# Patient Record
Sex: Female | Born: 1985 | Race: Asian | Hispanic: No | Marital: Single | Smoking: Never smoker
Health system: Southern US, Community
[De-identification: ages and names within clinical notes are randomized; demographics above are authoritative.]

## PROBLEM LIST (undated history)

## (undated) DIAGNOSIS — Z789 Other specified health status: Secondary | ICD-10-CM

## (undated) DIAGNOSIS — O24419 Gestational diabetes mellitus in pregnancy, unspecified control: Secondary | ICD-10-CM

## (undated) HISTORY — DX: Gestational diabetes mellitus in pregnancy, unspecified control: O24.419

## (undated) HISTORY — PX: BREAST SURGERY: SHX581

## (undated) HISTORY — DX: Other specified health status: Z78.9

---

## 2010-06-11 ENCOUNTER — Other Ambulatory Visit: Payer: Self-pay | Admitting: Infectious Diseases

## 2010-06-12 ENCOUNTER — Other Ambulatory Visit: Payer: Self-pay | Admitting: Infectious Diseases

## 2010-06-12 ENCOUNTER — Ambulatory Visit
Admission: RE | Admit: 2010-06-12 | Discharge: 2010-06-12 | Disposition: A | Discharge status: Home / Self Care | Payer: No Typology Code available for payment source | Source: Ambulatory Visit | Attending: Infectious Diseases | Admitting: Infectious Diseases

## 2010-06-12 DIAGNOSIS — R7611 Nonspecific reaction to tuberculin skin test without active tuberculosis: Secondary | ICD-10-CM

## 2010-06-12 IMAGING — CR DG CHEST 2V
2 series · 2 of 2 positions shown · non-contrast
Comparison: [DATE]

CLINICAL DATA: Positive PPD; history of abnormal chest x-ray

CHEST - 2 VIEW

[w chest pa]
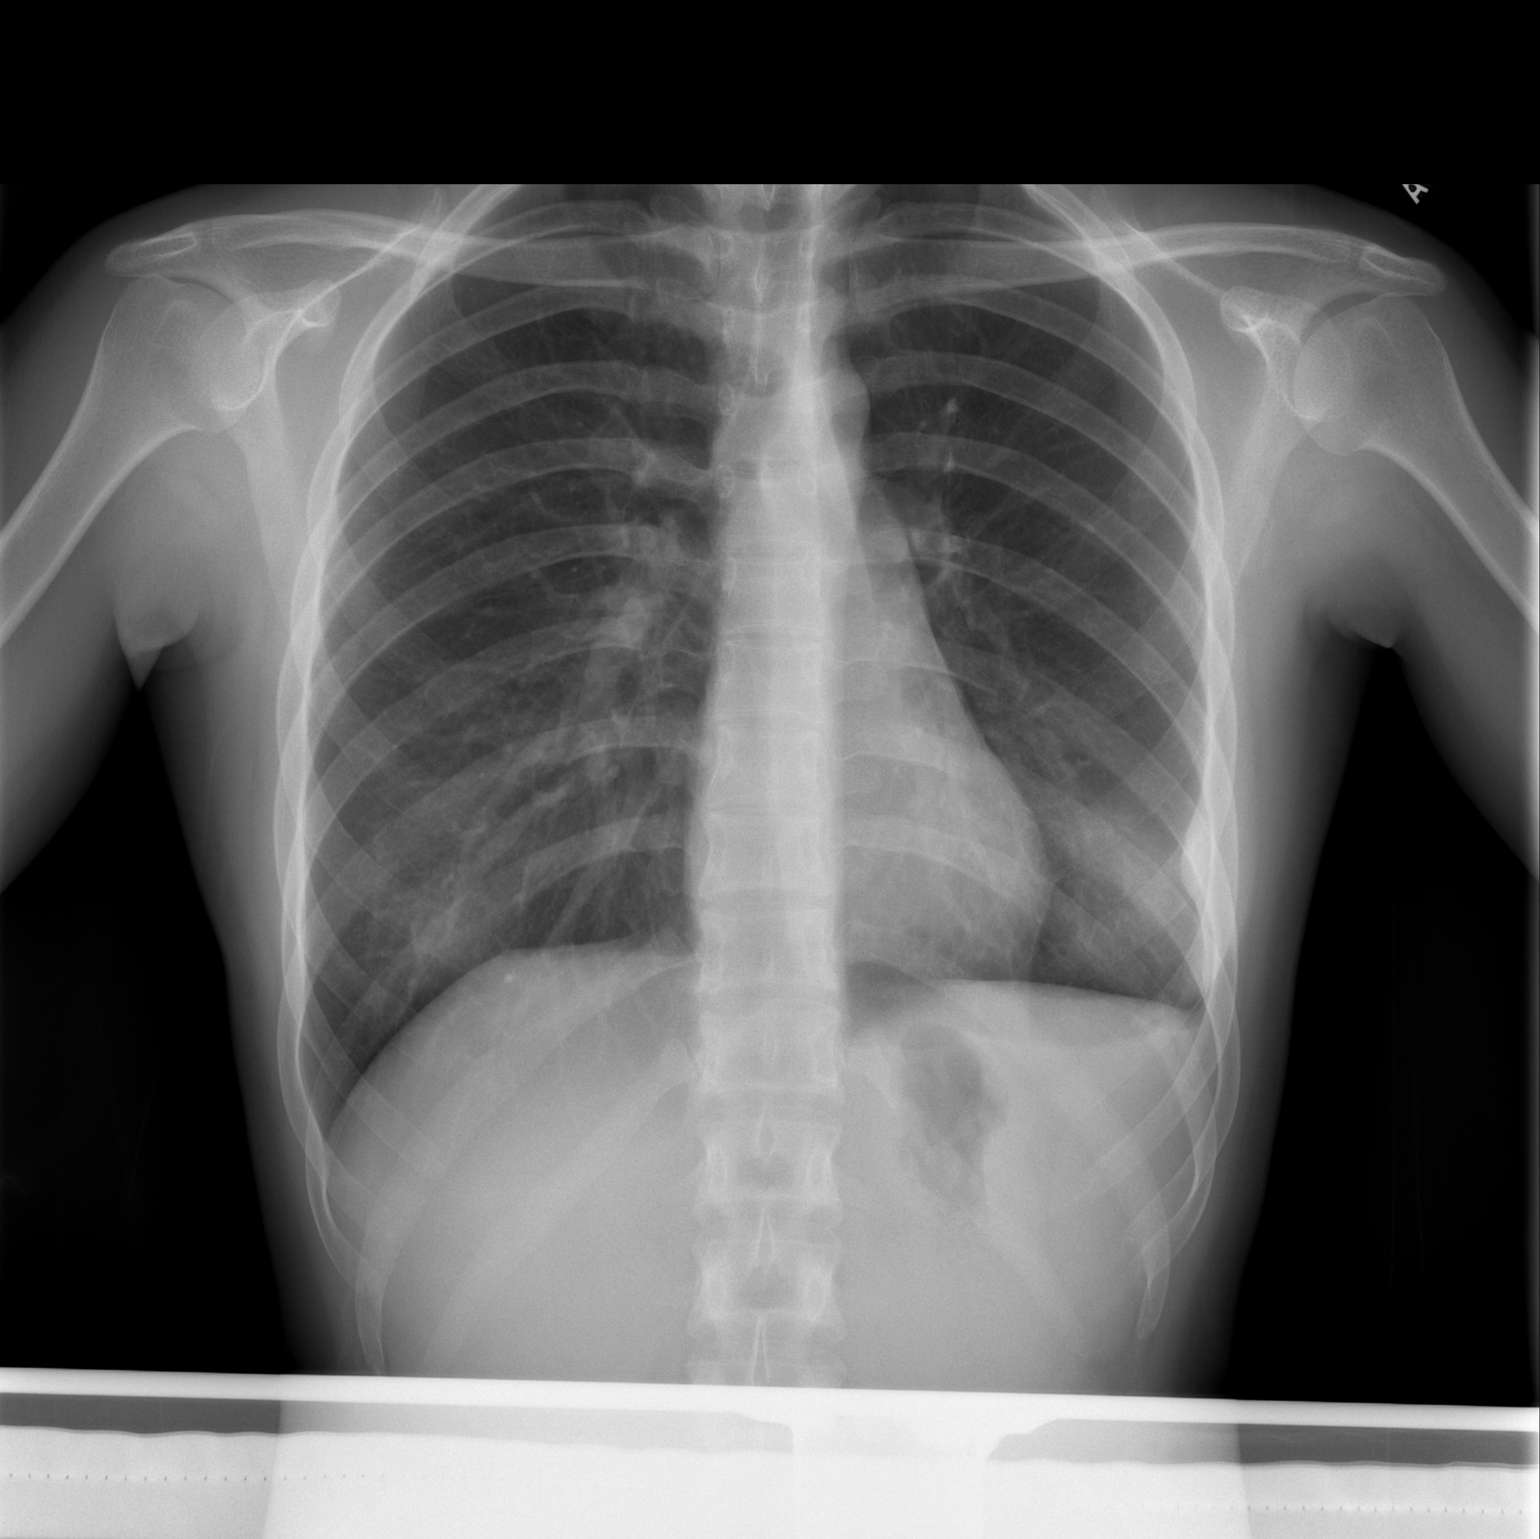

[w chest lat]
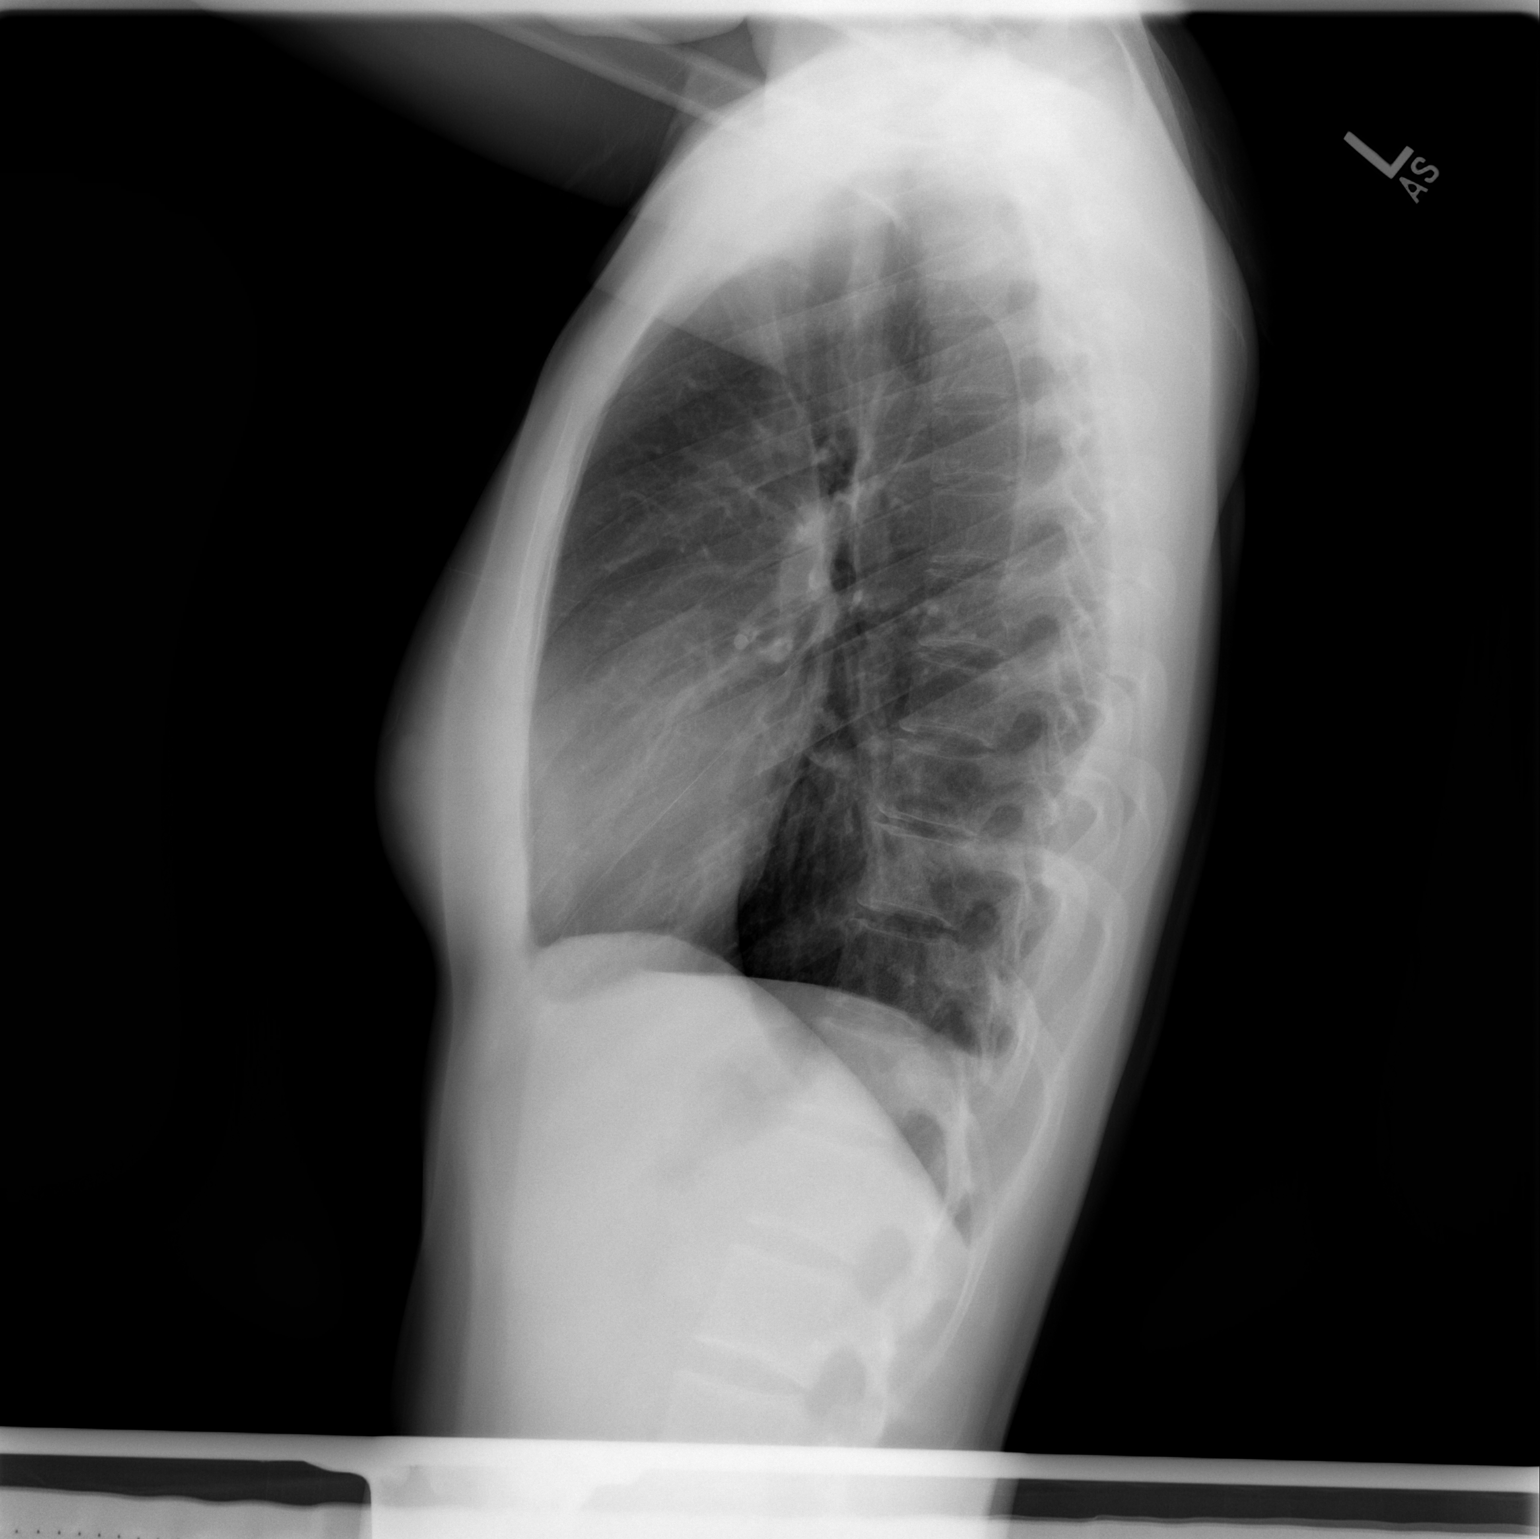

[2 of 2 positions shown; findings below may reference images not displayed]

FINDINGS: The cardiac silhouette, mediastinum, pulmonary
vasculature are within normal limits.  Both lungs are clear.  There
is minimal, stable blunting of the left costophrenic angle. There
is no acute bony abnormality.
IMPRESSION: There is no evidence of acute cardiac or pulmonary process.  The
stable blunting at the left costophrenic angle likely represents
chronic scarring.

## 2010-07-18 ENCOUNTER — Inpatient Hospital Stay (INDEPENDENT_AMBULATORY_CARE_PROVIDER_SITE_OTHER)
Admission: RE | Admit: 2010-07-18 | Discharge: 2010-07-18 | Disposition: A | Discharge status: Home / Self Care | Payer: Medicaid Other | Source: Ambulatory Visit | Attending: Family Medicine | Admitting: Family Medicine

## 2010-07-18 DIAGNOSIS — R6889 Other general symptoms and signs: Secondary | ICD-10-CM

## 2010-07-18 DIAGNOSIS — H9209 Otalgia, unspecified ear: Secondary | ICD-10-CM

## 2011-06-30 ENCOUNTER — Encounter (HOSPITAL_COMMUNITY): Payer: Self-pay | Admitting: *Deleted

## 2011-06-30 ENCOUNTER — Emergency Department (HOSPITAL_COMMUNITY): Payer: Self-pay

## 2011-06-30 ENCOUNTER — Emergency Department (HOSPITAL_COMMUNITY)
Admission: EM | Admit: 2011-06-30 | Discharge: 2011-06-30 | Disposition: A | Discharge status: Home / Self Care | Payer: Self-pay | Attending: Emergency Medicine | Admitting: Emergency Medicine

## 2011-06-30 DIAGNOSIS — R110 Nausea: Secondary | ICD-10-CM | POA: Insufficient documentation

## 2011-06-30 DIAGNOSIS — R51 Headache: Secondary | ICD-10-CM | POA: Insufficient documentation

## 2011-06-30 DIAGNOSIS — H53149 Visual discomfort, unspecified: Secondary | ICD-10-CM | POA: Insufficient documentation

## 2011-06-30 LAB — CBC
MCH: 30.2 pg (ref 26.0–34.0)
MCV: 91 fL (ref 78.0–100.0)
Platelets: 181 10*3/uL (ref 150–400)
RBC: 3.78 MIL/uL — ABNORMAL LOW (ref 3.87–5.11)
RDW: 12.1 % (ref 11.5–15.5)
WBC: 10.9 10*3/uL — ABNORMAL HIGH (ref 4.0–10.5)

## 2011-06-30 LAB — DIFFERENTIAL
Basophils Absolute: 0 10*3/uL (ref 0.0–0.1)
Eosinophils Absolute: 0.1 10*3/uL (ref 0.0–0.7)
Eosinophils Relative: 1 % (ref 0–5)
Lymphocytes Relative: 24 % (ref 12–46)
Lymphs Abs: 2.6 10*3/uL (ref 0.7–4.0)
Neutrophils Relative %: 69 % (ref 43–77)

## 2011-06-30 LAB — BASIC METABOLIC PANEL
Calcium: 8.6 mg/dL (ref 8.4–10.5)
GFR calc non Af Amer: >90 mL/min (ref >90)
Potassium: 3.4 mEq/L — ABNORMAL LOW (ref 3.5–5.1)
Sodium: 138 mEq/L (ref 135–145)

## 2011-06-30 IMAGING — CT CT HEAD W/O CM
1 series · 16 of 30 positions shown, 20 images · non-contrast
Comparison: None

CLINICAL DATA: 25-year-old female with headache.

CT HEAD WITHOUT CONTRAST
TECHNIQUE: Contiguous axial images were obtained from the base of
the skull through the vertex without contrast.

[Series 2: head routine 4.8 h37s · axial · 0.42mm/px · z∈[-124,+9]mm · 16 of 30 slices shown, 20 images]
[im 2/30  brain]
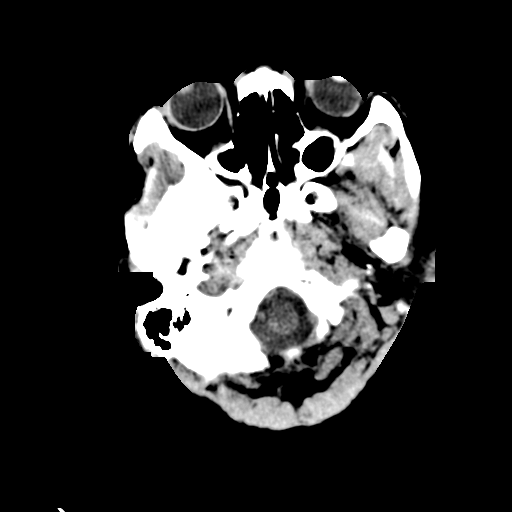
[im 2/30  bone]
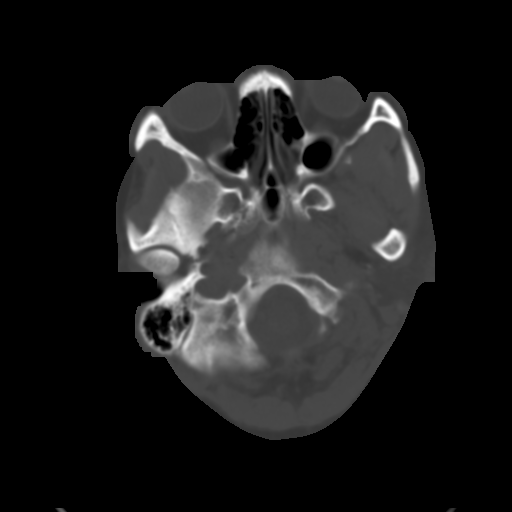
[im 4/30  brain]
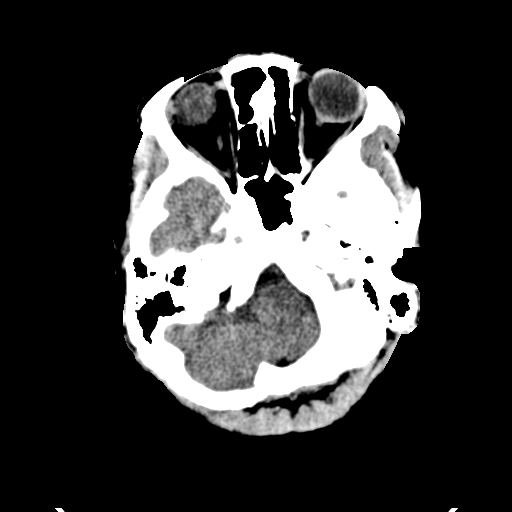
[im 6/30  brain]
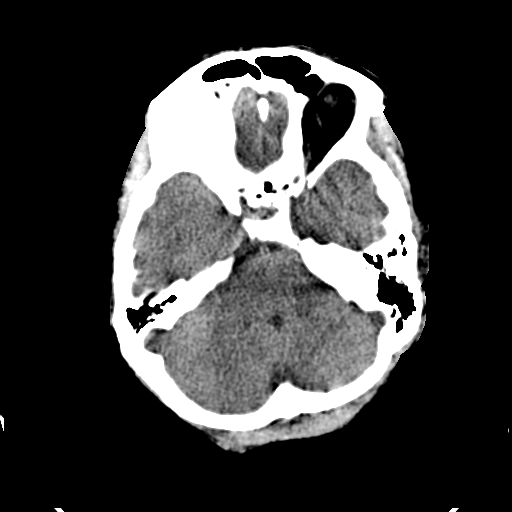
[im 8/30  brain]
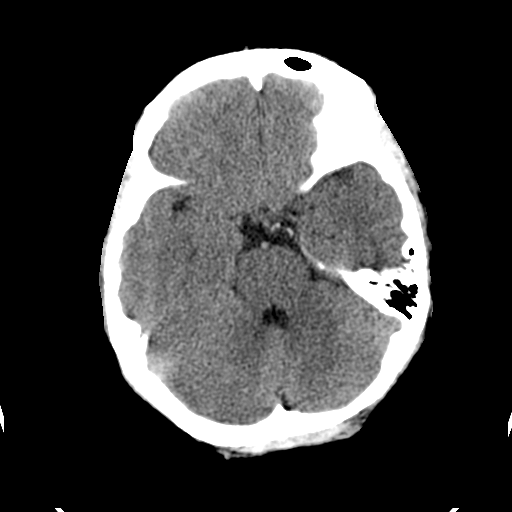
[im 9/30  brain]
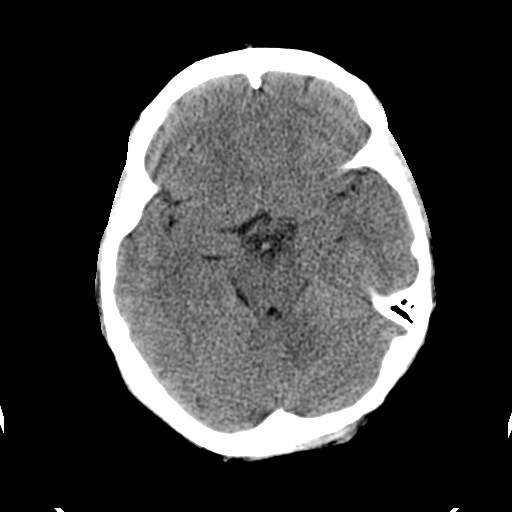
[im 9/30  bone]
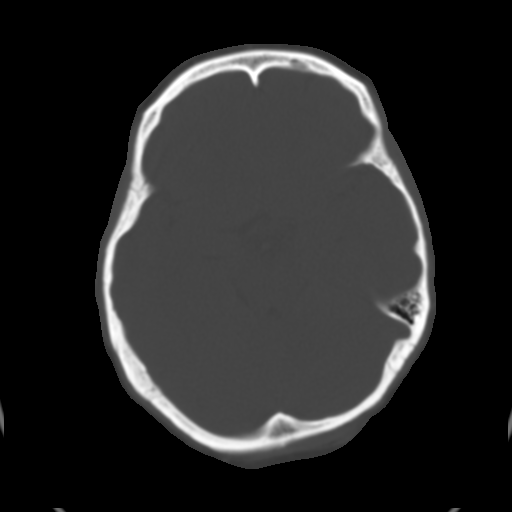
[im 11/30  brain]
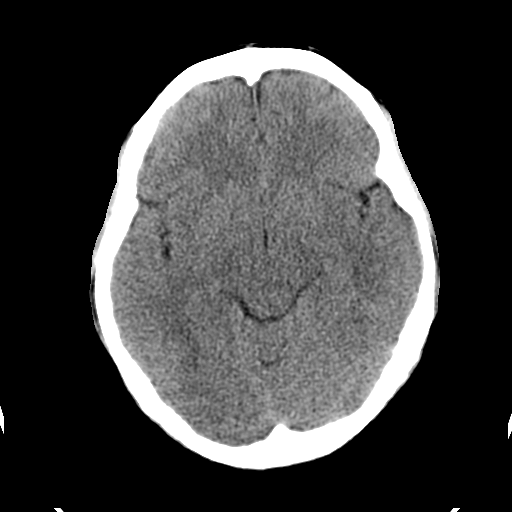
[im 13/30  brain]
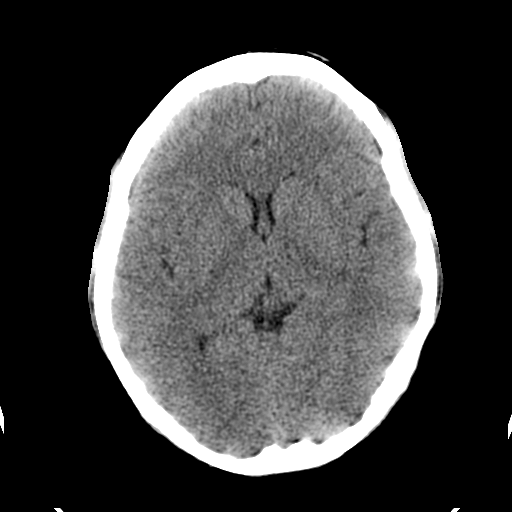
[im 15/30  brain]
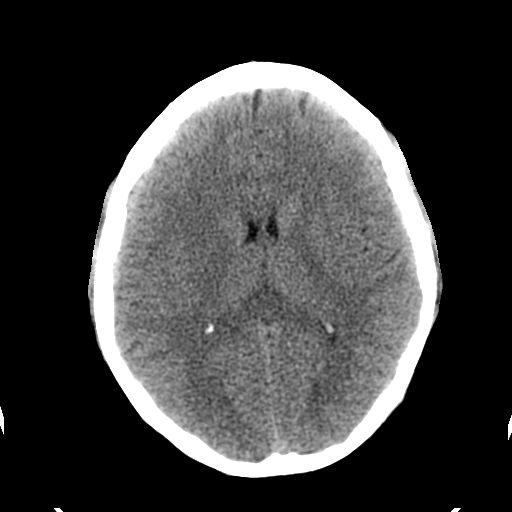
[im 16/30  brain]
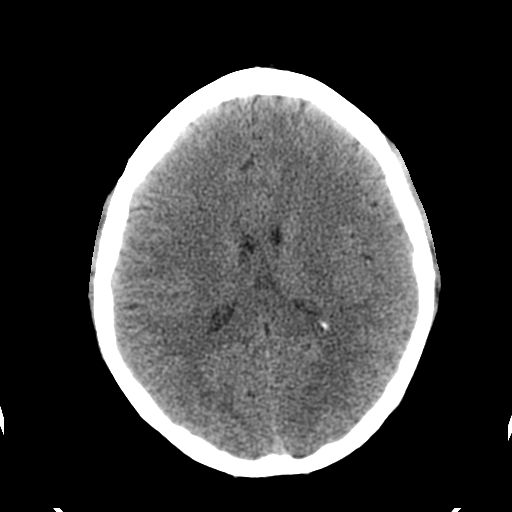
[im 16/30  bone]
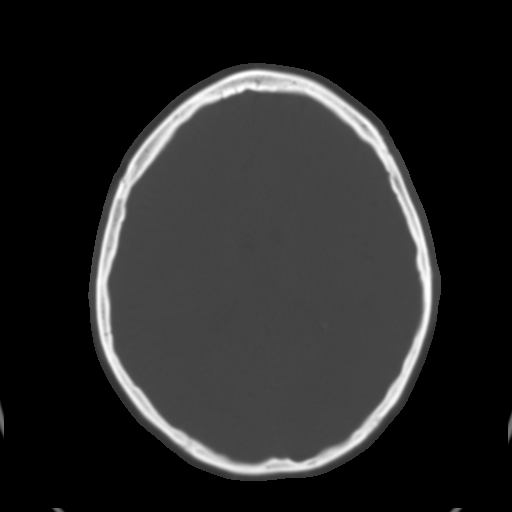
[im 18/30  brain]
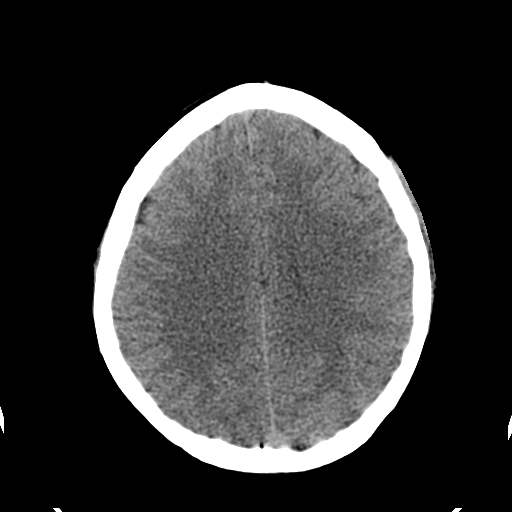
[im 20/30  brain]
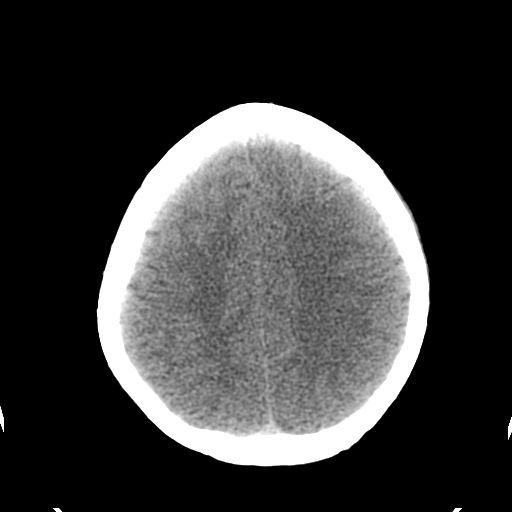
[im 22/30  brain]
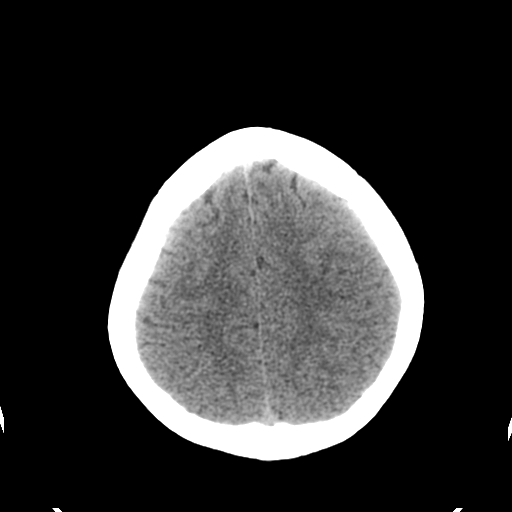
[im 23/30  brain]
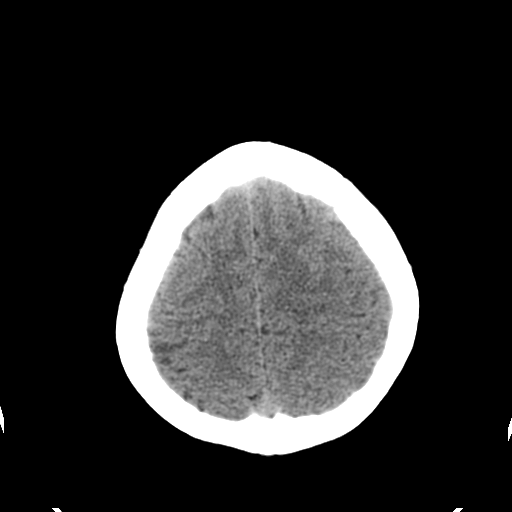
[im 23/30  bone]
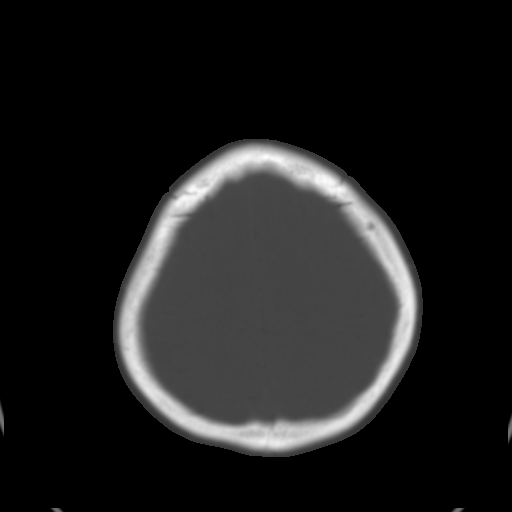
[im 25/30  brain]
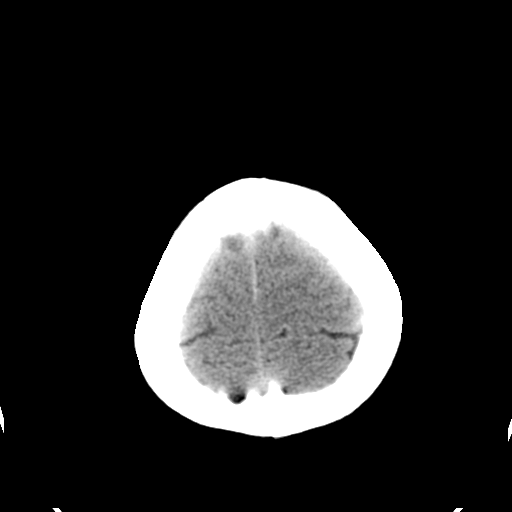
[im 27/30  brain]
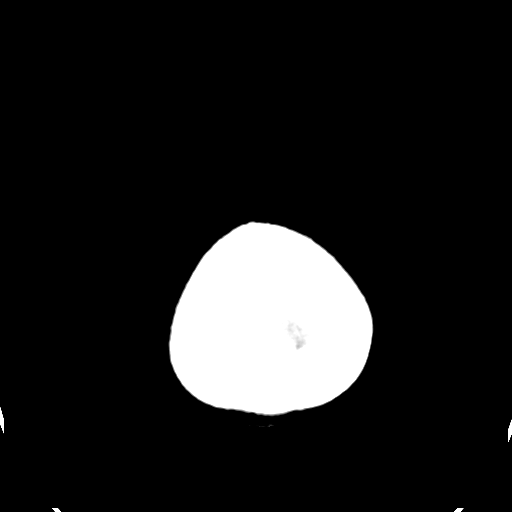
[im 29/30  brain]
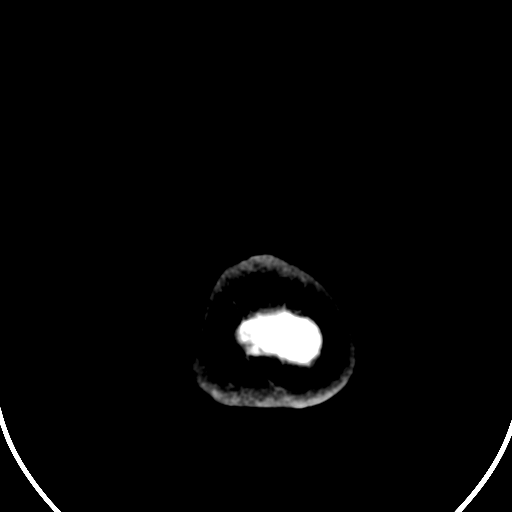

[16 of 30 positions shown; findings below may reference images not displayed]

FINDINGS: No intracranial abnormalities are identified, including
mass lesion or mass effect, hydrocephalus, extra-axial fluid
collection, midline shift, hemorrhage, or acute infarction.

The visualized bony calvarium is unremarkable.
IMPRESSION: Unremarkable noncontrast head CT

## 2011-06-30 MED ORDER — KETOROLAC TROMETHAMINE 30 MG/ML IJ SOLN
30.0000 mg | Freq: Once | INTRAMUSCULAR | Status: AC
  Administered 2011-06-30: 30 mg via INTRAVENOUS
  Filled 2011-06-30: qty 1

## 2011-06-30 MED ORDER — SODIUM CHLORIDE 0.9 % IV BOLUS (SEPSIS)
1000.0000 mL | Freq: Once | INTRAVENOUS | Status: AC
  Administered 2011-06-30: 1000 mL via INTRAVENOUS

## 2011-06-30 MED ORDER — SODIUM CHLORIDE 0.9 % IV BOLUS (SEPSIS): 1000.0000 mL | Freq: Once | INTRAVENOUS | Status: DC

## 2011-06-30 MED ORDER — NAPROXEN 375 MG PO TABS: 375.0000 mg | ORAL_TABLET | Freq: Two times a day (BID) | ORAL | Status: AC

## 2011-06-30 MED ORDER — DIPHENHYDRAMINE HCL 50 MG/ML IJ SOLN
25.0000 mg | Freq: Once | INTRAMUSCULAR | Status: AC
  Administered 2011-06-30: 25 mg via INTRAVENOUS
  Filled 2011-06-30: qty 1

## 2011-06-30 MED ORDER — METOCLOPRAMIDE HCL 5 MG/ML IJ SOLN
10.0000 mg | Freq: Once | INTRAMUSCULAR | Status: AC
  Administered 2011-06-30: 10 mg via INTRAVENOUS
  Filled 2011-06-30: qty 2

## 2011-06-30 NOTE — ED Provider Notes (Signed)
History     CSN: [id number]  Arrival date & time 06/30/11  0226   First MD Initiated Contact with Patient 06/30/11 [phone]      Chief Complaint  Patient presents with  . Headache    (Consider location/radiation/quality/duration/timing/severity/associated sxs/prior treatment) HPI Comments: Patient with no significant medical history presents emergency department with chief complaint of headache.  Patient does not normally suffer headaches onset of headache was yesterday at around 4 p.m. in the afternoon and was gradual in progression.  Pain is located in in temporal regions as well as cerebellar regions.  Severity rated as 10 out of 10 associated symptoms including photophobia, nausea but patient denies change in vision, ataxia, disequilibrium, weakness, vomiting, nuchal rigidity, recent illness,URI,cough, fever, night sweats or chills. Pt is from [place], but has been in the [place] for > 1 year with out recent travel. Translation was done by pts aunt.   Patient is a 26 y.o. female presenting with headaches. The history is provided by the patient, a parent and a relative. The history is limited by a language barrier. A language interpreter was used (family member).  Headache  Pertinent negatives include no fever and no shortness of breath.    History reviewed. No pertinent past medical history.  History reviewed. No pertinent past surgical history.  No family history on file.  History  Substance Use Topics  . Smoking status: Never Smoker   . Smokeless tobacco: Not on file  . Alcohol Use: No    OB History    Grav Para Term Preterm Abortions TAB SAB Ect Mult Living                  Review of Systems  Constitutional: Negative for fever, chills and appetite change.  HENT: Negative for congestion, facial swelling, neck pain and neck stiffness.   Eyes: Positive for photophobia. Negative for pain and visual disturbance.  Respiratory: Negative for shortness of breath.   Cardiovascular:  Negative for chest pain and leg swelling.  Gastrointestinal: Negative for abdominal pain.  Genitourinary: Negative for dysuria, urgency and frequency.  Neurological: Positive for headaches. Negative for dizziness, syncope, weakness, light-headedness and numbness.  Psychiatric/Behavioral: Negative for confusion.  All other systems reviewed and are negative.    Allergies  Review of patient's allergies indicates no known allergies.  Home Medications   Current Outpatient Rx  Name Route Sig Dispense Refill  . ACETAMINOPHEN 500 MG PO TABS Oral Take 1,000 mg by mouth every 6 (six) hours as needed. For pain      BP 88/58  Pulse 96  Temp(Src) 97.4 F (36.3 C) (Oral)  Resp 16  SpO2 95%  Physical Exam  Nursing note and vitals reviewed. Constitutional: She is oriented to person, place, and time. She appears well-developed and well-nourished. No distress.  HENT:  Head: Normocephalic and atraumatic.  Eyes: Conjunctivae and EOM are normal. Pupils are equal, round, and reactive to light. No scleral icterus.  Neck: Normal range of motion and full passive range of motion without pain. Neck supple. No JVD present. Carotid bruit is not present. No rigidity. No Brudzinski's sign noted.  Cardiovascular: Normal rate, regular rhythm, normal heart sounds and intact distal pulses.   Pulmonary/Chest: Effort normal and breath sounds normal. No respiratory distress. She has no wheezes. She has no rales.  Musculoskeletal: Normal range of motion.  Lymphadenopathy:    She has no cervical adenopathy.  Neurological: She is alert and oriented to person, place, and time. She has normal strength.  No cranial nerve deficit or sensory deficit. She displays a negative Romberg sign. Coordination and gait normal. GCS eye subscore is 4. GCS verbal subscore is 5. GCS motor subscore is 6.       A&O x3.  Able to follow commands. PERRL, EOMs, no vertical or bidirectional nystagmus. Shoulder shrug, facial muscles, tongue  protrusion and swallow intact.  Motor strength 5/5 bilaterally including grip strength, triceps, hamstrings and ankle dorsiflexion. Light touch intact in all 4 distal limbs.  Intact finger to nose, shin to heel and rapid alternating movements. No ataxia or dysequilibrium.   Skin: Skin is warm and dry. No rash noted. She is not diaphoretic.  Psychiatric: She has a normal mood and affect. Her behavior is normal.    ED Course  Procedures (including critical care time)  Labs Reviewed  CBC - Abnormal; Notable for the following:    WBC 10.9 (*)    RBC 3.78 (*)    Hemoglobin 11.4 (*)    HCT 34.4 (*)    All other components within normal limits  BASIC METABOLIC PANEL - Abnormal; Notable for the following:    Potassium 3.4 (*)    All other components within normal limits  DIFFERENTIAL   Ct Head Wo Contrast  06/30/2011  *RADIOLOGY REPORT*  Clinical Data: 26 year old female with headache.  CT HEAD WITHOUT CONTRAST  Technique:  Contiguous axial images were obtained from the base of the skull through the vertex without contrast.  Comparison: None  Findings: No intracranial abnormalities are identified, including mass lesion or mass effect, hydrocephalus, extra-axial fluid collection, midline shift, hemorrhage, or acute infarction.  The visualized bony calvarium is unremarkable.  IMPRESSION: Unremarkable noncontrast head CT  Original Report Authenticated By: Rosendo Gros, M.D.     No diagnosis found.    MDM  HA  Patient to the emergency department with chief complaint of headache associated with photophobia and nausea but no recent illness or meningeal signs.  Patient given strict instructions to return to the emergency department if fever greater than 101, worsening headache, or any nuchal rigidity develops. Pt HA treated and improved while in ED.  Presentation is non concerning for [facility], ICH, Meningitis, or temporal arteritis. Pt is afebrile with no focal neuro deficits, nuchal rigidity, or  change in vision. Pt is to follow up with PCP. Pt verbalizes understanding and is agreeable with plan to dc.          Jaci Carrel, [place] 06/30/11 1030

## 2011-06-30 NOTE — Discharge Instructions (Signed)

## 2011-06-30 NOTE — ED Notes (Signed)
The pt has had a headache since last pm

## 2011-07-01 NOTE — ED Provider Notes (Signed)
Medical screening examination/treatment/procedure(s) were performed by non-physician practitioner and as supervising physician I was immediately available for consultation/collaboration.   Vida Roller, MD 07/01/11 [phone]

## 2013-12-18 ENCOUNTER — Emergency Department (HOSPITAL_COMMUNITY): Payer: Self-pay

## 2013-12-18 ENCOUNTER — Observation Stay (HOSPITAL_COMMUNITY)
Admission: EM | Admit: 2013-12-18 | Discharge: 2013-12-20 | Disposition: A | Discharge status: Home / Self Care | Payer: Medicaid Other | Attending: Internal Medicine | Admitting: Internal Medicine

## 2013-12-18 ENCOUNTER — Encounter (HOSPITAL_COMMUNITY): Payer: Self-pay | Admitting: Emergency Medicine

## 2013-12-18 DIAGNOSIS — N760 Acute vaginitis: Secondary | ICD-10-CM | POA: Insufficient documentation

## 2013-12-18 DIAGNOSIS — R112 Nausea with vomiting, unspecified: Secondary | ICD-10-CM | POA: Diagnosis present

## 2013-12-18 DIAGNOSIS — R4189 Other symptoms and signs involving cognitive functions and awareness: Secondary | ICD-10-CM

## 2013-12-18 DIAGNOSIS — R4182 Altered mental status, unspecified: Secondary | ICD-10-CM

## 2013-12-18 DIAGNOSIS — I951 Orthostatic hypotension: Secondary | ICD-10-CM | POA: Insufficient documentation

## 2013-12-18 DIAGNOSIS — Z681 Body mass index (BMI) 19 or less, adult: Secondary | ICD-10-CM | POA: Insufficient documentation

## 2013-12-18 DIAGNOSIS — R401 Stupor: Secondary | ICD-10-CM

## 2013-12-18 DIAGNOSIS — B9689 Other specified bacterial agents as the cause of diseases classified elsewhere: Secondary | ICD-10-CM | POA: Diagnosis present

## 2013-12-18 DIAGNOSIS — E440 Moderate protein-calorie malnutrition: Secondary | ICD-10-CM | POA: Insufficient documentation

## 2013-12-18 DIAGNOSIS — R5383 Other fatigue: Secondary | ICD-10-CM | POA: Insufficient documentation

## 2013-12-18 DIAGNOSIS — N925 Other specified irregular menstruation: Secondary | ICD-10-CM | POA: Insufficient documentation

## 2013-12-18 DIAGNOSIS — G9340 Encephalopathy, unspecified: Principal | ICD-10-CM | POA: Insufficient documentation

## 2013-12-18 DIAGNOSIS — R410 Disorientation, unspecified: Secondary | ICD-10-CM

## 2013-12-18 DIAGNOSIS — R1013 Epigastric pain: Secondary | ICD-10-CM | POA: Insufficient documentation

## 2013-12-18 DIAGNOSIS — N926 Irregular menstruation, unspecified: Secondary | ICD-10-CM | POA: Diagnosis present

## 2013-12-18 LAB — C-REACTIVE PROTEIN: CRP: <0.5 mg/dL — ABNORMAL LOW (ref <0.60)

## 2013-12-18 LAB — URINALYSIS, ROUTINE W REFLEX MICROSCOPIC
Bilirubin Urine: NEGATIVE
Glucose, UA: NEGATIVE mg/dL
Hgb urine dipstick: NEGATIVE
Ketones, ur: 15 mg/dL — AB
Nitrite: NEGATIVE
Protein, ur: NEGATIVE mg/dL
Specific Gravity, Urine: 1.017 (ref 1.005–1.030)
Urobilinogen, UA: 0.2 mg/dL (ref 0.0–1.0)
pH: 5.5 (ref 5.0–8.0)

## 2013-12-18 LAB — BASIC METABOLIC PANEL
Anion gap: 13 (ref 5–15)
BUN: 10 mg/dL (ref 6–23)
CALCIUM: 9.8 mg/dL (ref 8.4–10.5)
CO2: 23 mEq/L (ref 19–32)
CREATININE: 0.61 mg/dL (ref 0.50–1.10)
Chloride: 100 mEq/L (ref 96–112)
Glucose, Bld: 110 mg/dL — ABNORMAL HIGH (ref 70–99)
Potassium: 3.7 mEq/L (ref 3.7–5.3)
Sodium: 136 mEq/L — ABNORMAL LOW (ref 137–147)

## 2013-12-18 LAB — I-STAT TROPONIN, ED: TROPONIN I, POC: 0 ng/mL (ref 0.00–0.08)

## 2013-12-18 LAB — CBC
HEMATOCRIT: 41.4 % (ref 36.0–46.0)
Hemoglobin: 14.2 g/dL (ref 12.0–15.0)
MCH: 30.1 pg (ref 26.0–34.0)
MCHC: 34.3 g/dL (ref 30.0–36.0)
MCV: 87.7 fL (ref 78.0–100.0)
Platelets: 214 10*3/uL (ref 150–400)
RBC: 4.72 MIL/uL (ref 3.87–5.11)
RDW: 12 % (ref 11.5–15.5)
WBC: 10.1 10*3/uL (ref 4.0–10.5)

## 2013-12-18 LAB — I-STAT ARTERIAL BLOOD GAS, ED
Acid-Base Excess: 1 mmol/L (ref 0.0–2.0)
Bicarbonate: 25.9 mEq/L — ABNORMAL HIGH (ref 20.0–24.0)
O2 Saturation: 97 %
PCO2 ART: 41 mmHg (ref 35.0–45.0)
PH ART: 7.406 (ref 7.350–7.450)
PO2 ART: 89 mmHg (ref 80.0–100.0)
Patient temperature: 97.4
TCO2: 27 mmol/L (ref 0–100)

## 2013-12-18 LAB — HEPATIC FUNCTION PANEL
ALT: 6 U/L (ref 0–35)
AST: 12 U/L (ref 0–37)
Albumin: 4.2 g/dL (ref 3.5–5.2)
Alkaline Phosphatase: 57 U/L (ref 39–117)
BILIRUBIN TOTAL: 0.5 mg/dL (ref 0.3–1.2)
Total Protein: 8 g/dL (ref 6.0–8.3)

## 2013-12-18 LAB — CBG MONITORING, ED: GLUCOSE-CAPILLARY: 102 mg/dL — AB (ref 70–99)

## 2013-12-18 LAB — ACETAMINOPHEN LEVEL: Acetaminophen (Tylenol), Serum: <15 ug/mL (ref 10–30)

## 2013-12-18 LAB — URINE MICROSCOPIC-ADD ON

## 2013-12-18 LAB — SEDIMENTATION RATE: Sed Rate: 6 mm/hr (ref 0–22)

## 2013-12-18 LAB — RAPID URINE DRUG SCREEN, HOSP PERFORMED
Amphetamines: NOT DETECTED
BARBITURATES: NOT DETECTED
Benzodiazepines: NOT DETECTED
COCAINE: NOT DETECTED
OPIATES: NOT DETECTED
Tetrahydrocannabinol: NOT DETECTED

## 2013-12-18 LAB — PRO B NATRIURETIC PEPTIDE: PRO B NATRI PEPTIDE: 11.7 pg/mL (ref 0–125)

## 2013-12-18 LAB — LIPASE, BLOOD: Lipase: 114 U/L — ABNORMAL HIGH (ref 11–59)

## 2013-12-18 LAB — SALICYLATE LEVEL: Salicylate Lvl: <2 mg/dL — ABNORMAL LOW (ref 2.8–20.0)

## 2013-12-18 LAB — TSH: TSH: 0.787 u[IU]/mL (ref 0.350–4.500)

## 2013-12-18 LAB — ETHANOL

## 2013-12-18 IMAGING — CT CT HEAD W/O CM
2 series · 16 of 30 positions shown, 18 images · non-contrast
Comparison: [DATE]

CLINICAL DATA: Lethargy, altered mental status, nonverbal

EXAM:
CT HEAD WITHOUT CONTRAST
TECHNIQUE: Contiguous axial images were obtained from the base of the skull
through the vertex without intravenous contrast.

[Series 201: head w/o, idose (1) · axial · non-contrast · 0.44mm/px · z∈[+258,+373]mm · 8 of 31 slices shown, 10 images]
[im 4/31  brain]
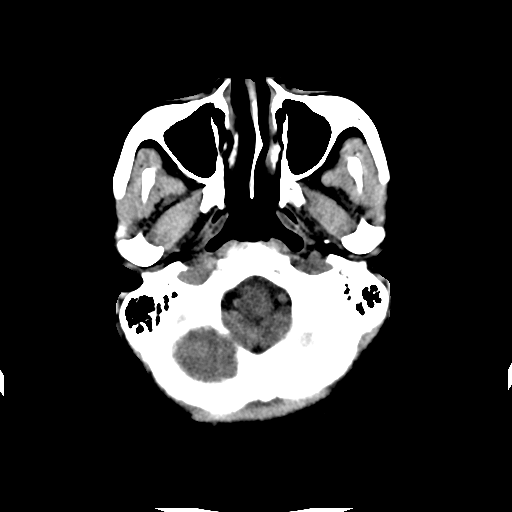
[im 4/31  bone]
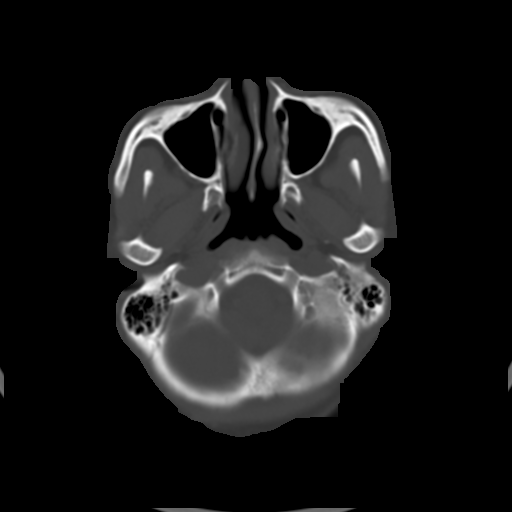
[im 7/31  brain]
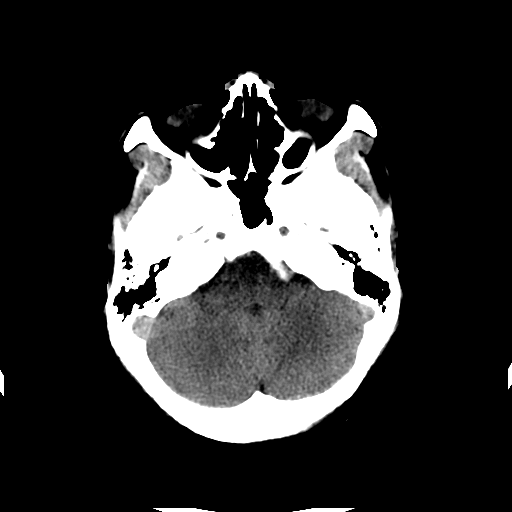
[im 11/31  brain]
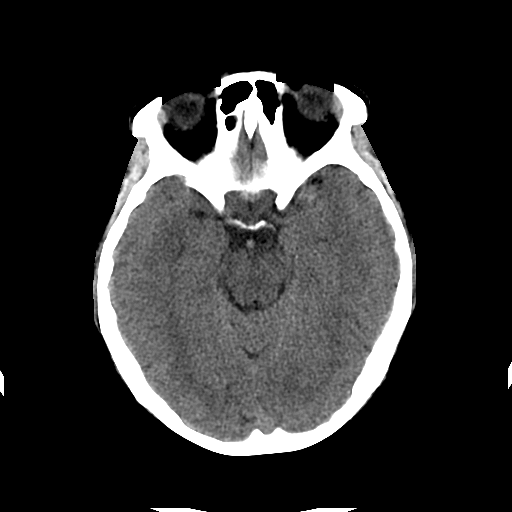
[im 14/31  brain]
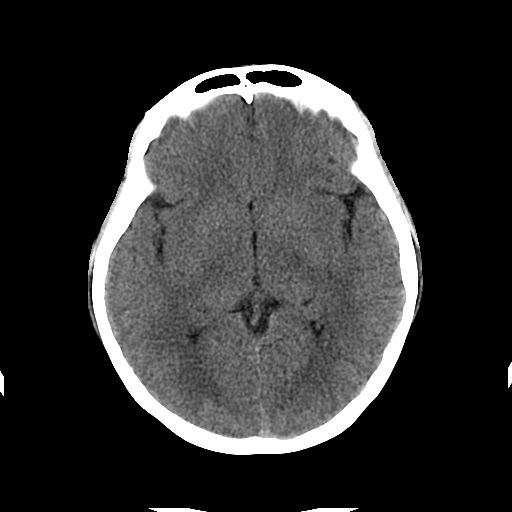
[im 17/31  brain]
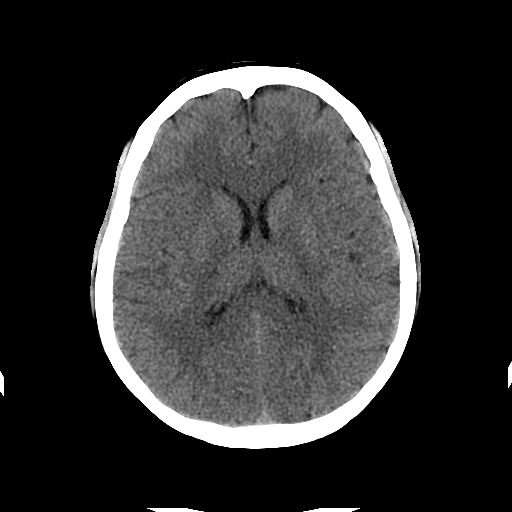
[im 17/31  bone]
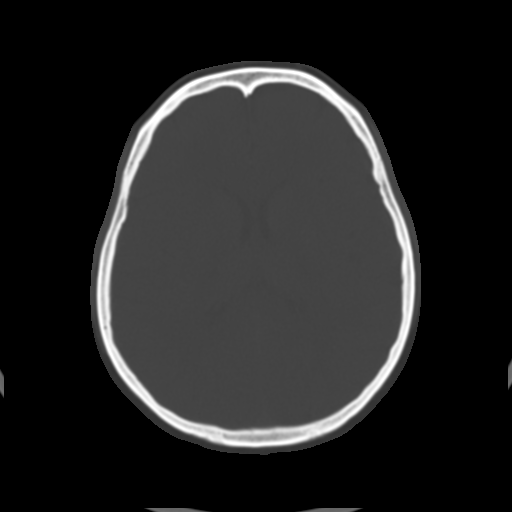
[im 21/31  brain]
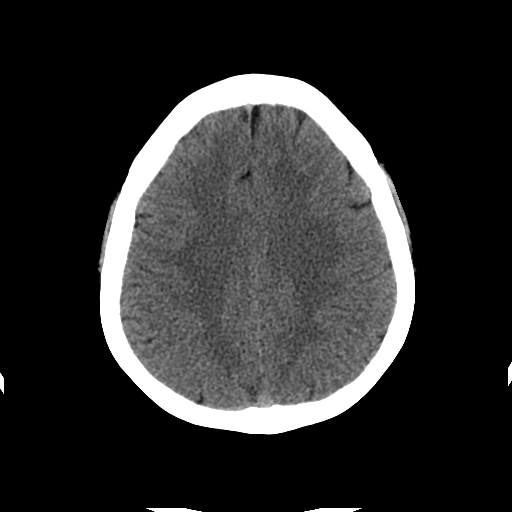
[im 24/31  brain]
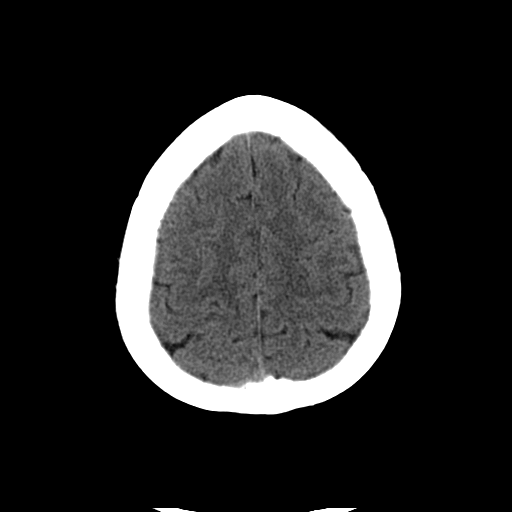
[im 27/31  brain]
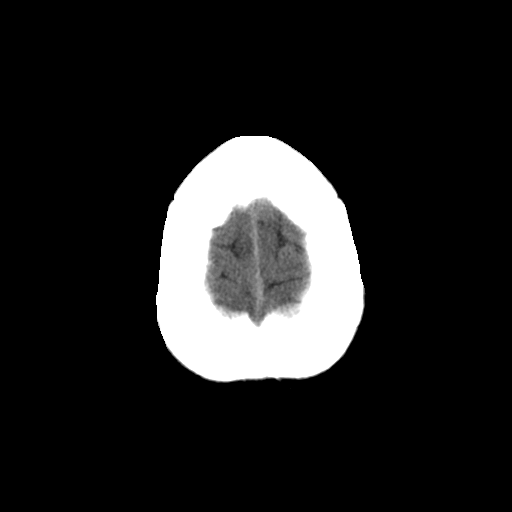

[Series 202: head w/o bone, idose (1) · axial · non-contrast · 0.44mm/px · z∈[+257,+377]mm · 8 of 62 slices shown]
[im 7/62  bone]
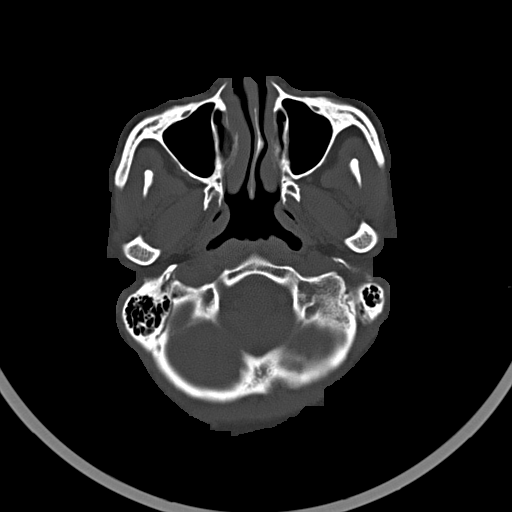
[im 13/62  bone]
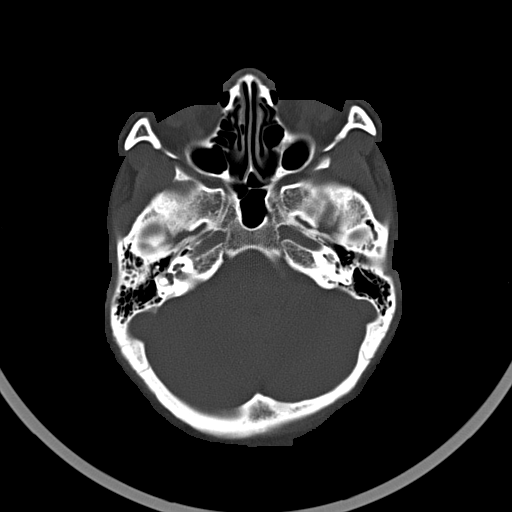
[im 20/62  bone]
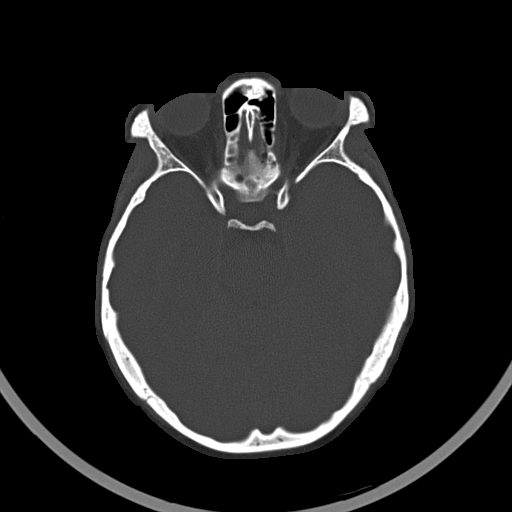
[im 26/62  bone]
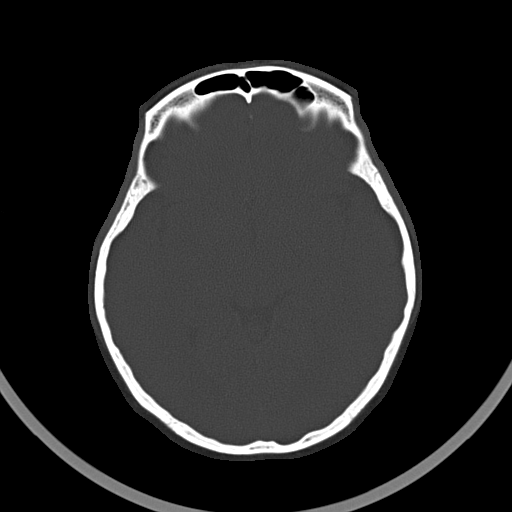
[im 36/62  bone]
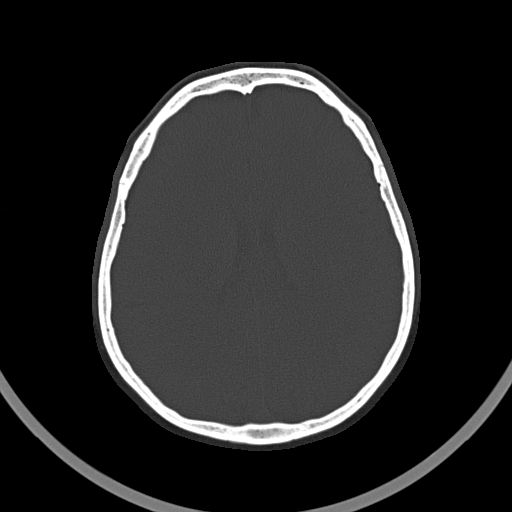
[im 42/62  bone]
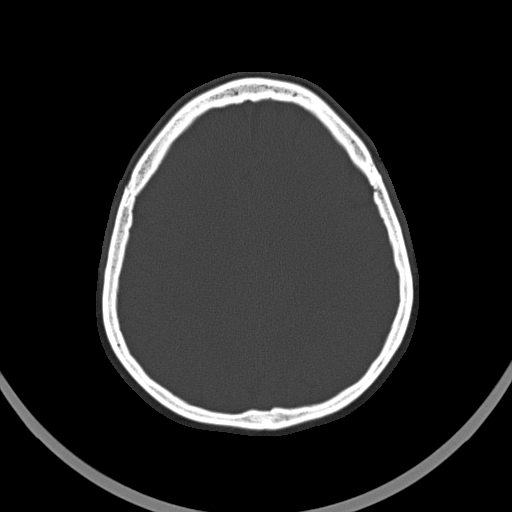
[im 49/62  bone]
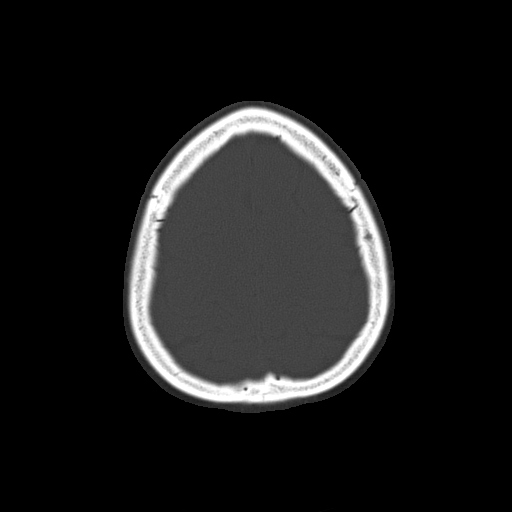
[im 55/62  bone]
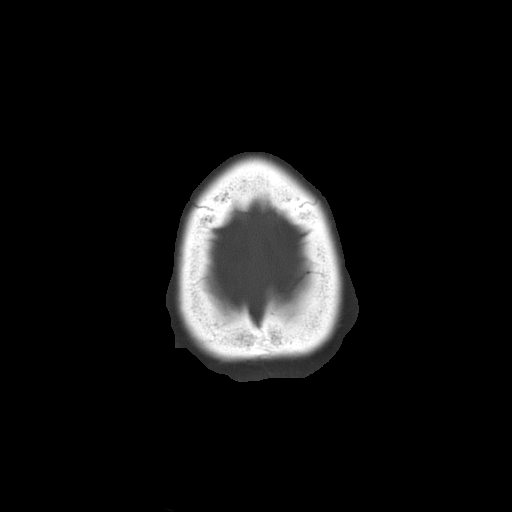

[16 of 30 positions shown; findings below may reference images not displayed]

FINDINGS: There is no evidence of acute intracranial hemorrhage, brain edema,
mass lesion, acute infarction, mass effect, or midline shift. Acute
infarct may be inapparent on noncontrast CT. No other intra-axial
abnormalities are seen, and the ventricles and sulci are within
normal limits in size and symmetry. No abnormal extra-axial fluid
collections or masses are identified. No significant calvarial
abnormality.
IMPRESSION: 1. Negative for bleed or other acute intracranial process.

## 2013-12-18 IMAGING — CR DG CHEST 1V PORT
1 series · 1 of 1 positions shown · non-contrast
Comparison: PA and lateral chest x-ray [DATE]

CLINICAL DATA: Two days of chest pain; postprandial vomiting;
unresponsive since this morning. ; no history of tobacco use;
initial visit

EXAM:
PORTABLE CHEST - 1 VIEW

[AP]
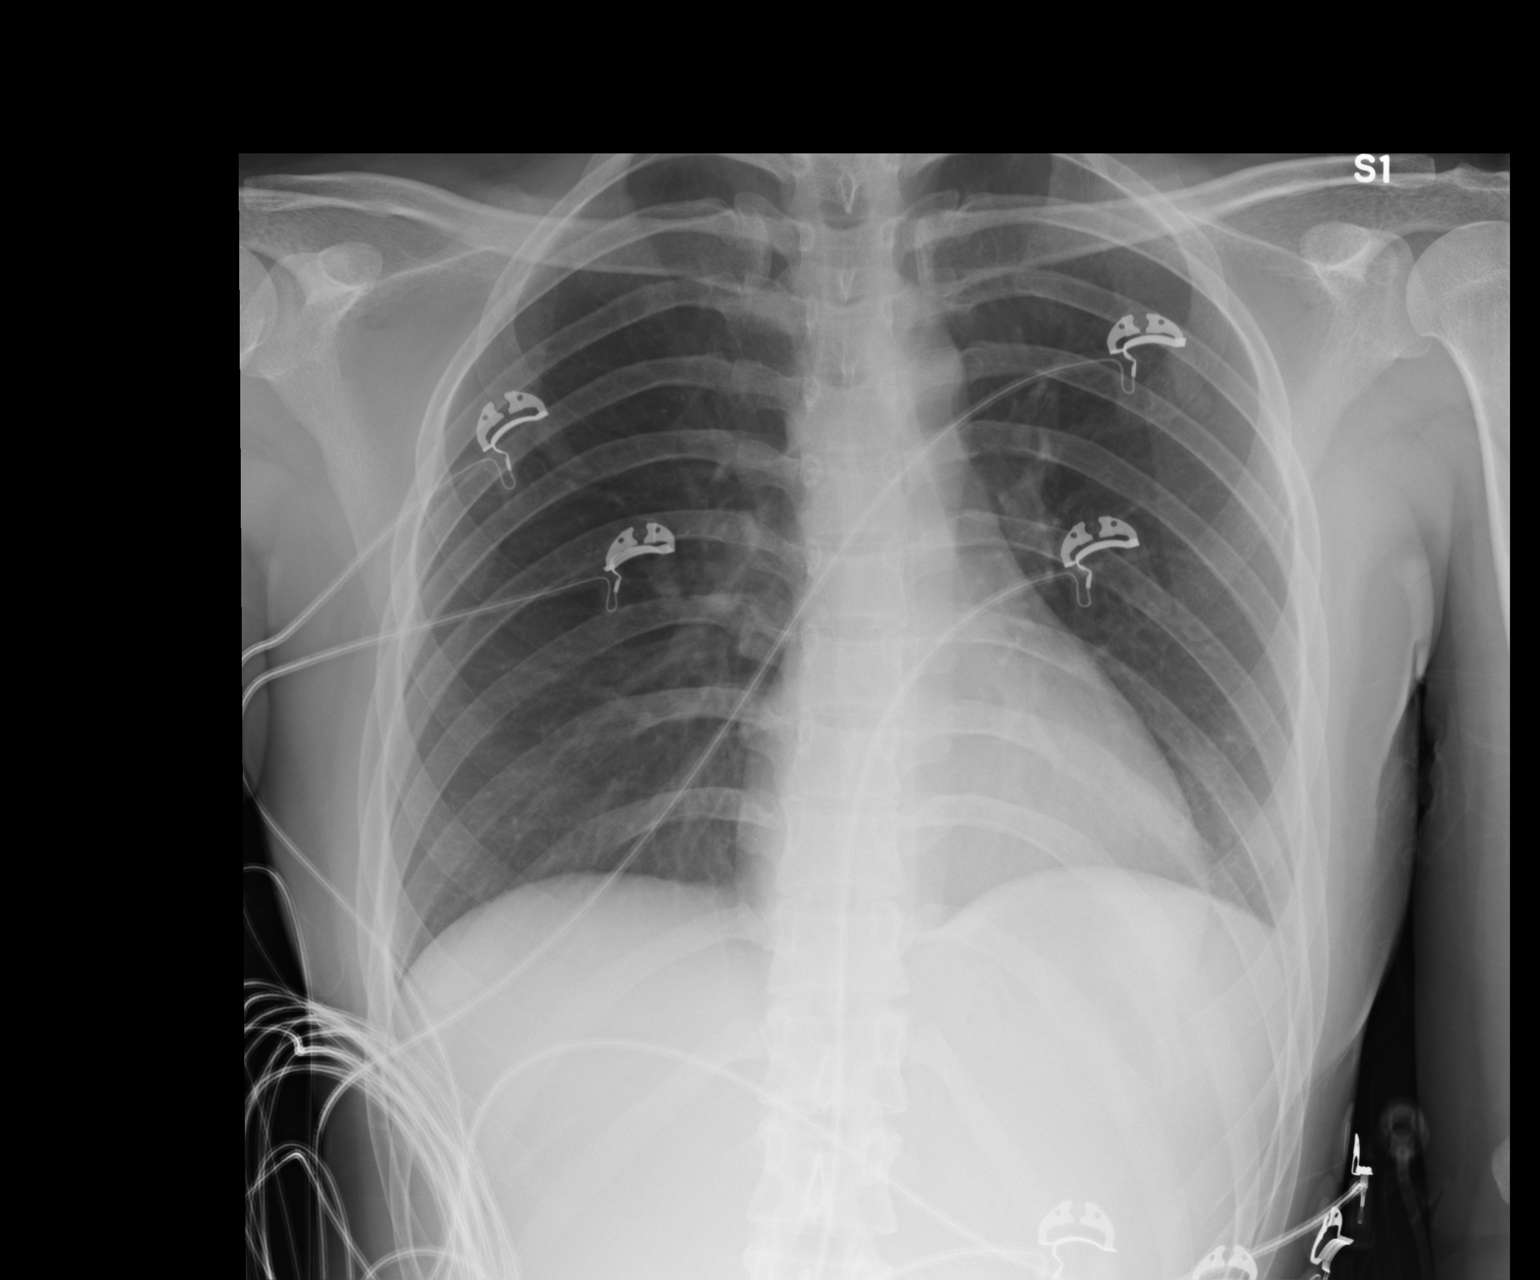

[1 of 1 positions shown; findings below may reference images not displayed]

FINDINGS: The lungs are well-expanded and clear. The heart and mediastinal
structures are normal. The pulmonary vascularity is not engorged.
There is no pleural effusion or pneumothorax. The bony thorax is
unremarkable.
IMPRESSION: There is no acute cardiopulmonary abnormality.

## 2013-12-18 MED ORDER — IBUPROFEN 400 MG PO TABS
400.0000 mg | ORAL_TABLET | Freq: Four times a day (QID) | ORAL | Status: DC | PRN
  Filled 2013-12-18: qty 1

## 2013-12-18 MED ORDER — ACETAMINOPHEN 325 MG PO TABS
650.0000 mg | ORAL_TABLET | Freq: Four times a day (QID) | ORAL | Status: DC | PRN
  Administered 2013-12-18: 650 mg via ORAL
  Filled 2013-12-18: qty 2

## 2013-12-18 MED ORDER — HEPARIN SODIUM (PORCINE) 5000 UNIT/ML IJ SOLN
5000.0000 [IU] | Freq: Three times a day (TID) | INTRAMUSCULAR | Status: DC
Start: 2013-12-18 — End: 2013-12-20
  Administered 2013-12-18 – 2013-12-20 (×5): 5000 [IU] via SUBCUTANEOUS
  Filled 2013-12-18 (×8): qty 1

## 2013-12-18 MED ORDER — SODIUM CHLORIDE 0.9 % IV BOLUS (SEPSIS)
1000.0000 mL | Freq: Once | INTRAVENOUS | Status: AC
  Administered 2013-12-18: 1000 mL via INTRAVENOUS

## 2013-12-18 MED ORDER — INFLUENZA VAC SPLIT QUAD 0.5 ML IM SUSY
0.5000 mL | PREFILLED_SYRINGE | INTRAMUSCULAR | Status: DC
  Filled 2013-12-18: qty 0.5

## 2013-12-18 NOTE — ED Notes (Signed)
Pt had a full body jerk back when checking CBG.  Pt moaned but did not speak.

## 2013-12-18 NOTE — ED Notes (Signed)
Dr Criss AlvineGoldston at bedside speaking with family and assessing pt.

## 2013-12-18 NOTE — Progress Notes (Signed)
Went into patients room. Patient oriented to self and place. Pt states "I think it's fall or winter." Complaining of headache at this time. Family states that patient has been talking since 1800. Notified MD Ngo of this change. MD stated that he will place order for medication for headache. Will continue to monitor.

## 2013-12-18 NOTE — ED Notes (Signed)
Myself and Lillia AbedLindsay, RN undressed pt, placed in gown, on monitor, continuous pulse oximetry and blood pressure cuff; family at bedside

## 2013-12-18 NOTE — ED Provider Notes (Signed)
CSN: [id number]     Arrival date & time 12/18/13  1029 History   First MD Initiated Contact with Patient 12/18/13 1049     Chief Complaint  Patient presents with  . Chest Pain     (Consider location/radiation/quality/duration/timing/severity/associated sxs/prior Treatment) HPI 28 year old female brought in by friends due to altered mental status. Patient is unable to provide a history at this time. They report that she's been having lower chest possibly upper abdominal pain for the past one week. She's been having intermittent nausea and vomiting when she eats. She was vomiting today. They note that they were taxing her earlier this morning and she was responding although they did not actually talk to her. They had to knock on the door and go in for him because she's not answering. They picked her up because she was unresponsive and brought her in to the ER. They do not know she took any new medicines but did not think she needed. She's not normally on any medicines. No known drug use per them.  History reviewed. No pertinent past medical history. History reviewed. No pertinent past surgical history. History reviewed. No pertinent family history. History  Substance Use Topics  . Smoking status: Never Smoker   . Smokeless tobacco: Not on file  . Alcohol Use: No   OB History   Grav Para Term Preterm Abortions TAB SAB Ect Mult Living                 Review of Systems  Unable to perform ROS: Mental status change      Allergies  Review of patient's allergies indicates no known allergies.  Home Medications   Prior to Admission medications   Medication Sig Start Date End Date Taking? Authorizing Provider  acetaminophen (TYLENOL) 500 MG tablet Take 1,000 mg by mouth every 6 (six) hours as needed. For pain    Historical Provider, MD   BP 110/64  Pulse 57  Temp(Src) 97.4 F (36.3 C) (Rectal)  Resp 19  SpO2 100%  LMP 12/09/2013 Physical Exam  Nursing note and vitals  reviewed. Constitutional: She is oriented to person, place, and time. She appears well-developed and well-nourished. No distress.  HENT:  Head: Normocephalic and atraumatic.  Right Ear: External ear normal.  Left Ear: External ear normal.  Nose: Nose normal.  Eyes: Pupils are equal, round, and reactive to light. Right eye exhibits no discharge. Left eye exhibits no discharge.  Neck: Neck supple.  No stiffness, full passive ROM  Cardiovascular: Normal rate, regular rhythm and normal heart sounds.   Pulmonary/Chest: Effort normal and breath sounds normal.  Abdominal: Soft. There is no tenderness.  Neurological: She is alert and oriented to person, place, and time. She displays no Babinski's sign on the right side. She displays no Babinski's sign on the left side.  Reflex Scores:      Bicep reflexes are 2+ on the right side and 2+ on the left side.      Patellar reflexes are 2+ on the right side and 2+ on the left side. Patient lethargic, does not respond to commands. Makes some movements to pain. When CBG drawn, arm flinched back. When trying to open her eyes her eyes roll to back of her head. Eyes appear to be normal and pupils are dilated but reactive. Eyes appear to be midline and normal until her eyes are forced open.  Skin: Skin is warm and dry.    ED Course  Procedures (including critical care time) Labs  Review Labs Reviewed  BASIC METABOLIC PANEL - Abnormal; Notable for the following:    Sodium 136 (*)    Glucose, Bld 110 (*)    All other components within normal limits  LIPASE, BLOOD - Abnormal; Notable for the following:    Lipase 114 (*)    All other components within normal limits  SALICYLATE LEVEL - Abnormal; Notable for the following:    Salicylate Lvl <1.7 (*)    All other components within normal limits  URINALYSIS, ROUTINE W REFLEX MICROSCOPIC - Abnormal; Notable for the following:    Color, Urine AMBER (*)    APPearance CLOUDY (*)    Ketones, ur 15 (*)     Leukocytes, UA TRACE (*)    All other components within normal limits  URINE MICROSCOPIC-ADD ON - Abnormal; Notable for the following:    Squamous Epithelial / LPF FEW (*)    Bacteria, UA MANY (*)    All other components within normal limits  CBG MONITORING, ED - Abnormal; Notable for the following:    Glucose-Capillary 102 (*)    All other components within normal limits  I-STAT ARTERIAL BLOOD GAS, ED - Abnormal; Notable for the following:    Bicarbonate 25.9 (*)    All other components within normal limits  CBC  PRO B NATRIURETIC PEPTIDE  HEPATIC FUNCTION PANEL  URINE RAPID DRUG SCREEN (HOSP PERFORMED)  ACETAMINOPHEN LEVEL  ETHANOL  I-STAT TROPOININ, ED  POC URINE PREG, ED    Imaging Review Ct Head Wo Contrast  12/18/2013   CLINICAL DATA:  Lethargy, altered mental status, nonverbal  EXAM: CT HEAD WITHOUT CONTRAST  TECHNIQUE: Contiguous axial images were obtained from the base of the skull through the vertex without intravenous contrast.  COMPARISON:  06/30/2011  FINDINGS: There is no evidence of acute intracranial hemorrhage, brain edema, mass lesion, acute infarction, mass effect, or midline shift. Acute infarct may be inapparent on noncontrast CT. No other intra-axial abnormalities are seen, and the ventricles and sulci are within normal limits in size and symmetry. No abnormal extra-axial fluid collections or masses are identified. No significant calvarial abnormality.  IMPRESSION: 1. Negative for bleed or other acute intracranial process.   Electronically Signed   By: Arne Cleveland M.D.   On: 12/18/2013 12:25   Dg Chest Port 1 View  12/18/2013   CLINICAL DATA:  Two days of chest pain; postprandial vomiting; unresponsive since this morning. ; no history of tobacco use; initial visit  EXAM: PORTABLE CHEST - 1 VIEW  COMPARISON:  PA and lateral chest x-ray of June 12, 2010  FINDINGS: The lungs are well-expanded and clear. The heart and mediastinal structures are normal. The pulmonary  vascularity is not engorged. There is no pleural effusion or pneumothorax. The bony thorax is unremarkable.  IMPRESSION: There is no acute cardiopulmonary abnormality.   Electronically Signed   By: David  Martinique   On: 12/18/2013 11:37     EKG Interpretation   Date/Time:  Saturday December 18 2013 10:32:52 EDT Ventricular Rate:  64 PR Interval:  148 QRS Duration: 102 QT Interval:  406 QTC Calculation: 418 R Axis:   86 Text Interpretation:  Normal sinus rhythm Normal ECG No old tracing to  compare Confirmed by Joliet Mallozzi  MD, Kerstyn Coryell ([id number]) on 12/18/2013 11:09:48 AM      MDM   Final diagnoses:  Decreased responsiveness    There is no obvious reason for why the patient is less responsive. The patient's exam is inconsistent. I discussed her case  with neurology, Dr. Leonel Ramsay, who evaluated the patient has ordered a stat EEG as atypical status epilepticus could cause similar symptoms. The EEG is negative. The patient is still not responding and is clearly unable to walk. This is most likely a psychiatric issue but she is unable to go home. Neuro requesting ESR and CRP, as if elevated, may consider LP. On my exam I have low suspicion this is meningitis given fully mobile neck and exam more c/w non-neurologic pathology.  Ephraim Hamburger, MD 12/18/13 [phone]

## 2013-12-18 NOTE — Progress Notes (Signed)
Stat EEG completed. Dr. Amada JupiterKirkpatrick notified.

## 2013-12-18 NOTE — H&P (Signed)
Date: 12/18/2013               Patient Name:  Carol Rangel MRN: [id number]  DOB: 12-02-1985 Age / Sex: 28 y.o., female   PCP: No Pcp Per Patient         Medical Service: Internal Medicine Teaching Service         Attending Physician: Dr. Cliffton AstersJohn Campbell, MD    First Contact: Dr. Harold BarbanLawrence Lyndsey Demos Pager: [phone]  Second Contact: Dr. Evelena PeatAlex Wilson Pager: [phone]       After Hours (After 5p/  First Contact Pager: [phone]  weekends / holidays): Second Contact Pager: [phone]   Chief Complaint: decreased responsiveness  History of Present Illness:   Carol Rangel is a 28 year old [place] woman with no significant past medical history who was brought in by family and friends after being less responsive starting this morning.  Her sister and brother-in-law report that before this morning, patient had been complaining of chest pain, abdominal pain, and vomiting over the last week. She has also had decreased by mouth intake. However, mentally, she was in her usual state until this morning. Initially, she was texting coherently with her friends and family, but eventually, her brother-in-law came to her apartment and found that she was not opening the door. He initially found her laying in bed been much less interactive and responsive than she is usually.  Family reports that she does not have any significant past medical history, bleeding no history of seizures. They have never witnessed her pass out or have any shaking convulsions. Family denies any urine or soiled clothing when they found her this morning. Family denies any previous episodes that are similar to this one. Family also denies any sources of natural gas for heating purposes in the home. They also deny any significant medications around the house. [place] came [place] in [place] from [place]. She had been working at a [employer], quitting her job 7 months ago. Her family denies any significant stressors in her life.  Family does not know of any alcohol, tobacco, drug, or sexual activity. Family denies any recent travel outside the country.  Meds: Current Facility-Administered Medications  Medication Dose Route Frequency Provider Last Rate Last Dose  . heparin injection 5,000 Units  5,000 Units Subcutaneous 3 times per day Yolanda MangesAlex M Wilson, DO        Allergies: Allergies as of 12/18/2013  . (No Known Allergies)   History reviewed. No pertinent past medical history. History reviewed. No pertinent past surgical history. History reviewed. No pertinent family history. History   Social History  . Marital Status: Single    Spouse Name: N/A    Number of Children: N/A  . Years of Education: N/A   Occupational History  . Not on file.   Social History Main Topics  . Smoking status: Never Smoker   . Smokeless tobacco: Not on file  . Alcohol Use: No  . Drug Use:   . Sexual Activity:    Other Topics Concern  . Not on file   Social History Narrative  . No narrative on file    Review of Systems: History obtained from family. Patient unable to provide review of systems. Pertinent items are noted in HPI.  Physical Exam: Blood pressure 108/69, pulse 52, temperature 98.6 F (37 C), temperature source Oral, resp. rate 14, last menstrual period 12/09/2013, SpO2 100.00%. General: resting in bed, responsive to commands, eyes closed HEENT: exam limited, patient  consistently rolled her eyes back as we open her eyelids Cardiac: RRR, no rubs, murmurs or gallops Pulm: clear to auscultation bilaterally, moving normal volumes of air Abd: soft, nontender, nondistended, BS present Ext: warm and well perfused, no pedal edema Neuro: patient not responding to commands, patient slowly responds to painful stimuli but with no significant withdrawal to pressure on nailbeds, 2+ deep tendon reflexes in upper and lower extremities, negative Babinski Skin: no rashes or lesions noted Psych: not responsive to commands, but  intermittently moaning  Lab results: Basic Metabolic Panel:  Recent Labs  21/30/86 1045  NA 136*  K 3.7  CL 100  CO2 23  GLUCOSE 110*  BUN 10  CREATININE 0.61  CALCIUM 9.8   Liver Function Tests:  Recent Labs  12/18/13 1117  AST 12  ALT 6  ALKPHOS 57  BILITOT 0.5  PROT 8.0  ALBUMIN 4.2    Recent Labs  12/18/13 1117  LIPASE 114*   No results found for this basename: AMMONIA,  in the last 72 hours CBC:  Recent Labs  12/18/13 1045  WBC 10.1  HGB 14.2  HCT 41.4  MCV 87.7  PLT 214   Cardiac Enzymes: No results found for this basename: CKTOTAL, CKMB, CKMBINDEX, TROPONINI,  in the last 72 hours BNP:  Recent Labs  12/18/13 1045  PROBNP 11.7   D-Dimer: No results found for this basename: DDIMER,  in the last 72 hours CBG:  Recent Labs  12/18/13 1056  GLUCAP 102*   Hemoglobin A1C: No results found for this basename: HGBA1C,  in the last 72 hours Fasting Lipid Panel: No results found for this basename: CHOL, HDL, LDLCALC, TRIG, CHOLHDL, LDLDIRECT,  in the last 72 hours Thyroid Function Tests:  Recent Labs  12/18/13 1614  TSH 0.787   Anemia Panel: No results found for this basename: VITAMINB12, FOLATE, FERRITIN, TIBC, IRON, RETICCTPCT,  in the last 72 hours Coagulation: No results found for this basename: LABPROT, INR,  in the last 72 hours Urine Drug Screen: Drugs of Abuse     Component Value Date/Time   LABOPIA NONE DETECTED 12/18/2013 1115   COCAINSCRNUR NONE DETECTED 12/18/2013 1115   LABBENZ NONE DETECTED 12/18/2013 1115   AMPHETMU NONE DETECTED 12/18/2013 1115   THCU NONE DETECTED 12/18/2013 1115   LABBARB NONE DETECTED 12/18/2013 1115    Alcohol Level:  Recent Labs  12/18/13 1117  ETH <11   Urinalysis:  Recent Labs  12/18/13 1118  COLORURINE AMBER*  LABSPEC 1.017  PHURINE 5.5  GLUCOSEU NEGATIVE  HGBUR NEGATIVE  BILIRUBINUR NEGATIVE  KETONESUR 15*  PROTEINUR NEGATIVE  UROBILINOGEN 0.2  NITRITE NEGATIVE  LEUKOCYTESUR  TRACE*   Imaging results:  Ct Head Wo Contrast  12/18/2013   CLINICAL DATA:  Lethargy, altered mental status, nonverbal  EXAM: CT HEAD WITHOUT CONTRAST  TECHNIQUE: Contiguous axial images were obtained from the base of the skull through the vertex without intravenous contrast.  COMPARISON:  06/30/2011  FINDINGS: There is no evidence of acute intracranial hemorrhage, brain edema, mass lesion, acute infarction, mass effect, or midline shift. Acute infarct may be inapparent on noncontrast CT. No other intra-axial abnormalities are seen, and the ventricles and sulci are within normal limits in size and symmetry. No abnormal extra-axial fluid collections or masses are identified. No significant calvarial abnormality.  IMPRESSION: 1. Negative for bleed or other acute intracranial process.   Electronically Signed   By: Oley Balm M.D.   On: 12/18/2013 12:25   Dg Chest Port 1  View  12/18/2013   CLINICAL DATA:  Two days of chest pain; postprandial vomiting; unresponsive since this morning. ; no history of tobacco use; initial visit  EXAM: PORTABLE CHEST - 1 VIEW  COMPARISON:  PA and lateral chest x-ray of June 12, 2010  FINDINGS: The lungs are well-expanded and clear. The heart and mediastinal structures are normal. The pulmonary vascularity is not engorged. There is no pleural effusion or pneumothorax. The bony thorax is unremarkable.  IMPRESSION: There is no acute cardiopulmonary abnormality.   Electronically Signed   By: David  [place]   On: 12/18/2013 11:37    Other results: EKG: normal EKG, normal sinus rhythm.  Assessment & Plan by Problem: Active Problems:   Decreased responsiveness   Acute encephalopathy  Ms. Rangel is a 45 -year-old woman with no significant past medical history who presents with decreased responsiveness since this morning, unclear etiology.  # Decreased responsiveness: Electrolytes within normal limits. No hypoglycemia. Hepatic panel within normal limits. No elevated  levels of acetaminophen, alcohol, salicylates. CBC and ABG within normal limits. Chest x-ray and CT head without any acute abnormalities. Urine drug screen negative. Lipase only mildly elevated at 114.  EEG unremarkable for ongoing seizure activity or regular slow activity. Remaining differential includes pseudoseizures, hypothyroidism, and encephalitits. - Neurology following, not favoring seizure activity. - TSH - Consider psychiatry consultation if no organic causes found.  # ?Abdominal pain/chest pain/nausea/vomiting: Patient not able to report about this history herself. No evidence of malnutrition or electrolyte abnormality secondary to nausea and vomiting. No signs of cardiac disease and 28 year old patient with no factors for such disease.  - Will continue to monitor. No symptoms since arrival in ED.    Dispo: Disposition is deferred at this time, awaiting improvement of current medical problems. Anticipated discharge in approximately 3 day(s).   The patient does not have a current PCP (No Pcp Per Patient) and does need an [facility] hospital follow-up appointment after discharge.  The patient does not have transportation limitations that hinder transportation to clinic appointments.  Signed: Harold Barban, MD 12/18/2013, 5:21 PM

## 2013-12-18 NOTE — Procedures (Signed)
History: 28 yo F with decreased responsiveness.   Sedation: None  Technique: This is a 17 channel routine scalp EEG performed at the bedside with bipolar and monopolar montages arranged in accordance to the international 10/20 system of electrode placement. One channel was dedicated to EKG recording.    Background: There is a well defined posterior dominant rhythm of 10 Hz that attenuates with eye opening. The background consists of intermixed alpha and beta activities. There is increased of activity associated with drowsiness There is some normal sleep recorded.  Photic stimulation: Physiologic driving is present  EEG Abnormalities: None  Clinical Interpretation: This normal EEG is recorded in the waking and sleep state. There was no seizure or seizure predisposition recorded on this study.   The patient's decreased level of responsiveness is not reflected either by ongoing seizure activity or regular slow activity. This was a normal study.  Ritta SlotMcNeill Mychael Smock, MD [facility] [phone]  If 7pm- 7am, please page neurology on call as listed in [facility].

## 2013-12-18 NOTE — Progress Notes (Signed)
[facility] into 5w16

## 2013-12-18 NOTE — Consult Note (Signed)
Neurology Consultation Reason for Consult: Altered Mental Status Referring Physician: Regenia Skeeter, S  CC: unresponsive  History is obtained from:family  HPI: Carol Rangel is a 28 y.o. female with a history of chest pain for the past week who was in her normal mental state until this morning. Family was unable to communicate with her and therefore went to check on her and found her unresponsive. She was therefore brought to the emergency room. Since arriving here, she continues to be minimally responsive.  Speaking with her family, she states that she has been complaining of chest pain, abdominal pain, nausea for the past week. She has not been eating or drinking well.    ROS:  Unable to obtain due to altered mental status.   History reviewed. No pertinent past medical history.  Family History: Unable to obtain due to altered mental status.  Social History: Family denies any significant alcohol or drug use  Exam: Current vital signs: BP 108/66  Pulse 63  Temp(Src) 97.4 F (36.3 C) (Rectal)  Resp 16  SpO2 98%  LMP 12/09/2013 Vital signs in last 24 hours: Temp:  [97.4 F (36.3 C)] 97.4 F (36.3 C) (10/03 1058) Pulse Rate:  [50-63] 63 (10/03 1245) Resp:  [11-24] 16 (10/03 1245) BP: (100-118)/(62-77) 108/66 mmHg (10/03 1245) SpO2:  [98 %-100 %] 98 % (10/03 1245)  General: In bed, keeps eyes closed occasionally fluttering CV: Regular in rhythm Mental Status: Patient moans to noxious stimuli, but does not interact or verbalize or follow commands. She has voluntary eye closure with Bell's phenomena when the eyes are opened Cranial Nerves: II: Unable to assess fields with blink to threat due to patient keeping eyes tightly closed and resisting opening. Pupils are equal, round, and reactive to light.  Discs are difficult to visualize. III,IV, VI: She moves eyes across midline both directions to avoid light stimulus V: Facial sensation is symmetric to temperature VII: Facial  movement is symmetric.  VIII: hearing is intact to voice X: Uvula elevates symmetrically XI: Shoulder shrug is symmetric. XII: tongue is midline without atrophy or fasciculations.  Motor: Tone is normal. Bulk is normal. She does not move any of her extremities to command and minimally flinches to noxious stimuli. She does, however intermittently have movements of all 4 extremities. Her exam is inconsistent Sensory: Grimaces to noxious stimuli in all 4 extremities Deep Tendon Reflexes: 2+ and symmetric in the biceps and patellae.  Cerebellar: Unable to obtain due to altered mental status. Gait: Unable to obtain due to altered mental status.        I have reviewed labs in epic and the results pertinent to this consultation are: CMP normal  I have reviewed the images obtained: CT head-unremarkable  Impression: 28 year old female with decreased responsiveness. To rule out acute seizures stat EEG was obtained and the patient was still unresponsive during the EEG with completely normal findings including a normal background rhythm. 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   She has a normal ESR, and I have a very low suspicion for infectious etiology.  Recommendations: 1) agree with TSH 2) if this is normal, would consider psychiatric evaluation. Her   Roland Rack, MD [facility] [phone]  If 7pm- 7am, please page neurology on call as listed in [place].

## 2013-12-18 NOTE — Progress Notes (Signed)
NURSING PROGRESS NOTE  Carol Rangel [id number] Admission Data: 12/18/2013 7:52 PM Attending Provider: Cliffton AstersJohn Campbell, MD PCP:No PCP Per Patient Code Status: FULL  Carol Rangel is a 28 y.o. female patient admitted from ED:  -No acute distress noted.  -No complaints of shortness of breath.  -No complaints of chest pain.   Cardiac Monitoring: Box #11in place. Cardiac monitor yields:sinus bradycardia.  Blood pressure 108/69, pulse 52, temperature 98.6 F (37 C), temperature source Oral, resp. rate 14, height 5\' 5"  (1.651 m), weight 48.1 kg (106 lb 0.7 oz), last menstrual period 12/09/2013, SpO2 100.00%.   IV Fluids:  IV in place, occlusive dsg intact without redness, IV cath antecubital left, condition patent and no redness normal saline.   Allergies:  Review of patient's allergies indicates no known allergies.  Past Medical History:   has no past medical history on file.  Past Surgical History:   has no past surgical history on file.  Skin: intact  Patient unresponsive at this time. Family orientated to room. Information packet given to patient/family. Admission inpatient armband information verified with patient/family to include name and date of birth and placed on patient arm. Side rails up x 2, fall assessment and education completed with family. Family able to verbalize understanding of risk associated with falls and verbalized understanding to call for assistance before getting out of bed. Call light within reach. Family able to voice and demonstrate understanding of unit orientation instructions.    Will continue to evaluate and treat per MD orders.  [name], RN

## 2013-12-18 NOTE — ED Notes (Signed)
Pt moaned and opened her eyes when checking her rectal temperature.  Pt would not answer any questions asked by Dr Criss AlvineGoldston.

## 2013-12-18 NOTE — ED Notes (Signed)
Pt family reports that she has been having chest pain for the past 2 days and this morning was not feeling well. Reports that she vomited once, but she is lethargic at triage and hard to arouse. No medical hx per family.

## 2013-12-18 NOTE — ED Notes (Signed)
Pt resting.

## 2013-12-18 NOTE — ED Notes (Addendum)
Pt resisted mildly with her leg during the I&O cath procedure.  Pt opened her eyes when inserting the catheter.

## 2013-12-18 NOTE — ED Notes (Signed)
In and out cathed pt with assistance from [place], Charity fundraiserN; family waited outside pt's room during procedure

## 2013-12-18 NOTE — ED Notes (Signed)
Pt moved from Trauma A to Pod B17; pt placed on monitor, continuous pulse oximetry and bloos pressure cuff; family at bedside

## 2013-12-18 NOTE — H&P (Signed)
         Attending progress note    Date of Admission:  12/18/2013     Active Problems:   Decreased responsiveness   Acute encephalopathy   I have seen and examined Carol Rangel and discussed her case with Dr. Lequita HaltMorgan and our team. [name] was reportedly in good health and on no medications until about a week ago when she began have some abdominal pain with intermittent nausea and vomiting. When her family could not get in touch with her by phone this morning and went to find her she was unresponsive and brought to the emergency department. No evidence of drug or alcohol use has been found. No metabolic disturbance has been found and there is no obvious acute central nervous system abnormality to explain her unresponsiveness. It seems as though she may be somewhat improved over the course of the day. One of our phlebotomists noted that she did not flinch at all when blood was drawn this morning. She did flinch appropriately when blood was drawn just now and she is moaning and moving her head spontaneously. She has a normal EEG. When I dropped her arm by her side onto the mattress it flops right down. When I hold her arm above her face and let go she has good muscle tone and prevents her arm from falling on her face. We will continue observation here in the hospital. We are awaiting formal neurology consultation.  Cliffton AstersJohn Clayborn Milnes, MD [facility] [phone] pager   [phone] 12/18/2013, 4:26 PM

## 2013-12-19 DIAGNOSIS — R112 Nausea with vomiting, unspecified: Secondary | ICD-10-CM

## 2013-12-19 DIAGNOSIS — R410 Disorientation, unspecified: Secondary | ICD-10-CM

## 2013-12-19 DIAGNOSIS — R1013 Epigastric pain: Secondary | ICD-10-CM

## 2013-12-19 DIAGNOSIS — N920 Excessive and frequent menstruation with regular cycle: Secondary | ICD-10-CM

## 2013-12-19 LAB — COMPREHENSIVE METABOLIC PANEL WITH GFR
ALT: 6 U/L (ref 0–35)
AST: 10 U/L (ref 0–37)
Albumin: 3.5 g/dL (ref 3.5–5.2)
Alkaline Phosphatase: 51 U/L (ref 39–117)
Anion gap: 9 (ref 5–15)
BUN: 12 mg/dL (ref 6–23)
CO2: 26 meq/L (ref 19–32)
Calcium: 9.1 mg/dL (ref 8.4–10.5)
Chloride: 104 meq/L (ref 96–112)
Creatinine, Ser: 0.6 mg/dL (ref 0.50–1.10)
GFR calc Af Amer: >90 mL/min
GFR calc non Af Amer: >90 mL/min
Glucose, Bld: 88 mg/dL (ref 70–99)
Potassium: 3.5 meq/L — ABNORMAL LOW (ref 3.7–5.3)
Sodium: 139 meq/L (ref 137–147)
Total Bilirubin: 0.5 mg/dL (ref 0.3–1.2)
Total Protein: 6.9 g/dL (ref 6.0–8.3)

## 2013-12-19 LAB — PREGNANCY, URINE: PREG TEST UR: NEGATIVE

## 2013-12-19 LAB — CBC
HCT: 35.4 % — ABNORMAL LOW (ref 36.0–46.0)
Hemoglobin: 12 g/dL (ref 12.0–15.0)
MCH: 30.2 pg (ref 26.0–34.0)
MCHC: 33.9 g/dL (ref 30.0–36.0)
MCV: 88.9 fL (ref 78.0–100.0)
Platelets: 203 10*3/uL (ref 150–400)
RBC: 3.98 MIL/uL (ref 3.87–5.11)
RDW: 12.2 % (ref 11.5–15.5)
WBC: 7.2 10*3/uL (ref 4.0–10.5)

## 2013-12-19 LAB — WET PREP, GENITAL: Trich, Wet Prep: NONE SEEN

## 2013-12-19 LAB — T4, FREE: Free T4: 1.49 ng/dL (ref 0.80–1.80)

## 2013-12-19 MED ORDER — ONDANSETRON HCL 4 MG/2ML IJ SOLN
4.0000 mg | Freq: Four times a day (QID) | INTRAMUSCULAR | Status: DC | PRN
Start: 2013-12-19 — End: 2013-12-19
  Administered 2013-12-19: 4 mg via INTRAVENOUS
  Filled 2013-12-19: qty 2

## 2013-12-19 MED ORDER — PROMETHAZINE HCL 25 MG PO TABS
12.5000 mg | ORAL_TABLET | Freq: Four times a day (QID) | ORAL | Status: DC | PRN
  Administered 2013-12-20: 12.5 mg via ORAL
  Filled 2013-12-19: qty 1

## 2013-12-19 MED ORDER — FAMOTIDINE 20 MG PO TABS
20.0000 mg | ORAL_TABLET | Freq: Two times a day (BID) | ORAL | Status: DC
  Administered 2013-12-19 – 2013-12-20 (×3): 20 mg via ORAL
  Filled 2013-12-19 (×4): qty 1

## 2013-12-19 MED ORDER — METRONIDAZOLE 0.75 % VA GEL
1.0000 | Freq: Every day | VAGINAL | Status: DC
  Administered 2013-12-19: 1 via VAGINAL
  Filled 2013-12-19: qty 70

## 2013-12-19 NOTE — Progress Notes (Signed)
Subjective:  Patient is now interactive and speaking. Patient reports that she has not been eating well over the last week, and has been feeling week overall. She states that she hasn't been able to be because she has epigastric pain and becomes nauseated every time she tries eat. She hasn't been able to eat any of the food in the hospital since she has been here. Patient doesn't know what exactly brought her into the hospital. Patient denies any significant stressors in her life. Patient denies any feelings of sad mood or any feelings of anxiety overall. Patient also mentions that her menstrual periods have been lasting for 10 days at a time and have been foul-smelling, last period started on September 20.  Objective: Vital signs in last 24 hours: Filed Vitals:   12/18/13 1645 12/18/13 1719 12/18/13 2206 12/19/13 0516  BP: 100/58 108/69 94/53 89/49   Pulse: 57 52 52 50  Temp:  98.6 F (37 C) 98.4 F (36.9 C) 98.2 F (36.8 C)  TempSrc:  Oral Oral Oral  Resp: 16 14 15 15   Height:  5\' 5"  (1.651 m)    Weight:  106 lb 0.7 oz (48.1 kg)    SpO2: 98% 100% 100% 100%   Weight change:   Intake/Output Summary (Last 24 hours) at 12/19/13 1155 Last data filed at 12/19/13 0917  Gross per 24 hour  Intake    360 ml  Output      0 ml  Net    360 ml   General: resting in bed, mildly somnolent HEENT: PERRL, EOMI, no scleral icterus Cardiac: RRR, no rubs, murmurs or gallops Pulm: clear to auscultation bilaterally, moving normal volumes of air Abd: soft, nontender, nondistended, BS present Ext: warm and well perfused, no pedal edema Neuro: alert and oriented X3, cranial nerves II-XII grossly intact Psych: flat affect, mildly somnolent, answering questions appropriately  Lab Results: Basic Metabolic Panel:  Recent Labs Lab 12/18/13 1045 12/19/13 0400  NA 136* 139  K 3.7 3.5*  CL 100 104  CO2 23 26  GLUCOSE 110* 88  BUN 10 12  CREATININE 0.61 0.60  CALCIUM 9.8 9.1   Liver Function  Tests:  Recent Labs Lab 12/18/13 1117 12/19/13 0400  AST 12 10  ALT 6 6  ALKPHOS 57 51  BILITOT 0.5 0.5  PROT 8.0 6.9  ALBUMIN 4.2 3.5    Recent Labs Lab 12/18/13 1117  LIPASE 114*   No results found for this basename: AMMONIA,  in the last 168 hours CBC:  Recent Labs Lab 12/18/13 1045 12/19/13 0400  WBC 10.1 7.2  HGB 14.2 12.0  HCT 41.4 35.4*  MCV 87.7 88.9  PLT 214 203   Cardiac Enzymes: No results found for this basename: CKTOTAL, CKMB, CKMBINDEX, TROPONINI,  in the last 168 hours BNP:  Recent Labs Lab 12/18/13 1045  PROBNP 11.7   D-Dimer: No results found for this basename: DDIMER,  in the last 168 hours CBG:  Recent Labs Lab 12/18/13 1056  GLUCAP 102*   Hemoglobin A1C: No results found for this basename: HGBA1C,  in the last 168 hours Fasting Lipid Panel: No results found for this basename: CHOL, HDL, LDLCALC, TRIG, CHOLHDL, LDLDIRECT,  in the last 168 hours Thyroid Function Tests:  Recent Labs Lab 12/18/13 1614  TSH 0.787   Coagulation: No results found for this basename: LABPROT, INR,  in the last 168 hours Anemia Panel: No results found for this basename: VITAMINB12, FOLATE, FERRITIN, TIBC, IRON, RETICCTPCT,  in the  last 168 hours Urine Drug Screen: Drugs of Abuse     Component Value Date/Time   LABOPIA NONE DETECTED 12/18/2013 1115   COCAINSCRNUR NONE DETECTED 12/18/2013 1115   LABBENZ NONE DETECTED 12/18/2013 1115   AMPHETMU NONE DETECTED 12/18/2013 1115   THCU NONE DETECTED 12/18/2013 1115   LABBARB NONE DETECTED 12/18/2013 1115    Alcohol Level:  Recent Labs Lab 12/18/13 1117  ETH <11   Urinalysis:  Recent Labs Lab 12/18/13 1118  COLORURINE AMBER*  LABSPEC 1.017  PHURINE 5.5  GLUCOSEU NEGATIVE  HGBUR NEGATIVE  BILIRUBINUR NEGATIVE  KETONESUR 15*  PROTEINUR NEGATIVE  UROBILINOGEN 0.2  NITRITE NEGATIVE  LEUKOCYTESUR TRACE*   Micro Results: No results found for this or any previous visit (from the past 240  hour(s)). Studies/Results: Ct Head Wo Contrast  12/18/2013   CLINICAL DATA:  Lethargy, altered mental status, nonverbal  EXAM: CT HEAD WITHOUT CONTRAST  TECHNIQUE: Contiguous axial images were obtained from the base of the skull through the vertex without intravenous contrast.  COMPARISON:  06/30/2011  FINDINGS: There is no evidence of acute intracranial hemorrhage, brain edema, mass lesion, acute infarction, mass effect, or midline shift. Acute infarct may be inapparent on noncontrast CT. No other intra-axial abnormalities are seen, and the ventricles and sulci are within normal limits in size and symmetry. No abnormal extra-axial fluid collections or masses are identified. No significant calvarial abnormality.  IMPRESSION: 1. Negative for bleed or other acute intracranial process.   Electronically Signed   By: Oley Balmaniel  Hassell M.D.   On: 12/18/2013 12:25   Dg Chest Port 1 View  12/18/2013   CLINICAL DATA:  Two days of chest pain; postprandial vomiting; unresponsive since this morning. ; no history of tobacco use; initial visit  EXAM: PORTABLE CHEST - 1 VIEW  COMPARISON:  PA and lateral chest x-ray of June 12, 2010  FINDINGS: The lungs are well-expanded and clear. The heart and mediastinal structures are normal. The pulmonary vascularity is not engorged. There is no pleural effusion or pneumothorax. The bony thorax is unremarkable.  IMPRESSION: There is no acute cardiopulmonary abnormality.   Electronically Signed   By: David  Swaz[place]   On: 12/18/2013 11:37   Medications: I have reviewed the patient's current medications. Scheduled Meds: . heparin  5,000 Units Subcutaneous 3 times per day  . Influenza vac split quadrivalent PF  0.5 mL Intramuscular Tomorrow-1000   Continuous Infusions:  PRN Meds:.acetaminophen, ibuprofen, promethazine Assessment/Plan: Principal Problem:   Acute encephalopathy Active Problems:   Decreased responsiveness  [name] is a 28 -year-old woman with no significant  past medical history who presents with decreased responsiveness since the morning of 12/18/2013, now resolved, likely secondary to a psychogenic etiology.  # Decreased responsiveness secondary to psychogenic etiology: Workup for organic causes have been negative thus far. Electrolytes within normal limits. No hypoglycemia. Hepatic panel within normal limits. No elevated levels of acetaminophen, alcohol, salicylates. CBC and ABG within normal limits. Chest x-ray and CT head without any acute abnormalities. Urine drug screen negative. Lipase only mildly elevated at 114. EEG unremarkable for ongoing seizure activity or regular slow activity. The patient has abruptly returned to a more normal mental state. Patient was previously not responsive to questioning and minimally reactive to noxious stimuli, patient is now conversational and answering questions appropriately.  - Neurology favoring psychogenic cause, appreciate recommendations.  - Psychiatry consult  # Epigastric pain/nausea/vomiting: Contrary to prior reports from family, patient has not been having chest pain but rather epigastric pain that  is associated with some nausea that is preventing her from having adequate food intake. Patient is not taking any NSAIDs at home. No known family history of cancer. Likely secondary to dyspepsia. No evidence of anemia.  - Trial of ranitidine (PPIs not on $4 list)  # Prolonged menstrual periods: Patient reports having periods that last 10 days at a time with foul smell, last period was 12/05/2013, still ongoing currently. There is no evidence from labs of anemia.  - Plan for pelvic exam  - Likely follow up with OB/GYN as an outpatient.   Dispo: Disposition is deferred at this time, awaiting improvement of current medical problems.  Anticipated discharge in approximately 1 day(s).   The patient does not have a current PCP (No Pcp Per Patient) and does need an [facility] hospital follow-up appointment after  discharge.  The patient does not have transportation limitations that hinder transportation to clinic appointments.  .Services Needed at time of discharge: Y = Yes, Blank = No PT:   OT:   RN:   Equipment:   Other:     LOS: 1 day   Harold Barban, MD 12/19/2013, 11:55 AM

## 2013-12-19 NOTE — Progress Notes (Addendum)
Subjective: Much improved overnight.   She denies stress.   Exam: Filed Vitals:   12/19/13 0516  BP: 89/49  Pulse: 50  Temp: 98.2 F (36.8 C)  Resp: 15   Gen: In bed, NAD MS: Awake, alert, interactive and appropriate, follows commands.  ZO:XWRUECN:PERRL, EOMI Motor: 5/5 throughout with encouragement.  Sensory:intact to LT Cerebellar: normal FNF  EEG: normal   Impression: 28 yo F with AMS following a week of chest/abdominal pain with decreased appetite. She had a completely normal EEG during her period of decreased responsiveness. I do not have suspicion for structural lesion. I suspect this is likely psychogenic in etiology. One possible unifying diagnosis that I feel is unlikely would be adrenal insufficiency, but will defer workup of this if any to primary team.   Recommendations: 1) Could consider workup for adrenal insufficiency 2) No further testing at this time. Please call with any further questions or concerns.   Ritta SlotMcNeill Brenn Gatton, MD [facility] [phone]  If 7pm- 7am, please page neurology on call as listed in AMION.

## 2013-12-19 NOTE — Progress Notes (Signed)
Interim Progress Note:  Subjective:  Patient states that she has been experiencing painful periods for the past 5 years.  She says that the pain is localized to her lower abdomen and her lower back and is only present with her period.  She states that  During this her periods gradually increased from 4-5 days to 10 days with an increase in the amount of bleeding and at times her periods last longer.  She also has an associated foul smelling yellow discharge with her periods.  Patient has a significant other but says that she has not been sexually active for the past year because of these symptoms.  [facility].  Patient reports that "they did some tests but I never received any results".  She denies being the victim of physical, sexual or emotional abuse.   She says that she is still feeling weak.  She says that within the last week she has experienced early satiety with meals and also has so epigastric pain and when she tries to eat normally she becomes nauseated.  The nausea is only improved by eating or drinking very small amounts or completely abstaining from food.  Today she reports that meats and cream based food smells induce nausea.   Objective: General:  Patient awake, alert and cooperative with exam, no signs of distress, pleasant and cooperative  Abdomen:  Soft, minimally tender in epigastric area, no rebound/rigidity/guarding Vaginal exam - speculum and manual exams deferred; external genitalia grossly normal; no signs of trauma; no lesions or ulcer present; no discharge, blood or foul odor.  She noted some discomfort with insertion of swabs.  Psych:  Normal affect, appropriate mood for situation, normal behavior  A/P:  28 year old healthy woman admitted with acute encephalopathy of unclear etiology now awake and alert.  She now reports 1 week of epigastric pain, early satiety and nausea.  She also reports  chronic vaginal symptoms for the past five years.  Epigastric pain/nausea/vomiting x 1 week: Possibly dyspepsia vs ulcer vs gastroenteritis.  She tolerated clears at lunch.   - We have started famotidine.  Will advance diet as tolerated.  If she is tolerating diet and feeling better tomorrow she can likely d/c home with follow-up in our clinic.  Can consider GI referral for EGD if she does not respond to famotidine or PPI and is still symptomatic.   Prolonged menstrual periods: Patient reports having periods that last 10 days at a time with foul smell x 5 years.  Limited external vaginal exam unremarkable.  She denies concern for STD or pregnancy (last sexual encounter > 1 year ago).   - will send for upreg, HIV, BV/candida/trich and GC/chlamydia testing.   - will plan to have her follow up with OB/GYN as an outpatient.  Evelena PeatAlex Wilson, DO [facility], PGY2 [phone] 12/19/2013

## 2013-12-19 NOTE — Consult Note (Signed)
[name]   Reason for Consult:   Change in her mental status, decreased responsiveness, catatonia Referring Physician:  Dr Carol Rangel is an 28 y.o. female. Total Time spent with patient: 20 minutes  Assessment: AXIS I:  Delerium AXIS II:  Deferred AXIS III:  History reviewed. No pertinent past medical history. AXIS IV:  other psychosocial or environmental problems AXIS V:  61-70 mild symptoms  Plan:  No evidence of imminent risk to self or others at present.   Patient does not meet criteria for psychiatric inpatient admission. Supportive therapy provided about ongoing stressors. Discussed crisis plan, support from social network, calling 911, coming to the Emergency Department, and calling Suicide Hotline.  Subjective:   Carol Rangel is a 28 y.o. female patient admitted with decreased responsiveness, catatonia and change in her mental status.  HPI:  Patient seen chart reviewed.  Patient is 28 year old [place] woman who has no previous history of psychiatric illness was brought in by her family member because she was less responsive.  She appears to be week, lethargic and having abdominal pain and vomiting.  Consult was called because of delirium and catatonia.  Patient improved in last 24 hours.  She denies any previous history of hallucination, paranoia or any suicidal thinking.  She endorsed that for past one week she has been not eating and getting week and tired.  She mentioned that every time she tried to eat she had vomiting and abdominal pain.  The patient do not remember the circumstances that brought her to the hospital.  Apparently her family member came to her apartment and found that she was not opening the door.  She denies any symptoms of depression including crying spells, anhedonia, feelings of hopelessness or worthlessness.  She had a good support system which includes her cousin and her sister.  Her parents are in [place].  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  The patient has no children and currently she is living with her sister.  She denies any mania, psychosis or any hallucination.  Her family endorsed that she has episodes of weakness and anemia in the past and she gets fainting but decreased energy.  Patient has some language.  However she is able to comprehend and able to answer the question.  Patient denies any drinking or using any illegal substances.  Past Psychiatric History: History reviewed. No pertinent past medical history.  reports that she has never smoked. She does not have any smokeless tobacco history on file. She reports that she does not drink alcohol. Her drug history is not on file. History reviewed. No pertinent family history.   Living Arrangements: Other relatives (sister)   Abuse/Neglect [facility]) Physical Abuse: Denies Verbal Abuse: Denies Sexual Abuse: Denies Allergies:  No Known Allergies  ACT Assessment Complete:  No:   Past Psychiatric History: No past psychiatric history.  No past history of suicidal attempt, psychosis, mania or any hallucination.  She lives with her sister.  Objective: Blood pressure 89/49, pulse 50, temperature 98.2 F (36.8 C), temperature source Oral, resp. rate 15, height '5\' 5"'  (1.610 m), weight 106 lb 0.7 oz (48.1 kg), last menstrual period 12/09/2013, SpO2 100.00%.Body mass index is 17.65 kg/(m^2). Results for orders placed during the hospital encounter of 12/18/13 (from the past 72 hour(s))  CBC     Status: None  Collection Time    12/18/13 10:45 AM      Result Value Ref Range   WBC 10.1  4.0 - 10.5 K/uL   RBC 4.72  3.87 - 5.11 MIL/uL   Hemoglobin 14.2  12.0 - 15.0 g/dL   HCT 41.4  36.0 - 46.0 %   MCV 87.7  78.0 - 100.0 fL   MCH 30.1  26.0 - 34.0 pg   MCHC 34.3  30.0 - 36.0 g/dL   RDW 12.0  11.5 - 15.5 %   Platelets 214  150 - 400 K/uL  BASIC METABOLIC  PANEL     Status: Abnormal   Collection Time    12/18/13 10:45 AM      Result Value Ref Range   Sodium 136 (*) 137 - 147 mEq/L   Potassium 3.7  3.7 - 5.3 mEq/L   Chloride 100  96 - 112 mEq/L   CO2 23  19 - 32 mEq/L   Glucose, Bld 110 (*) 70 - 99 mg/dL   BUN 10  6 - 23 mg/dL   Creatinine, Ser 0.61  0.50 - 1.10 mg/dL   Calcium 9.8  8.4 - 10.5 mg/dL   GFR calc non Af Amer >90  >90 mL/min   GFR calc Af Amer >90  >90 mL/min   Comment: (NOTE)     The eGFR has been calculated using the CKD EPI equation.     This calculation has not been validated in all clinical situations.     eGFR's persistently <90 mL/min signify possible Chronic Kidney     Disease.   Anion gap 13  5 - 15  PRO B NATRIURETIC PEPTIDE     Status: None   Collection Time    12/18/13 10:45 AM      Result Value Ref Range   Pro B Natriuretic peptide (BNP) 11.7  0 - 125 pg/mL  I-STAT TROPOININ, ED     Status: None   Collection Time    12/18/13 10:53 AM      Result Value Ref Range   Troponin i, poc 0.00  0.00 - 0.08 ng/mL   Comment 3            Comment: Due to the release kinetics of cTnI,     a negative result within the first hours     of the onset of symptoms does not rule out     myocardial infarction with certainty.     If myocardial infarction is still suspected,     repeat the test at appropriate intervals.  CBG MONITORING, ED     Status: Abnormal   Collection Time    12/18/13 10:56 AM      Result Value Ref Range   Glucose-Capillary 102 (*) 70 - 99 mg/dL  URINE RAPID DRUG SCREEN (HOSP PERFORMED)     Status: None   Collection Time    12/18/13 11:15 AM      Result Value Ref Range   Opiates NONE DETECTED  NONE DETECTED   Cocaine NONE DETECTED  NONE DETECTED   Benzodiazepines NONE DETECTED  NONE DETECTED   Amphetamines NONE DETECTED  NONE DETECTED   Tetrahydrocannabinol NONE DETECTED  NONE DETECTED   Barbiturates NONE DETECTED  NONE DETECTED   Comment:            DRUG SCREEN FOR MEDICAL PURPOSES     ONLY.   IF CONFIRMATION IS NEEDED     FOR ANY PURPOSE, NOTIFY LAB  WITHIN 5 DAYS.                LOWEST DETECTABLE LIMITS     FOR URINE DRUG SCREEN     Drug Class       Cutoff (ng/mL)     Amphetamine      1000     Barbiturate      200     Benzodiazepine   836     Tricyclics       629     Opiates          300     Cocaine          300     THC              50  I-STAT ARTERIAL BLOOD GAS, ED     Status: Abnormal   Collection Time    12/18/13 11:15 AM      Result Value Ref Range   pH, Arterial 7.406  7.350 - 7.450   pCO2 arterial 41.0  35.0 - 45.0 mmHg   pO2, Arterial 89.0  80.0 - 100.0 mmHg   Bicarbonate 25.9 (*) 20.0 - 24.0 mEq/L   TCO2 27  0 - 100 mmol/L   O2 Saturation 97.0     Acid-Base Excess 1.0  0.0 - 2.0 mmol/L   Patient temperature 97.4 F     Collection site RADIAL, ALLEN'S TEST ACCEPTABLE     Drawn by VENIPUNCTURE     Sample type ARTERIAL    LIPASE, BLOOD     Status: Abnormal   Collection Time    12/18/13 11:17 AM      Result Value Ref Range   Lipase 114 (*) 11 - 59 U/L  HEPATIC FUNCTION PANEL     Status: None   Collection Time    12/18/13 11:17 AM      Result Value Ref Range   Total Protein 8.0  6.0 - 8.3 g/dL   Albumin 4.2  3.5 - 5.2 g/dL   AST 12  0 - 37 U/L   ALT 6  0 - 35 U/L   Alkaline Phosphatase 57  39 - 117 U/L   Total Bilirubin 0.5  0.3 - 1.2 mg/dL   Bilirubin, Direct <0.2  0.0 - 0.3 mg/dL   Indirect Bilirubin NOT CALCULATED  0.3 - 0.9 mg/dL  ACETAMINOPHEN LEVEL     Status: None   Collection Time    12/18/13 11:17 AM      Result Value Ref Range   Acetaminophen (Tylenol), Serum <15.0  10 - 30 ug/mL   Comment:            THERAPEUTIC CONCENTRATIONS VARY     SIGNIFICANTLY. A RANGE OF 10-30     ug/mL MAY BE AN EFFECTIVE     CONCENTRATION FOR MANY PATIENTS.     HOWEVER, SOME ARE BEST TREATED     AT CONCENTRATIONS OUTSIDE THIS     RANGE.     ACETAMINOPHEN CONCENTRATIONS     >150 ug/mL AT 4 HOURS AFTER     INGESTION AND >50 ug/mL AT 12     HOURS AFTER  INGESTION ARE     OFTEN ASSOCIATED WITH TOXIC     REACTIONS.  SALICYLATE LEVEL     Status: Abnormal   Collection Time    12/18/13 11:17 AM      Result Value Ref Range   Salicylate Lvl <4.7 (*) 2.8 - 20.0 mg/dL  ETHANOL  Status: None   Collection Time    12/18/13 11:17 AM      Result Value Ref Range   Alcohol, Ethyl (B) <11  0 - 11 mg/dL   Comment:            LOWEST DETECTABLE LIMIT FOR     SERUM ALCOHOL IS 11 mg/dL     FOR MEDICAL PURPOSES ONLY  URINALYSIS, ROUTINE W REFLEX MICROSCOPIC     Status: Abnormal   Collection Time    12/18/13 11:18 AM      Result Value Ref Range   Color, Urine AMBER (*) YELLOW   Comment: BIOCHEMICALS MAY BE AFFECTED BY COLOR   APPearance CLOUDY (*) CLEAR   Specific Gravity, Urine 1.017  1.005 - 1.030   pH 5.5  5.0 - 8.0   Glucose, UA NEGATIVE  NEGATIVE mg/dL   Hgb urine dipstick NEGATIVE  NEGATIVE   Bilirubin Urine NEGATIVE  NEGATIVE   Ketones, ur 15 (*) NEGATIVE mg/dL   Protein, ur NEGATIVE  NEGATIVE mg/dL   Urobilinogen, UA 0.2  0.0 - 1.0 mg/dL   Nitrite NEGATIVE  NEGATIVE   Leukocytes, UA TRACE (*) NEGATIVE  URINE MICROSCOPIC-ADD ON     Status: Abnormal   Collection Time    12/18/13 11:18 AM      Result Value Ref Range   Squamous Epithelial / LPF FEW (*) RARE   WBC, UA 0-2  <3 WBC/hpf   Bacteria, UA MANY (*) RARE   Urine-Other MUCOUS PRESENT    SEDIMENTATION RATE     Status: None   Collection Time    12/18/13  2:33 PM      Result Value Ref Range   Sed Rate 6  0 - 22 mm/hr  C-REACTIVE PROTEIN     Status: Abnormal   Collection Time    12/18/13  2:33 PM      Result Value Ref Range   CRP <0.5 (*) <0.60 mg/dL   Comment: Performed at [facility]  TSH     Status: None   Collection Time    12/18/13  4:14 PM      Result Value Ref Range   TSH 0.787  0.350 - 4.500 uIU/mL  COMPREHENSIVE METABOLIC PANEL     Status: Abnormal   Collection Time    12/19/13  4:00 AM      Result Value Ref Range   Sodium 139  137 - 147 mEq/L    Potassium 3.5 (*) 3.7 - 5.3 mEq/L   Chloride 104  96 - 112 mEq/L   CO2 26  19 - 32 mEq/L   Glucose, Bld 88  70 - 99 mg/dL   BUN 12  6 - 23 mg/dL   Creatinine, Ser 0.60  0.50 - 1.10 mg/dL   Calcium 9.1  8.4 - 10.5 mg/dL   Total Protein 6.9  6.0 - 8.3 g/dL   Albumin 3.5  3.5 - 5.2 g/dL   AST 10  0 - 37 U/L   ALT 6  0 - 35 U/L   Alkaline Phosphatase 51  39 - 117 U/L   Total Bilirubin 0.5  0.3 - 1.2 mg/dL   GFR calc non Af Amer >90  >90 mL/min   GFR calc Af Amer >90  >90 mL/min   Comment: (NOTE)     The eGFR has been calculated using the CKD EPI equation.     This calculation has not been validated in all clinical situations.     eGFR's  persistently <90 mL/min signify possible Chronic Kidney     Disease.   Anion gap 9  5 - 15  CBC     Status: Abnormal   Collection Time    12/19/13  4:00 AM      Result Value Ref Range   WBC 7.2  4.0 - 10.5 K/uL   RBC 3.98  3.87 - 5.11 MIL/uL   Hemoglobin 12.0  12.0 - 15.0 g/dL   HCT 35.4 (*) 36.0 - 46.0 %   MCV 88.9  78.0 - 100.0 fL   MCH 30.2  26.0 - 34.0 pg   MCHC 33.9  30.0 - 36.0 g/dL   RDW 12.2  11.5 - 15.5 %   Platelets 203  150 - 400 K/uL   Labs are reviewed..  Current Facility-Administered Medications  Medication Dose Route Frequency Provider Last Rate Last Dose  . acetaminophen (TYLENOL) tablet 650 mg  650 mg Oral Q6H PRN Luan Moore, MD   650 mg at 12/18/13 2016  . famotidine (PEPCID) tablet 20 mg  20 mg Oral BID Luan Moore, MD      . heparin injection 5,000 Units  5,000 Units Subcutaneous 3 times per day Francesca Oman, DO   5,000 Units at 12/19/13 4445  . ibuprofen (ADVIL,MOTRIN) tablet 400 mg  400 mg Oral Q6H PRN Charlott Rakes, MD      . Influenza vac split quadrivalent PF (FLUARIX) injection 0.5 mL  0.5 mL Intramuscular Tomorrow-1000 Michel Bickers, MD      . promethazine (PHENERGAN) tablet 12.5 mg  12.5 mg Oral Q6H PRN Luan Moore, MD        Psychiatric Specialty Exam:     Blood pressure 89/49, pulse 50, temperature 98.2  F (36.8 C), temperature source Oral, resp. rate 15, height '5\' 5"'  (1.651 m), weight 106 lb 0.7 oz (48.1 kg), last menstrual period 12/09/2013, SpO2 100.00%.Body mass index is 17.65 kg/(m^2).  General Appearance: Casual  Eye Contact::  Good  Speech:  Slow  Volume:  Decreased  Mood:  Anxious  Affect:  Appropriate  Thought Process:  Logical  Orientation:  Full (Time, Place, and Person)  Thought Content:  WDL  Suicidal Thoughts:  No  Homicidal Thoughts:  No  Memory:  Immediate;   Good  Judgement:  Intact  Insight:  Good  Psychomotor Activity:  Decreased  Concentration:  Fair  Recall:  [place] of Knowledge:Good  Language: Good  Akathisia:  No  Handed:  Right  AIMS (if indicated):     Assets:  Communication Skills Desire for Improvement Housing Social Support  Sleep:      Musculoskeletal: Strength & Muscle Tone: within normal limits Gait & Station: lying on bed, unable to acess Patient leans: N/A  Treatment Plan Summary: Patient is improving from the past.  She does not need any psychiatric intervention at this time. Patient does not require inpatient psychiatric services.  Please call [phone] if you have any further questions.  Chace Bisch T. 12/19/2013 12:24 PM

## 2013-12-19 NOTE — Discharge Summary (Signed)
Name: Carol Rangel MRN: [id number] DOB: August 22, 1985 28 y.o. PCP: No Pcp Per Patient  Date of Admission: 12/18/2013 10:38 AM Date of Discharge: 12/19/2013 Attending Physician: Cliffton Asters, MD  Discharge Diagnosis: Principal Problem:   Acute encephalopathy Active Problems:   Decreased responsiveness  Discharge Medications:   Medication List    Notice   You have not been prescribed any medications.      Disposition and follow-up:   Carol Rangel CERMAWI was discharged from [facility] in Good condition.  At the hospital follow up visit please address:  1.  Revisit any social stressors that may have caused her delirium, which she denied throughout hospitalization. May consider further workup for potential Addison's disease if patient's symptoms persist, as this may be associated with periods of catatonia, hypotension, and nausea (not pursued during hospitalization because of normal electrolytes).  2.  Labs / imaging needed at time of follow-up: GC/chlamydia  3.  Pending labs/ test needing follow-up: None   Follow-up Appointments: Internal Medicine Clinic will call patient with appointment  Discharge Instructions:  You have come into the hospital with decreased responsiveness. We have done a series of tests, including imaging of your head, and a variety of blood tests, which did not show any abnormalities. We have also done an EEG which did not show any evidence of seizures. The cause of your symptoms is not quite clear, but we would recommend that you followup with [place] in our outpatient clinic within one to 2 weeks to see if your continuing to improve. Our clinic will call you with an appointment.   Consultations:   Neurology Psychiatry  Procedures Performed:  Ct Head Wo Contrast  12/18/2013   CLINICAL DATA:  Lethargy, altered mental status, nonverbal  EXAM: CT HEAD WITHOUT CONTRAST  TECHNIQUE: Contiguous axial images were obtained from the base of  the skull through the vertex without intravenous contrast.  COMPARISON:  06/30/2011  FINDINGS: There is no evidence of acute intracranial hemorrhage, brain edema, mass lesion, acute infarction, mass effect, or midline shift. Acute infarct may be inapparent on noncontrast CT. No other intra-axial abnormalities are seen, and the ventricles and sulci are within normal limits in size and symmetry. No abnormal extra-axial fluid collections or masses are identified. No significant calvarial abnormality.  IMPRESSION: 1. Negative for bleed or other acute intracranial process.   Electronically Signed   By: Oley Balm M.D.   On: 12/18/2013 12:25   Dg Chest Port 1 View  12/18/2013   CLINICAL DATA:  Two days of chest pain; postprandial vomiting; unresponsive since this morning. ; no history of tobacco use; initial visit  EXAM: PORTABLE CHEST - 1 VIEW  COMPARISON:  PA and lateral chest x-ray of June 12, 2010  FINDINGS: The lungs are well-expanded and clear. The heart and mediastinal structures are normal. The pulmonary vascularity is not engorged. There is no pleural effusion or pneumothorax. The bony thorax is unremarkable.  IMPRESSION: There is no acute cardiopulmonary abnormality.   Electronically Signed   By: David  [place]   On: 12/18/2013 11:37    Admission HPI:   [name] is a 28 year old [place] woman with no significant past medical history who was brought in by family and friends after being less responsive starting this morning.   Her sister and brother-in-law report that before this morning, patient had been complaining of chest pain, abdominal pain, and vomiting over the last week. She has also had decreased by mouth intake. However,  mentally, she was in her usual state until this morning. Initially, she was texting coherently with her friends and family, but eventually, her brother-in-law came to her apartment and found that she was not opening the door. He initially found her laying in bed been  much less interactive and responsive than she is usually.   Family reports that she does not have any significant past medical history, bleeding no history of seizures. They have never witnessed her pass out or have any shaking convulsions. Family denies any urine or soiled clothing when they found her this morning. Family denies any previous episodes that are similar to this one. Family also denies any sources of natural gas for heating purposes in the home. They also deny any significant medications around the house. Patient came to the [place] in [place] from [place]. She had been working at a [employer], quitting her job 7 months ago. Her family denies any significant stressors in her life. Family does not know of any alcohol, tobacco, drug, or sexual activity. Family denies any recent travel outside the country.  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 Workup for organic causes were entirely negative, including electrolytes, blood glucose level, hepatic panel, acetaminophen, alcohol, salicylates, CBC, ABG, chest x-ray, CT head, urine drug screen. Lipase was only mildly elevated at 114. EEG was unremarkable for ongoing seizure activity or regular slow activity. The patient abruptly returned to close to baseline mental status on the morning of 12/18/2013. Neurology was favoring his psychogenic cause and psychiatry was consulted, though no specific recommendations for ongoing management. Etiology is overall unclear, but may consider Addison's disease in the future should the patient have persistent symptoms or electrolyte derangements.  # Malnutrition/orthostatic hypotension: Patient with decreased appetite and poor by mouth intake before admission.  Patient was able to have increased intake after starting ranitidine (see below). She was found to have orthostatic hypotension and was given a 1 L bolus of normal saline. Patient was discharged as she was no longer orthostatic.  # Epigastric pain/nausea/vomiting: initial reports from family was that patient was having chest pain, but patient reported having epigastric pain that is associated with nausea that prevent her from having adequate food intake, instead. Patient not taking any NSAIDs at home, and there is no known family history of cancer. Is likely due to dyspepsia and patient was started on a trial of ranitidine with moderate improvement.  # Prolonged menstrual periods: Patient reports having periods that last 10 days at a time, last period was 12/05/2013, still ongoing upon discharge. There is no evidence of anemia on her labs. Workup was remarkable for bacterial vaginosis and patient was started on vaginal metronidazole for 5 days with an end date of 12/25/2013. Patient may benefit from OB/GYN referral showed her menstrual periods continue to be a problem.  Discharge Vitals:   BP 102/62  Pulse 60  Temp(Src) 98.8 F (37.1 C) (Oral)  Resp 14  Ht 5\' 5"  (1.651 m)  Wt 106 lb 0.7 oz (48.1 kg)  BMI 17.65 kg/m2  SpO2 100%  LMP 12/09/2013  Discharge Labs:  Results for orders placed during the hospital encounter of 12/18/13 (from the past 24 hour(s))  TSH     Status: None   Collection Time    12/18/13  4:14 PM  Result Value Ref Range   TSH 0.787  0.350 - 4.500 uIU/mL  T4, FREE     Status: None   Collection Time    12/18/13  4:14 PM      Result Value Ref Range   Free T4 1.49  0.80 - 1.80 ng/dL  COMPREHENSIVE METABOLIC PANEL     Status: Abnormal   Collection Time    12/19/13  4:00 AM      Result Value Ref Range   Sodium 139  137 - 147 mEq/L   Potassium 3.5 (*) 3.7 - 5.3 mEq/L   Chloride 104  96 - 112 mEq/L   CO2 26  19 - 32 mEq/L   Glucose, Bld 88  70 - 99 mg/dL   BUN 12  6  - 23 mg/dL   Creatinine, Ser 1.61  0.50 - 1.10 mg/dL   Calcium 9.1  8.4 - 09.6 mg/dL   Total Protein 6.9  6.0 - 8.3 g/dL   Albumin 3.5  3.5 - 5.2 g/dL   AST 10  0 - 37 U/L   ALT 6  0 - 35 U/L   Alkaline Phosphatase 51  39 - 117 U/L   Total Bilirubin 0.5  0.3 - 1.2 mg/dL   GFR calc non Af Amer >90  >90 mL/min   GFR calc Af Amer >90  >90 mL/min   Anion gap 9  5 - 15  CBC     Status: Abnormal   Collection Time    12/19/13  4:00 AM      Result Value Ref Range   WBC 7.2  4.0 - 10.5 K/uL   RBC 3.98  3.87 - 5.11 MIL/uL   Hemoglobin 12.0  12.0 - 15.0 g/dL   HCT 04.5 (*) 40.9 - 81.1 %   MCV 88.9  78.0 - 100.0 fL   MCH 30.2  26.0 - 34.0 pg   MCHC 33.9  30.0 - 36.0 g/dL   RDW 91.4  78.2 - 95.6 %   Platelets 203  150 - 400 K/uL    Signed: Harold Barban, MD 12/19/2013, 3:10 PM    Services Ordered on Discharge: None Equipment Ordered on Discharge: None

## 2013-12-20 DIAGNOSIS — N926 Irregular menstruation, unspecified: Secondary | ICD-10-CM | POA: Diagnosis present

## 2013-12-20 DIAGNOSIS — I951 Orthostatic hypotension: Secondary | ICD-10-CM

## 2013-12-20 DIAGNOSIS — B9689 Other specified bacterial agents as the cause of diseases classified elsewhere: Secondary | ICD-10-CM | POA: Diagnosis present

## 2013-12-20 DIAGNOSIS — R112 Nausea with vomiting, unspecified: Secondary | ICD-10-CM | POA: Diagnosis present

## 2013-12-20 DIAGNOSIS — N760 Acute vaginitis: Secondary | ICD-10-CM

## 2013-12-20 DIAGNOSIS — E440 Moderate protein-calorie malnutrition: Secondary | ICD-10-CM | POA: Diagnosis present

## 2013-12-20 DIAGNOSIS — E46 Unspecified protein-calorie malnutrition: Secondary | ICD-10-CM

## 2013-12-20 HISTORY — DX: Nausea with vomiting, unspecified: R11.2

## 2013-12-20 LAB — BASIC METABOLIC PANEL
ANION GAP: 11 (ref 5–15)
BUN: 9 mg/dL (ref 6–23)
CHLORIDE: 102 meq/L (ref 96–112)
CO2: 27 meq/L (ref 19–32)
CREATININE: 0.57 mg/dL (ref 0.50–1.10)
Calcium: 9.4 mg/dL (ref 8.4–10.5)
GFR calc Af Amer: >90 mL/min (ref >90)
GFR calc non Af Amer: >90 mL/min (ref >90)
GLUCOSE: 89 mg/dL (ref 70–99)
Potassium: 3.4 mEq/L — ABNORMAL LOW (ref 3.7–5.3)
Sodium: 140 mEq/L (ref 137–147)

## 2013-12-20 LAB — GC/CHLAMYDIA PROBE AMP
CT Probe RNA: NEGATIVE
GC Probe RNA: NEGATIVE

## 2013-12-20 LAB — HIV ANTIBODY (ROUTINE TESTING W REFLEX): HIV 1&2 Ab, 4th Generation: NONREACTIVE

## 2013-12-20 MED ORDER — BOOST / RESOURCE BREEZE PO LIQD: 1.0000 | Freq: Once | ORAL | Status: DC | PRN

## 2013-12-20 MED ORDER — FAMOTIDINE 20 MG PO TABS: 20.0000 mg | ORAL_TABLET | Freq: Two times a day (BID) | ORAL | Status: DC

## 2013-12-20 MED ORDER — METRONIDAZOLE 0.75 % VA GEL: 1.0000 | Freq: Every day | VAGINAL | Status: DC

## 2013-12-20 MED ORDER — SODIUM CHLORIDE 0.9 % IV BOLUS (SEPSIS)
1000.0000 mL | Freq: Once | INTRAVENOUS | Status: AC
  Administered 2013-12-20: 1000 mL via INTRAVENOUS

## 2013-12-20 MED ORDER — POTASSIUM CHLORIDE CRYS ER 20 MEQ PO TBCR
40.0000 meq | EXTENDED_RELEASE_TABLET | Freq: Once | ORAL | Status: AC
  Administered 2013-12-20: 40 meq via ORAL
  Filled 2013-12-20: qty 2

## 2013-12-20 NOTE — Progress Notes (Signed)
Chart/note reviewed.  Katie Lynae Pederson, RD, LDN Pager #: [phone] After-Hours Pager #: [phone]  

## 2013-12-20 NOTE — Progress Notes (Signed)
Nsg Discharge Note  Admit Date:  12/18/2013 Discharge date: 12/20/2013   Tama Kramar CERMAWI to be D/C'd Home per MD order.  AVS completed.  Copy for chart, and copy for patient signed, and dated. Patient/caregiver able to verbalize understanding.  Discharge Medication:   Medication List         famotidine 20 MG tablet  Commonly known as:  PEPCID  Take 1 tablet (20 mg total) by mouth 2 (two) times daily.     metroNIDAZOLE 0.75 % vaginal gel  Commonly known as:  METROGEL  Place 1 Applicatorful vaginally at bedtime.        Discharge Assessment: Filed Vitals:   12/20/13 1814  BP: 101/60  Pulse:   Temp:   Resp:    Skin clean, dry and intact without evidence of skin break down, no evidence of skin tears noted. IV catheter discontinued intact. Site without signs and symptoms of complications - no redness or edema noted at insertion site, patient denies c/o pain - only slight tenderness at site.  Dressing with slight pressure applied.  D/c Instructions-Education: Discharge instructions given to patient/family with verbalized understanding. D/c education completed with patient/family including follow up instructions, medication list, d/c activities limitations if indicated, with other d/c instructions as indicated by MD - patient able to verbalize understanding, all questions fully answered. Patient instructed to return to ED, call 911, or call MD for any changes in condition.  Patient escorted via WC, and D/C home via private auto.  Kern ReapBrumagin, Tamantha Saline L, RN 12/20/2013 6:24 PM

## 2013-12-20 NOTE — Discharge Instructions (Signed)
You have come into the hospital with decreased responsiveness. We have done a series of tests, including imaging of your head, and a variety of blood tests, which did not show any abnormalities. We have also done an EEG which did not show any evidence of seizures. The cause of your symptoms is not quite clear, but we would recommend that you followup with us in our outpatient clinic within one to 2 weeks to see if your continuing to improve. Our clinic will call you with an appointment.

## 2013-12-20 NOTE — Progress Notes (Signed)
Subjective:  Patient reports that her appetite has improved. On repeat questioning yesterday, patient continues to deny any sexual, physical, or emotional abuse. Patient feels ready to go home.   Objective: Vital signs in last 24 hours: Filed Vitals:   12/19/13 1453 12/19/13 2143 12/20/13 0519 12/20/13 0528  BP: 102/62 99/63 89/48  90/60  Pulse: 60 64 42 64  Temp: 98.8 F (37.1 C) 98.4 F (36.9 C) 97.9 F (36.6 C)   TempSrc: Oral Oral Oral   Resp: 14 15 15    Height:      Weight:      SpO2: 100% 98% 99%    Weight change:   Intake/Output Summary (Last 24 hours) at 12/20/13 0630 Last data filed at 12/19/13 0917  Gross per 24 hour  Intake    240 ml  Output      0 ml  Net    240 ml   General: resting in bed, mildly somnolent HEENT: PERRL, EOMI, no scleral icterus Cardiac: RRR, no rubs, murmurs or gallops Pulm: clear to auscultation bilaterally, moving normal volumes of air Abd: soft, nontender, nondistended, BS present Ext: warm and well perfused, no pedal edema Neuro: alert and oriented X3, cranial nerves II-XII grossly intact Psych: answering questions appropriately  Lab Results: Basic Metabolic Panel:  Recent Labs Lab 12/18/13 1045 12/19/13 0400  NA 136* 139  K 3.7 3.5*  CL 100 104  CO2 23 26  GLUCOSE 110* 88  BUN 10 12  CREATININE 0.61 0.60  CALCIUM 9.8 9.1   Liver Function Tests:  Recent Labs Lab 12/18/13 1117 12/19/13 0400  AST 12 10  ALT 6 6  ALKPHOS 57 51  BILITOT 0.5 0.5  PROT 8.0 6.9  ALBUMIN 4.2 3.5    Recent Labs Lab 12/18/13 1117  LIPASE 114*   No results found for this basename: AMMONIA,  in the last 168 hours CBC:  Recent Labs Lab 12/18/13 1045 12/19/13 0400  WBC 10.1 7.2  HGB 14.2 12.0  HCT 41.4 35.4*  MCV 87.7 88.9  PLT 214 203   Cardiac Enzymes: No results found for this basename: CKTOTAL, CKMB, CKMBINDEX, TROPONINI,  in the last 168 hours BNP:  Recent Labs Lab 12/18/13 1045  PROBNP 11.7   D-Dimer: No  results found for this basename: DDIMER,  in the last 168 hours CBG:  Recent Labs Lab 12/18/13 1056  GLUCAP 102*   Hemoglobin A1C: No results found for this basename: HGBA1C,  in the last 168 hours Fasting Lipid Panel: No results found for this basename: CHOL, HDL, LDLCALC, TRIG, CHOLHDL, LDLDIRECT,  in the last 168 hours Thyroid Function Tests:  Recent Labs Lab 12/18/13 1614  TSH 0.787  FREET4 1.49   Coagulation: No results found for this basename: LABPROT, INR,  in the last 168 hours Anemia Panel: No results found for this basename: VITAMINB12, FOLATE, FERRITIN, TIBC, IRON, RETICCTPCT,  in the last 168 hours Urine Drug Screen: Drugs of Abuse     Component Value Date/Time   LABOPIA NONE DETECTED 12/18/2013 1115   COCAINSCRNUR NONE DETECTED 12/18/2013 1115   LABBENZ NONE DETECTED 12/18/2013 1115   AMPHETMU NONE DETECTED 12/18/2013 1115   THCU NONE DETECTED 12/18/2013 1115   LABBARB NONE DETECTED 12/18/2013 1115    Alcohol Level:  Recent Labs Lab 12/18/13 1117  ETH <11   Urinalysis:  Recent Labs Lab 12/18/13 1118  COLORURINE AMBER*  LABSPEC 1.017  PHURINE 5.5  GLUCOSEU NEGATIVE  HGBUR NEGATIVE  BILIRUBINUR NEGATIVE  KETONESUR 15*  PROTEINUR NEGATIVE  UROBILINOGEN 0.2  NITRITE NEGATIVE  LEUKOCYTESUR TRACE*   Micro Results: Recent Results (from the past 240 hour(s))  WET PREP, GENITAL     Status: Abnormal   Collection Time    12/19/13  4:27 PM      Result Value Ref Range Status   Yeast Wet Prep HPF POC FEW (*) NONE SEEN Final   Trich, Wet Prep NONE SEEN  NONE SEEN Final   Clue Cells Wet Prep HPF POC MODERATE (*) NONE SEEN Final   WBC, Wet Prep HPF POC FEW (*) NONE SEEN Final   Studies/Results: Ct Head Wo Contrast  12/18/2013   CLINICAL DATA:  Lethargy, altered mental status, nonverbal  EXAM: CT HEAD WITHOUT CONTRAST  TECHNIQUE: Contiguous axial images were obtained from the base of the skull through the vertex without intravenous contrast.  COMPARISON:   06/30/2011  FINDINGS: There is no evidence of acute intracranial hemorrhage, brain edema, mass lesion, acute infarction, mass effect, or midline shift. Acute infarct may be inapparent on noncontrast CT. No other intra-axial abnormalities are seen, and the ventricles and sulci are within normal limits in size and symmetry. No abnormal extra-axial fluid collections or masses are identified. No significant calvarial abnormality.  IMPRESSION: 1. Negative for bleed or other acute intracranial process.   Electronically Signed   By: Oley Balm M.D.   On: 12/18/2013 12:25   Dg Chest Port 1 View  12/18/2013   CLINICAL DATA:  Two days of chest pain; postprandial vomiting; unresponsive since this morning. ; no history of tobacco use; initial visit  EXAM: PORTABLE CHEST - 1 VIEW  COMPARISON:  PA and lateral chest x-ray of June 12, 2010  FINDINGS: The lungs are well-expanded and clear. The heart and mediastinal structures are normal. The pulmonary vascularity is not engorged. There is no pleural effusion or pneumothorax. The bony thorax is unremarkable.  IMPRESSION: There is no acute cardiopulmonary abnormality.   Electronically Signed   By: David  [place]   On: 12/18/2013 11:37   Medications: I have reviewed the patient's current medications. Scheduled Meds: . famotidine  20 mg Oral BID  . heparin  5,000 Units Subcutaneous 3 times per day  . Influenza vac split quadrivalent PF  0.5 mL Intramuscular Tomorrow-1000  . metroNIDAZOLE  1 Applicatorful Vaginal QHS   Continuous Infusions:  PRN Meds:.acetaminophen, ibuprofen, promethazine Assessment/Plan: Principal Problem:   Acute encephalopathy Active Problems:   Decreased responsiveness  Carol Rangel is a 28 -year-old woman with no significant past medical history who presents with decreased responsiveness since the morning of 12/18/2013, now resolved, secondary to a psychogenic etiology.  # Decreased responsiveness secondary to psychogenic etiology: Workup  for organic causes have been negative thus far. Electrolytes within normal limits. No hypoglycemia. Hepatic panel within normal limits. No elevated levels of acetaminophen, alcohol, salicylates. CBC and ABG within normal limits. Chest x-ray and CT head without any acute abnormalities. Urine drug screen negative. Lipase only mildly elevated at 114. EEG unremarkable for ongoing seizure activity or regular slow activity. The patient has abruptly returned to a more normal mental state. Patient was previously not responsive to questioning and minimally reactive to noxious stimuli, patient is now conversational and answering questions appropriately. Psychiatry not recommending any further workup or management.   - Patient to followup in outpatient clinic  # Dyspepsia: Contrary to prior reports from family, patient has not been having chest pain but rather epigastric pain that is associated with some nausea that is preventing her from  having adequate food intake. Patient is not taking any NSAIDs at home. No known family history of gastric cancer. No evidence of anemia. Likely secondary to dyspepsia.   - Trial of ranitidine (PPIs not on $4 list)  # Prolonged menstrual periods: Patient reports having periods that last 10 days at a time with foul smell, last period was 12/05/2013, still ongoing currently. There is no evidence from labs of anemia. Wet prep remarkable for clue cells. Pregnancy test negative.  - Started on vaginal metronidazole for bacterial vaginosis for 5 days, given current nausea.  - HIV, GC chlamydia pending, to followup as an outpatient.  - Likely follow up with OB/GYN as an outpatient should symptoms persist.   Dispo: Disposition is deferred at this time, awaiting improvement of current medical problems.  Anticipated discharge in approximately 1 day(s).   The patient does not have a current PCP (No Pcp Per Patient) and does need an [facility] hospital follow-up appointment after discharge.  The  patient does not have transportation limitations that hinder transportation to clinic appointments.  .Services Needed at time of discharge: Y = Yes, Blank = No PT:   OT:   RN:   Equipment:   Other:     LOS: 2 days   Harold Barban, MD 12/20/2013, 6:30 AM  Her nausea and vomiting have improved her by mouth intake is increasing. We have no good explanation for her unresponsive state upon admission. Extensive evaluation has failed to reveal any neurologic, metabolic or psychiatric explanation. I agree with discharge this afternoon.  Cliffton Asters, MD [facility] [phone] pager   [phone] cell 12/20/2013, 1:08 PM

## 2013-12-20 NOTE — Progress Notes (Signed)
RN notified by NT that pt's BP was 89/48 via dinamap and pulse of 42 via dinamap. Manual vitals obtained by RN-BP 90/60, radial pulse 64. RN notified Internal Medicine on-call. Per internal medicine, will continue to monitor patient.

## 2013-12-20 NOTE — Progress Notes (Signed)
INITIAL NUTRITION ASSESSMENT  DOCUMENTATION CODES Per approved criteria  -Non-severe (moderate) malnutrition in the context of acute illness or injury  Pt meets criteria for moderate MALNUTRITION in the context of acute illness as evidenced by wt loss of 12% x 1 week and energy intake <75% for >7 days.  INTERVENTION:  Provide Resource Breeze po PRN, each supplement provides 250 kcal and 9 grams of protein  RD to continue to monitor  NUTRITION DIAGNOSIS: Inadequate oral intake related to N/V, epigastric pain as evidenced by poor PO intake <25% >1 week and 12 % wt loss x 1 week.   Goal: Pt to meet >/= 90% of their estimated nutrition needs   Monitor:  PO and supplemental intake, weight, labs, I/O's  Reason for Assessment: Pt identified as at nutrition risk on the Malnutrition Screen Tool  Admitting Dx: Acute encephalopathy  ASSESSMENT: 28 year old [place] woman, had been complaining of chest pain, abdominal pain, and vomiting over the last week. She has also had decreased by mouth intake.Patient states that she has been experiencing painful periods for the past 5 years.  She says that within the last week she has experienced early satiety with meals and also has so epigastric pain and when she tries to eat normally she becomes nauseated. The nausea is only improved by eating or drinking very small amounts or completely abstaining from food. Today she reports that meats and cream based food smells induce nausea.   PO intake 50-75%, tolerating clear liquids, advanced to soft diet. Pt reports decreased appetite, the past week she has only been able to tolerate small amounts of food,  only eating a small bit of rice/porridge or milk in one day. She particularly cannot tolerate meats. For lunch she was able to eat a little meat, vegetables and mashed potatoes.   Pt states that she should be going home today so she refused to receive Resource Breeze supplement.   Pt reports weight loss of  10 lb/ 1 week. UBW is 120 lb, current weight is 106 lb (12% wt loss x 1 week).   Nutrition focused physical exam shows no sign of depletion of muscle mass or body fat.  Labs reviewed: Low K  Height: Ht Readings from Last 1 Encounters:  12/18/13 5\' 5"  (1.651 m)    Weight: Wt Readings from Last 1 Encounters:  12/18/13 106 lb 0.7 oz (48.1 kg)    Ideal Body Weight: 125 lb  % Ideal Body Weight: 85%  Wt Readings from Last 10 Encounters:  12/18/13 106 lb 0.7 oz (48.1 kg)    Usual Body Weight: 120 lb  % Usual Body Weight: 88%  BMI:  Body mass index is 17.65 kg/(m^2).  Estimated Nutritional Needs: Kcal: 1700-1800 Protein: 75-85g Fluid: 1.7L/day  Skin: intact  Diet Order: Parke Simmers  EDUCATION NEEDS: -No education needs identified at this time   Intake/Output Summary (Last 24 hours) at 12/20/13 1117 Last data filed at 12/20/13 0957  Gross per 24 hour  Intake    240 ml  Output      0 ml  Net    240 ml    Last BM: PTA  Labs:   Recent Labs Lab 12/18/13 1045 12/19/13 0400 12/20/13 0530  NA 136* 139 140  K 3.7 3.5* 3.4*  CL 100 104 102  CO2 23 26 27   BUN 10 12 9   CREATININE 0.61 0.60 0.57  CALCIUM 9.8 9.1 9.4  GLUCOSE 110* 88 89    CBG (last 3)  Recent Labs  12/18/13 1056  GLUCAP 102*    Scheduled Meds: . famotidine  20 mg Oral BID  . heparin  5,000 Units Subcutaneous 3 times per day  . Influenza vac split quadrivalent PF  0.5 mL Intramuscular Tomorrow-1000  . metroNIDAZOLE  1 Applicatorful Vaginal QHS    Continuous Infusions:   History reviewed. No pertinent past medical history.  History reviewed. No pertinent past surgical history.  Tilda FrancoLindsey Bibi Economos, MS, RD, [facility] Provisionally Licensed Dietitian Nutritionist Pager: [phone]

## 2014-01-03 ENCOUNTER — Encounter: Payer: Self-pay | Admitting: Internal Medicine

## 2014-01-03 ENCOUNTER — Ambulatory Visit (INDEPENDENT_AMBULATORY_CARE_PROVIDER_SITE_OTHER): Payer: Self-pay | Admitting: Internal Medicine

## 2014-01-03 VITALS — BP 104/62 | HR 62 | Temp 98.0°F | Ht 65.0 in | Wt 112.1 lb

## 2014-01-03 DIAGNOSIS — I951 Orthostatic hypotension: Secondary | ICD-10-CM

## 2014-01-03 DIAGNOSIS — N926 Irregular menstruation, unspecified: Secondary | ICD-10-CM

## 2014-01-03 MED ORDER — MECLIZINE HCL 12.5 MG PO TABS: 12.5000 mg | ORAL_TABLET | Freq: Three times a day (TID) | ORAL | Status: DC | PRN

## 2014-01-03 MED ORDER — SODIUM CHLORIDE 0.9 % IV SOLN
Freq: Once | INTRAVENOUS | Status: AC
  Administered 2014-01-03: 16:00:00 via INTRAVENOUS

## 2014-01-03 MED ORDER — PROMETHAZINE HCL 12.5 MG PO TABS: 12.5000 mg | ORAL_TABLET | Freq: Four times a day (QID) | ORAL | Status: DC | PRN

## 2014-01-03 NOTE — Progress Notes (Signed)
Subjective:   Patient ID: Carol Rangel female   DOB: 11/24/1985 28 y.o.   MRN: [id number]  HPI: [name] is a 28 y.o. female who is new to our clinic.  She was recently discharged from the hospital 12/19/13 for acute encephalopathy and decreased responsiveness.  Work up for organic causes thus far has been negative and was apparently almost back to baseline by discharge.  Neurology and psychiatry were consulted and thought that symptoms favoring psychogenic etiology but may consider Addison's disease if persistent.    Dizziness--today claims still dizzy at times but description is more consistent with light-headedness.  Noted to be orthostatic in the hospital that improved with IVF and improved PO intake. Today in opc initially also orthostatic based on HR with dizziness when changing positions. Admits to decreased po intake and not wanting to eat but forcing herself to eat and drink at times.  Associated with fatigue, nausea and vomiting, last episode of vomiting prior to coming for appointment. No prior similar episodes other than the one leading to admission. Denies sick contacts, recent travel outside of the country. She does not work and is not in school.   BV: discharged with flagyl to end 10/10  No past medical history on file. Current Outpatient Prescriptions  Medication Sig Dispense Refill  . famotidine (PEPCID) 20 MG tablet Take 1 tablet (20 mg total) by mouth 2 (two) times daily.  60 tablet  2  . metroNIDAZOLE (METROGEL) 0.75 % vaginal gel Place 1 Applicatorful vaginally at bedtime.  70 g  0   No current facility-administered medications for this visit.   No family history on file. History   Social History  . Marital Status: Single    Spouse Name: N/A    Number of Children: N/A  . Years of Education: N/A   Social History Main Topics  . Smoking status: Never Smoker   . Smokeless tobacco: Not on file  . Alcohol Use: No  . Drug Use:   . Sexual Activity:      Other Topics Concern  . Not on file   Social History Narrative  . No narrative on file   Review of Systems:  Constitutional:  Decreased appetite, fatigue  HEENT:  Denies congestion  Respiratory:  Denies SOB   Cardiovascular:  Denies chest pain. +palpitations at times.   Gastrointestinal:  Nausea and vomiting. Denies abdominal pain, diarrhea, or constipation  Genitourinary:  Denies dysuria.   Musculoskeletal:  Denies myalgias   Skin:  Denies pallor, rash and wound.   Neurological:  Dizziness and lightheadedness   Objective:  Physical Exam: Filed Vitals:   01/03/14 1434 01/03/14 1635 01/03/14 1639 01/03/14 1644  BP: 97/62 105/57 103/61 104/62  Pulse: 126 56 67 62  Temp:      TempSrc:      Height:      Weight:      SpO2:       Vitals reviewed. General: sitting in chair, NAD but feels light-headed HEENT: EOMI, wax present in b/l ears but no visible masses, inflammation, or drainage Cardiac: RRR Pulm: clear to auscultation bilaterally, no wheezes, rales, or rhonchi Abd: soft, nontender, nondistended, BS present Ext: warm and well perfused, no pedal edema, +2DP B/L Neuro: alert and oriented X3, strength and sensation to light touch equal in bilateral upper and lower extremities, dizziness noted with movement of head up and down and less when right to left, finger to nose testing intact, gait stable but walks with left  foot facing inwards more than right. [employer] with no nystagmus noted but she did endorse feeling dizzy  Assessment & Plan:  Discussed with Dr. Josem KaufmannKlima Orthostatic hypotension--resolved after 1L NS bolus Phenergan prn and meclizine prn if phenergan fails Pharmacist, communityBurmese translator for next visit if possible

## 2014-01-03 NOTE — Patient Instructions (Signed)
General Instructions:   Please bring your medicines with you each time you come to clinic.  Medicines may include prescription medications, over-the-counter medications, herbal remedies, eye drops, vitamins, or other pills.  Please drink plenty of water and try eating  You can take phenergan 12.5mg  by mouth every 6 hours as needed to help with nausea  You can also try meclizine up to three times a day as needed to help with dizziness   Do not take these medications if feeling sleepy and driving or operating heavy machinery  Please avoid driving until your symptoms get better  Return to clinic in one week or sooner if symptoms do not improve or get worse  Progress Toward Treatment Goals:  No flowsheet data found.  Self Care Goals & Plans:  No flowsheet data found.  No flowsheet data found.   Care Management & Community Referrals:  No flowsheet data found.   Promethazine tablets What is this medicine? PROMETHAZINE (proe METH a zeen) is an antihistamine. It is used to treat allergic reactions and to treat or prevent nausea and vomiting from illness or motion sickness. It is also used to make you sleep before surgery, and to help treat pain or nausea after surgery. This medicine may be used for other purposes; ask your health care provider or pharmacist if you have questions. COMMON BRAND NAME(S): Phenergan What should I tell my health care provider before I take this medicine? They need to know if you have any of these conditions: -glaucoma -high blood pressure or heart disease -kidney disease -liver disease -lung or breathing disease, like asthma -prostate trouble -pain or difficulty passing urine -seizures -an unusual or allergic reaction to promethazine or phenothiazines, other medicines, foods, dyes, or preservatives -pregnant or trying to get pregnant -breast-feeding How should I use this medicine? Take this medicine by mouth with a glass of water. Follow the  directions on the prescription label. Take your doses at regular intervals. Do not take your medicine more often than directed. Talk to your pediatrician regarding the use of this medicine in children. Special care may be needed. This medicine should not be given to infants and children younger than 30 years old. Overdosage: If you think you have taken too much of this medicine contact a poison control center or emergency room at once. NOTE: This medicine is only for you. Do not share this medicine with others. What if I miss a dose? If you miss a dose, take it as soon as you can. If it is almost time for your next dose, take only that dose. Do not take double or extra doses. What may interact with this medicine? Do not take this medicine with any of the following medications: -cisapride -dofetilide -dronedarone -MAOIs like Carbex, Eldepryl, Marplan, Nardil, Parnate -pimozide -quinidine, including dextromethorphan; quinidine -thioridazine -ziprasidone This medicine may also interact with the following medications: -certain medicines for depression, anxiety, or psychotic disturbances -certain medicines for anxiety or sleep -certain medicines for seizures like carbamazepine, phenobarbital, phenytoin -certain medicines for movement abnormalities as in Parkinson's disease, or for gastrointestinal problems -epinephrine -medicines for allergies or colds -muscle relaxants -narcotic medicines for pain -other medicines that prolong the QT interval (cause an abnormal heart rhythm) -tramadol -trimethobenzamide This list may not describe all possible interactions. Give your health care provider a list of all the medicines, herbs, non-prescription drugs, or dietary supplements you use. Also tell them if you smoke, drink alcohol, or use illegal drugs. Some items may interact with your medicine.  What should I watch for while using this medicine? Tell your doctor or health care professional if your  symptoms do not start to get better in 1 to 2 days. You may get drowsy or dizzy. Do not drive, use machinery, or do anything that needs mental alertness until you know how this medicine affects you. To reduce the risk of dizzy or fainting spells, do not stand or sit up quickly, especially if you are an older patient. Alcohol may increase dizziness and drowsiness. Avoid alcoholic drinks. Your mouth may get dry. Chewing sugarless gum or sucking hard candy, and drinking plenty of water may help. Contact your doctor if the problem does not go away or is severe. This medicine may cause dry eyes and blurred vision. If you wear contact lenses you may feel some discomfort. Lubricating drops may help. See your eye doctor if the problem does not go away or is severe. This medicine can make you more sensitive to the sun. Keep out of the sun. If you cannot avoid being in the sun, wear protective clothing and use sunscreen. Do not use sun lamps or tanning beds/booths. If you are diabetic, check your blood-sugar levels regularly. What side effects may I notice from receiving this medicine? Side effects that you should report to your doctor or health care professional as soon as possible: -blurred vision -irregular heartbeat, palpitations or chest pain -muscle or facial twitches -pain or difficulty passing urine -seizures -skin rash -slowed or shallow breathing -unusual bleeding or bruising -yellowing of the eyes or skin Side effects that usually do not require medical attention (report to your doctor or health care professional if they continue or are bothersome): -headache -nightmares, agitation, nervousness, excitability, not able to sleep (these are more likely in children) -stuffy nose This list may not describe all possible side effects. Call your doctor for medical advice about side effects. You may report side effects to FDA at 1-800-FDA-1088. Where should I keep my medicine? Keep out of the reach of  children. Store at room temperature, between 20 and 25 degrees C (68 and 77 degrees F). Protect from light. Throw away any unused medicine after the expiration date. NOTE: This sheet is a summary. It may not cover all possible information. If you have questions about this medicine, talk to your doctor, pharmacist, or health care provider.  2015, Elsevier/Gold Standard. (2012-11-03 15:04:46)  Meclizine tablets or capsules What is this medicine? MECLIZINE (MEK li zeen) is an antihistamine. It is used to prevent nausea, vomiting, or dizziness caused by motion sickness. It is also used to prevent and treat vertigo (extreme dizziness or a feeling that you or your surroundings are tilting or spinning around). This medicine may be used for other purposes; ask your health care provider or pharmacist if you have questions. COMMON BRAND NAME(S): Antivert, Dramamine Less Drowsy, Medivert, Meni-D What should I tell my health care provider before I take this medicine? They need to know if you have any of these conditions: -asthma -glaucoma -prostate trouble -stomach problems -urinary problems -an unusual or allergic reaction to meclizine, other medicines, foods, dyes, or preservatives -pregnant or trying to get pregnant -breast-feeding How should I use this medicine? Take this medicine by mouth with a glass of water. Follow the directions on the prescription label. If you are using this medicine to prevent motion sickness, take the dose at least 1 hour before travel. If it upsets your stomach, take it with food or milk. Take your doses at regular intervals.  Do not take your medicine more often than directed. Talk to your pediatrician regarding the use of this medicine in children. Special care may be needed. Overdosage: If you think you have taken too much of this medicine contact a poison control center or emergency room at once. NOTE: This medicine is only for you. Do not share this medicine with  others. What if I miss a dose? If you miss a dose, take it as soon as you can. If it is almost time for your next dose, take only that dose. Do not take double or extra doses. What may interact with this medicine? -barbiturate medicines for inducing sleep or treating seizures -digoxin -medicines for anxiety or sleeping problems, like alprazolam, diazepam or temazepam -medicines for hay fever and other allergies -medicines for mental depression -medicines for movement abnormalities as in Parkinson's disease, or for stomach problems -medicines for pain -medicines that relax muscles This list may not describe all possible interactions. Give your health care provider a list of all the medicines, herbs, non-prescription drugs, or dietary supplements you use. Also tell them if you smoke, drink alcohol, or use illegal drugs. Some items may interact with your medicine. What should I watch for while using this medicine? If you are taking this medicine on a regular schedule, visit your doctor or health care professional for regular checks on your progress. You may get dizzy, drowsy or have blurred vision. Do not drive, use machinery, or do anything that needs mental alertness until you know how this medicine affects you. Do not stand or sit up quickly, especially if you are an older patient. This reduces the risk of dizzy or fainting spells. Alcohol can increase possible dizziness. Avoid alcoholic drinks. Your mouth may get dry. Chewing sugarless gum or sucking hard candy, and drinking plenty of water may help. Contact your doctor if the problem does not go away or is severe. This medicine may cause dry eyes and blurred vision. If you wear contact lenses you may feel some discomfort. Lubricating drops may help. See your eye doctor if the problem does not go away or is severe. What side effects may I notice from receiving this medicine? Side effects that you should report to your doctor or health care  professional as soon as possible: -fainting spells -fast or irregular heartbeat Side effects that usually do not require medical attention (report to your doctor or health care professional if they continue or are bothersome): -constipation -difficulty passing urine -difficulty sleeping -headache -stomach upset This list may not describe all possible side effects. Call your doctor for medical advice about side effects. You may report side effects to FDA at 1-800-FDA-1088. Where should I keep my medicine? Keep out of the reach of children. Store at room temperature between 15 and 30 degrees C (59 and 86 degrees F). Keep container tightly closed. Throw away any unused medicine after the expiration date. NOTE: This sheet is a summary. It may not cover all possible information. If you have questions about this medicine, talk to your doctor, pharmacist, or health care provider.  2015, Elsevier/Gold Standard. (2007-09-10 10:35:36)

## 2014-01-04 DIAGNOSIS — I951 Orthostatic hypotension: Secondary | ICD-10-CM | POA: Insufficient documentation

## 2014-01-04 NOTE — Assessment & Plan Note (Signed)
Consider referring to GYN once orange card in place vs. papsmear in opc on next visit if she is willing

## 2014-01-04 NOTE — Assessment & Plan Note (Addendum)
Likely secondary to volume depletion and dehydration in setting of decreased po intake.  Negative organic work up this far. Consider psychiatric component--perhaps outside pressures/stress?  Initially orthostatic in clinic but improved with 1L NS bolus. Recommended to try to eat and drink when possible to keep up po intake.   -will try symptomatic relief for now with phenergan prn, if no relief with phenergan, can try meclizine. Advised not to drive while she is feeling dizzy and light-headed and if this medications make her drowsy no driving or operating heavy machinery -RTC ~1 week

## 2014-01-05 NOTE — Progress Notes (Signed)
Case discussed with Dr. Qureshi at time of visit.  We reviewed the resident's history and exam and pertinent patient test results.  I agree with the assessment, diagnosis, and plan of care documented in the resident's note. 

## 2014-01-10 ENCOUNTER — Ambulatory Visit (INDEPENDENT_AMBULATORY_CARE_PROVIDER_SITE_OTHER): Payer: Self-pay | Admitting: Internal Medicine

## 2014-01-10 ENCOUNTER — Encounter: Payer: Self-pay | Admitting: Internal Medicine

## 2014-01-10 VITALS — BP 102/65 | HR 116 | Temp 98.2°F | Wt 112.1 lb

## 2014-01-10 DIAGNOSIS — I951 Orthostatic hypotension: Secondary | ICD-10-CM

## 2014-01-10 NOTE — Patient Instructions (Signed)
General Instructions: Please make sure to drink and eat more  Please talk to our financial Counsellor  We will consider to schedule for a special tests for adrenal insuffiencey Please come back in 2 weeks Please bring your medicines with you each time you come to clinic.  Medicines may include prescription medications, over-the-counter medications, herbal remedies, eye drops, vitamins, or other pills.   Progress Toward Treatment Goals:  No flowsheet data found.  Self Care Goals & Plans:  No flowsheet data found.  No flowsheet data found.   Care Management & Community Referrals:  No flowsheet data found.

## 2014-01-11 NOTE — Progress Notes (Signed)
Patient ID: Carol Rangel, female   DOB: [birth year], 28 y.o.   MRN: [id number]   Subjective:   HPI: [name] is a 28 y.o. woman without significant past medical history except recent problems with acute encephalopathy and dizziness. Patient was discharged from the hospital on 12/19/2013 for acute encephalopathy and decreased responsiveness. Workup for an organic causes was negative.  She was evaluated in the clinic for hospital follow-up on 01/03/2014 where she still felt dizzy. She was orthostatic during that visit and she improved after 1 L of normal saline administered from the clinic. She was also given a prescription of Meclizine and Phenergan. She reports that she has been taking these medications. However, her symptoms of dizziness have somehow persisted even though better compared to her last visit. She reports that her intake has been somehow limited by nausea and vomiting but this too is better compared compared to last week. She last vomited yesterday x1. She has not had any episodes of decreased responsiveness. She presents to the clinic unaccompanied. Patient reports that her dizziness is mostly increased when she stands up but she has not passed out. Patient is currently without health insurance and she has received more than $10,000 in hospital bills. She will be seeing our clinic financial counselor, Rudell CobbDeborah Hill.   ROS: Constitutional: Denies fever, chills, diaphoresis Respiratory: Denies SOB, DOE, cough, chest tightness, and wheezing. Denies chest pain. CVS: No chest pain, palpitations and leg swelling.  GI: No abdominal pain, nausea, vomiting, bloody stools GU: No dysuria, frequency, hematuria, or flank pain.  MSK: No myalgias, back pain, joint swelling, arthralgias  Psych: No depression symptoms. No SI or SA.    Objective:  Physical Exam: Filed Vitals:   01/10/14 1515 01/10/14 1553 01/10/14 1556  BP: 107/67 114/59 102/65  Pulse: 90 71 116  Temp: 98.2 F  (36.8 C)    TempSrc: Oral    Weight: 112 lb 1.6 oz (50.848 kg)    SpO2: 99%    On standing up her pulse increased from 71 to 116. She was only mildly dizzy General: Well nourished. No acute distress.  HEENT: Normal oral mucosa. MMM.  Lungs: CTA bilaterally. Heart: RRR; no extra sounds or murmurs  Abdomen: Non-distended, normal bowel sounds, soft, nontender; no hepatosplenomegaly  Extremities: No pedal edema. No joint swelling or tenderness. Neurologic: Normal EOM,  Alert and oriented x3. No obvious neurologic/cranial nerve deficits.  Assessment & Plan:  Discussed case with my attending in the clinic, Dr. Josem KaufmannKlima. See problem based charting.

## 2014-01-11 NOTE — Progress Notes (Signed)
Case discussed with Dr. Kazibwe at time of visit. We reviewed the resident's history and exam and pertinent patient test results. I agree with the assessment, diagnosis, and plan of care documented in the resident's note. 

## 2014-01-11 NOTE — Assessment & Plan Note (Addendum)
Assessment: Most likely diagnosis: Dehydration from low fluid intake in the setting of continuing vomiting. Patient reports that she vomited yesterday 1. She also mentions that she feels her appetite is low. She is not orthostatic in the office. However, her pulse rate increased from 71 to 116 from lying to standing position. Her blood pressure did not significantly change. Ddx: Eating disorder cannot be excluded. I'm also concerned about underlying adrenal insufficiency. Currently, patient is without health insurance and she has been unable to pay previous hospital bills, which are in excess of $10,000. She is concerned about further testing and the related costs. She is out of job for the past 7 months.    Plan: 1. Labs/imaging: Deferred at this time.  2. Therapy: Encouraged patient to increase her fluid intake 3. Follow up: She will need cosyntropin stimulation test. After discussion with the clinic staff, it appears the test will necessiate admission to the short stay area, which will come with further costs for the patient. I recommended that she increases her fluid intake and be evaluated within 2 weeks. The hope will be that at around that time, her orange card will have kicked in and hopefully we can pursue cosyntropin stimulation test at that time.

## 2018-09-12 ENCOUNTER — Ambulatory Visit (HOSPITAL_COMMUNITY)
Admission: EM | Admit: 2018-09-12 | Discharge: 2018-09-12 | Disposition: A | Discharge status: Home / Self Care | Payer: Self-pay | Attending: Physician Assistant | Admitting: Physician Assistant

## 2018-09-12 ENCOUNTER — Other Ambulatory Visit: Payer: Self-pay

## 2018-09-12 ENCOUNTER — Encounter (HOSPITAL_COMMUNITY): Payer: Self-pay | Admitting: Emergency Medicine

## 2018-09-12 DIAGNOSIS — R42 Dizziness and giddiness: Secondary | ICD-10-CM

## 2018-09-12 MED ORDER — MECLIZINE HCL 12.5 MG PO TABS: 12.5000 mg | ORAL_TABLET | Freq: Three times a day (TID) | ORAL | 0 refills | Status: DC | PRN

## 2018-09-12 NOTE — Discharge Instructions (Signed)
Start meclizine as needed. Keep hydrated, urine should be clear to pale yellow in color. Continue to monitor symptoms. If experiencing worsening symptoms, passing out, confusion, go to the ED for further evaluation needed.

## 2018-09-12 NOTE — ED Triage Notes (Signed)
Pt here for dizziness x 3 days worse with position change and sudden movement

## 2018-09-12 NOTE — ED Provider Notes (Signed)
[facility]    CSN: [id number] Arrival date & time: 09/12/18  1115     History   Chief Complaint Chief Complaint  Patient presents with  . Dizziness    HPI Carol Rangel is a 33 y.o. female.   33 year old female comes in for 3 day history of dizziness. States feels "hazy" in addition to feeling room spinning, which is intermittent, worsened with head movement. Denies head injury, loss of consciousness. Denies one sided weakness, lightheadedness, syncope. Denies URI symptoms such as cough, congestion, sore throat. Denies fever, chills, night sweats. Denies abdominal pain. Does have mild nausea if significant dizziness. Denies vomiting. Denies urinary symptoms such as frequency, dysuria, hematuria. LMP 08/03/2018, cycles are irregular. Not currently sexually active.      History reviewed. No pertinent past medical history.  Patient Active Problem List   Diagnosis Date Noted  . Orthostatic hypotension 01/04/2014  . Bacterial vaginosis 12/20/2013  . Irregular menstrual cycle 12/20/2013  . Nausea with vomiting 12/20/2013  . Malnutrition of moderate degree (HCC) 12/20/2013  . Acute encephalopathy 12/18/2013    Past Surgical History:  Procedure Laterality Date  . BREAST SURGERY Right    ?abscess surgery x2    OB History   No obstetric history on file.      Home Medications    Prior to Admission medications   Medication Sig Start Date End Date Taking? Authorizing Provider  famotidine (PEPCID) 20 MG tablet Take 1 tablet (20 mg total) by mouth 2 (two) times daily. 12/20/13   Harold BarbanNgo, Lawrence, MD  meclizine (ANTIVERT) 12.5 MG tablet Take 1 tablet (12.5 mg total) by mouth 3 (three) times daily as needed for dizziness or nausea. 09/12/18   Belinda FisherYu, Akeem Heppler V, PA-C  promethazine (PHENERGAN) 12.5 MG tablet Take 1 tablet (12.5 mg total) by mouth every 6 (six) hours as needed for nausea or vomiting. 01/03/14   Baltazar ApoQureshi, Samaya J, MD    Family History Family History  Problem Relation  Age of Onset  . Cancer Maternal Grandmother     Social History Social History   Tobacco Use  . Smoking status: Never Smoker  . Smokeless tobacco: Never Used  Substance Use Topics  . Alcohol use: No  . Drug use: No     Allergies   Patient has no known allergies.   Review of Systems Review of Systems  Reason unable to perform ROS: See HPI as above.     Physical Exam Triage Vital Signs ED Triage Vitals  Enc Vitals Group     BP 09/12/18 1140 122/74     Pulse Rate 09/12/18 1140 74     Resp 09/12/18 1140 18     Temp 09/12/18 1140 98.8 F (37.1 C)     Temp Source 09/12/18 1140 Oral     SpO2 09/12/18 1140 98 %     Weight --      Height --      Head Circumference --      Peak Flow --      Pain Score 09/12/18 1141 0     Pain Loc --      Pain Edu? --      Excl. in GC? --    No data found.  Updated Vital Signs BP 122/74 (BP Location: Right Arm)   Pulse 74   Temp 98.8 F (37.1 C) (Oral)   Resp 18   SpO2 98%   Physical Exam Constitutional:      General: She is not  in acute distress.    Appearance: Normal appearance. She is not ill-appearing, toxic-appearing or diaphoretic.  HENT:     Head: Normocephalic and atraumatic.     Mouth/Throat:     Mouth: Mucous membranes are moist.     Pharynx: Oropharynx is clear. Uvula midline.  Eyes:     Extraocular Movements: Extraocular movements intact.     Right eye: No nystagmus.     Left eye: No nystagmus.     Conjunctiva/sclera: Conjunctivae normal.     Pupils: Pupils are equal, round, and reactive to light.  Neck:     Musculoskeletal: Normal range of motion and neck supple.  Cardiovascular:     Rate and Rhythm: Normal rate and regular rhythm.     Heart sounds: Normal heart sounds. No murmur. No friction rub. No gallop.   Pulmonary:     Effort: Pulmonary effort is normal. No accessory muscle usage, prolonged expiration, respiratory distress or retractions.     Comments: Lungs clear to auscultation without  adventitious lung sounds. Neurological:     General: No focal deficit present.     Mental Status: She is alert and oriented to person, place, and time.     GCS: GCS eye subscore is 4. GCS verbal subscore is 5. GCS motor subscore is 6.     Cranial Nerves: Cranial nerves are intact.     Sensory: Sensation is intact.     Motor: Motor function is intact.     Coordination: Coordination is intact.     Gait: Gait is intact.      UC Treatments / Results  Labs (all labs ordered are listed, but only abnormal results are displayed) Labs Reviewed - No data to display  EKG None  Radiology No results found.  Procedures Procedures (including critical care time)  Medications Ordered in UC Medications - No data to display  Initial Impression / Assessment and Plan / UC Course  I have reviewed the triage vital signs and the nursing notes.  Pertinent labs & imaging results that were available during my care of the patient were reviewed by me and considered in my medical decision making (see chart for details).    No alarming sign on exam. Discussed possible vertigo causing symptoms, no nystagmus on exam. Will provide meclizine at this time. Push fluids. Return precautions given. Patient expresses understanding and agrees to plan.  Final Clinical Impressions(s) / UC Diagnoses   Final diagnoses:  Vertigo    ED Prescriptions    Medication Sig Dispense Auth. Provider   meclizine (ANTIVERT) 12.5 MG tablet Take 1 tablet (12.5 mg total) by mouth 3 (three) times daily as needed for dizziness or nausea. 30 tablet Tobin Chad, [place] 09/12/18 1255

## 2019-03-19 NOTE — L&D Delivery Note (Signed)
OB[facility]N Faculty Practice Delivery Note  Carol Rangel is a 34 y.o. G1P0 s/p SVD at [redacted]w[redacted]d. She was admitted for spontaneous labor.   ROM: 1h 9m with clear fluid GBS Status:  Negative/-- (10/01 1149) Maximum Maternal Temperature: n/a  Labor Progress: . Initial SVE: 4/90/-2. She then progressed to complete.   Delivery Date/Time: 10/19 at 1815 Delivery: Called to room and patient was complete and pushing. Head delivered OA. Loose nuchal cord present. Shoulder and body delivered in usual fashion. Infant with spontaneous cry, placed on mother's abdomen, dried and stimulated. Cord clamped x 2 after 1-minute delay, and cut by FOB. Cord blood drawn. Placenta delivered spontaneously with gentle cord traction. Fundus firm with massage and Pitocin. Labia, perineum, vagina, and cervix inspected inspected with large second degree laceration, repaired with 0 vicryl and 3 0 vicryl. Patient had notable heavy bleeding, primarily from laceration. She had continued oozing from suture placement. She received 1g TXA and 800 mcg rectal cytotec.  CBC, coags pending.    Baby Weight: pending  Placenta: Sent to L&D Complications: PPH, EBL 1300 cc, s/p cytotec & TXA  Lacerations: large second degree, repaired  EBL: 1300 mL Analgesia: Epidural and lidocaine    Infant:  APGAR (1 MIN): 9   APGAR (5 MINS): 9   APGAR (10 MINS):     Casper Harrison, MD [facility] Family Medicine Fellow, [facility], [facility] 01/04/2020, 7:33 PM

## 2019-06-24 ENCOUNTER — Ambulatory Visit: Payer: Self-pay

## 2019-08-05 ENCOUNTER — Inpatient Hospital Stay (HOSPITAL_COMMUNITY)
Admission: EM | Admit: 2019-08-05 | Discharge: 2019-08-05 | Disposition: A | Discharge status: Home / Self Care | Payer: Medicaid Other | Attending: Obstetrics and Gynecology | Admitting: Obstetrics and Gynecology

## 2019-08-05 ENCOUNTER — Inpatient Hospital Stay (HOSPITAL_BASED_OUTPATIENT_CLINIC_OR_DEPARTMENT_OTHER): Payer: Medicaid Other

## 2019-08-05 ENCOUNTER — Encounter (HOSPITAL_COMMUNITY): Payer: Self-pay | Admitting: Obstetrics and Gynecology

## 2019-08-05 ENCOUNTER — Other Ambulatory Visit: Payer: Self-pay

## 2019-08-05 ENCOUNTER — Inpatient Hospital Stay (HOSPITAL_COMMUNITY): Payer: Medicaid Other

## 2019-08-05 DIAGNOSIS — O26892 Other specified pregnancy related conditions, second trimester: Secondary | ICD-10-CM

## 2019-08-05 DIAGNOSIS — O26832 Pregnancy related renal disease, second trimester: Secondary | ICD-10-CM | POA: Diagnosis not present

## 2019-08-05 DIAGNOSIS — Z79899 Other long term (current) drug therapy: Secondary | ICD-10-CM | POA: Diagnosis not present

## 2019-08-05 DIAGNOSIS — Q6102 Congenital multiple renal cysts: Secondary | ICD-10-CM

## 2019-08-05 DIAGNOSIS — Z3A17 17 weeks gestation of pregnancy: Secondary | ICD-10-CM | POA: Diagnosis not present

## 2019-08-05 DIAGNOSIS — R109 Unspecified abdominal pain: Secondary | ICD-10-CM | POA: Insufficient documentation

## 2019-08-05 DIAGNOSIS — R102 Pelvic and perineal pain: Secondary | ICD-10-CM

## 2019-08-05 DIAGNOSIS — O99891 Other specified diseases and conditions complicating pregnancy: Secondary | ICD-10-CM | POA: Diagnosis not present

## 2019-08-05 DIAGNOSIS — N949 Unspecified condition associated with female genital organs and menstrual cycle: Secondary | ICD-10-CM

## 2019-08-05 DIAGNOSIS — O99892 Other specified diseases and conditions complicating childbirth: Secondary | ICD-10-CM

## 2019-08-05 DIAGNOSIS — N281 Cyst of kidney, acquired: Secondary | ICD-10-CM | POA: Diagnosis not present

## 2019-08-05 LAB — WET PREP, GENITAL
Sperm: NONE SEEN
Trich, Wet Prep: NONE SEEN
Yeast Wet Prep HPF POC: NONE SEEN

## 2019-08-05 LAB — URINALYSIS, ROUTINE W REFLEX MICROSCOPIC
Bilirubin Urine: NEGATIVE
Glucose, UA: NEGATIVE mg/dL
Hgb urine dipstick: NEGATIVE
Ketones, ur: NEGATIVE mg/dL
Leukocytes,Ua: NEGATIVE
Nitrite: NEGATIVE
Protein, ur: NEGATIVE mg/dL
Specific Gravity, Urine: 1.009 (ref 1.005–1.030)
pH: 6 (ref 5.0–8.0)

## 2019-08-05 IMAGING — US US ABDOMEN COMPLETE
1 series · 15 of 25 positions shown · non-contrast
Comparison: None.

CLINICAL DATA: Abdominal pain.

EXAM:
ABDOMEN ULTRASOUND COMPLETE

[Series 1: us abdomen complete · 15 of 125 slices shown]
[im 1/125]
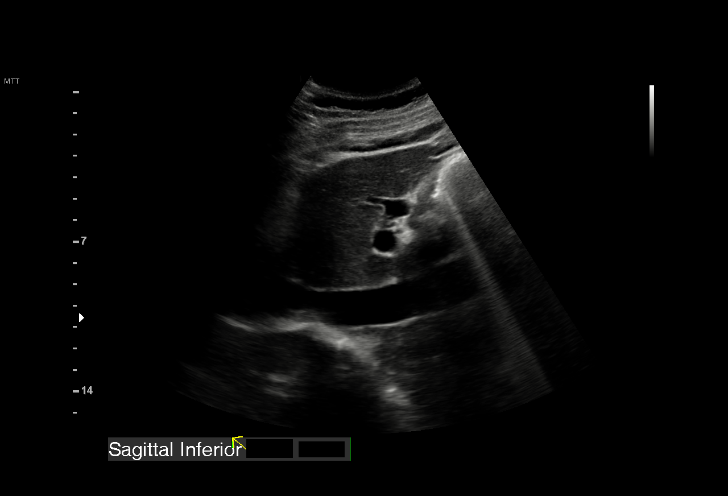
[im 11/125]
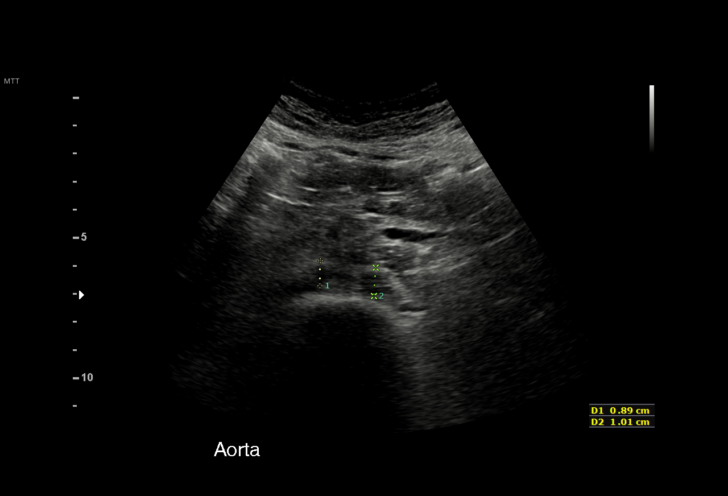
[im 21/125]
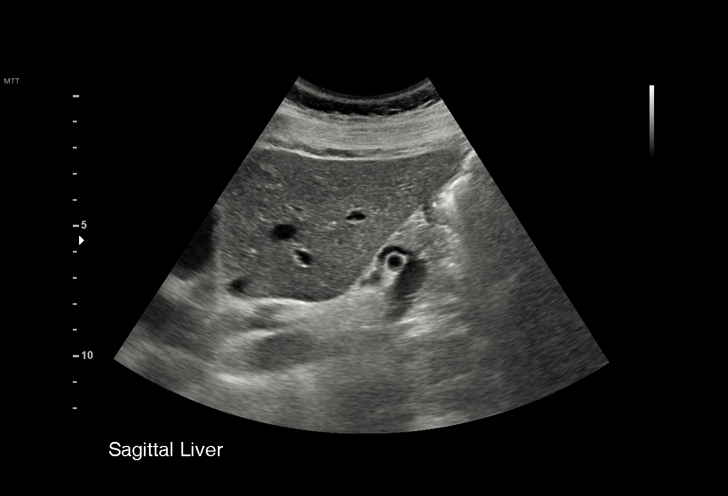
[im 26/125]
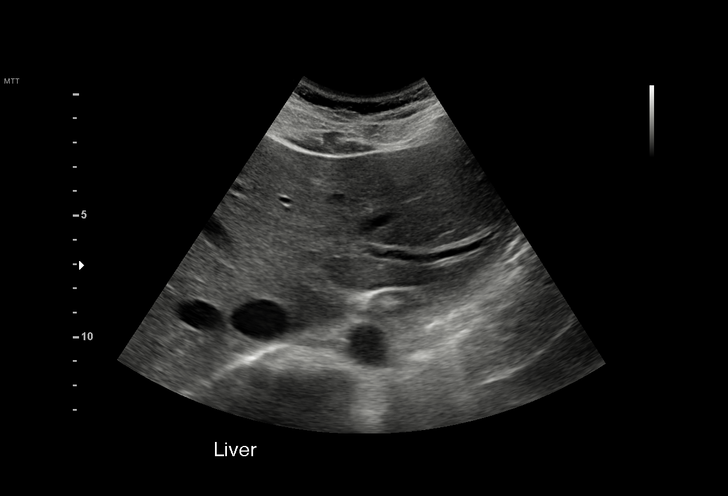
[im 37/125]
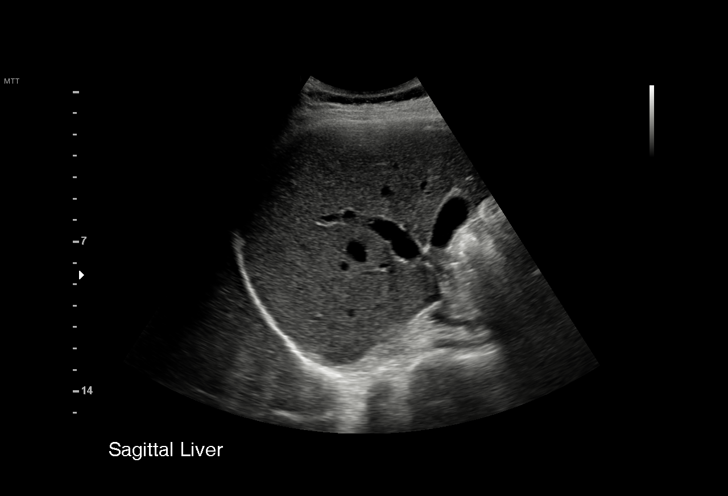
[im 47/125]
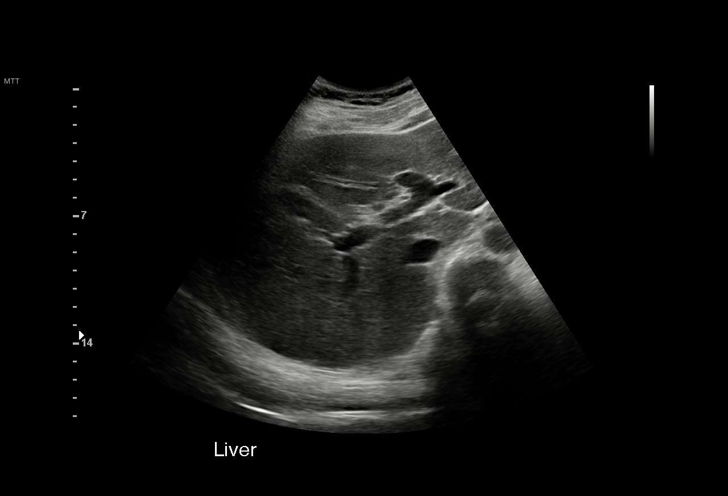
[im 52/125]
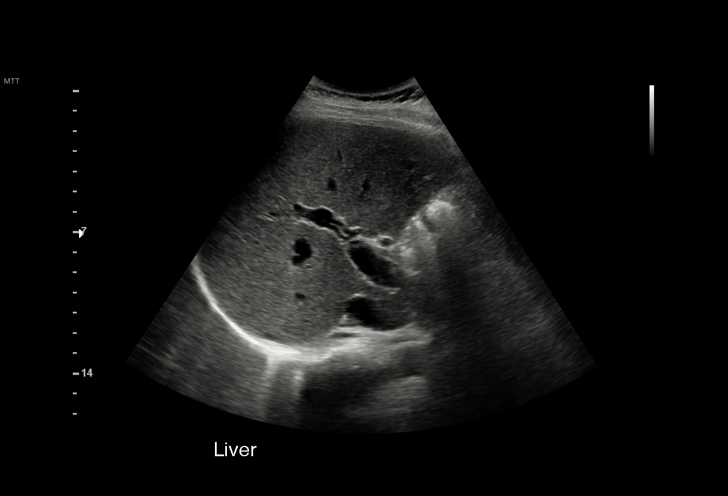
[im 63/125]
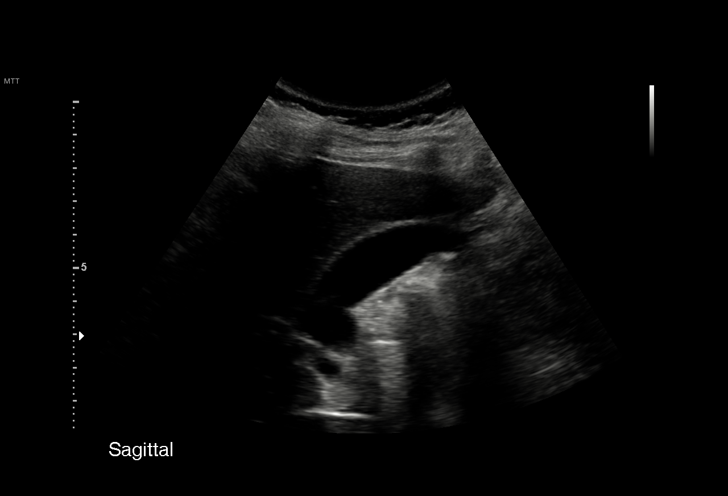
[im 73/125]
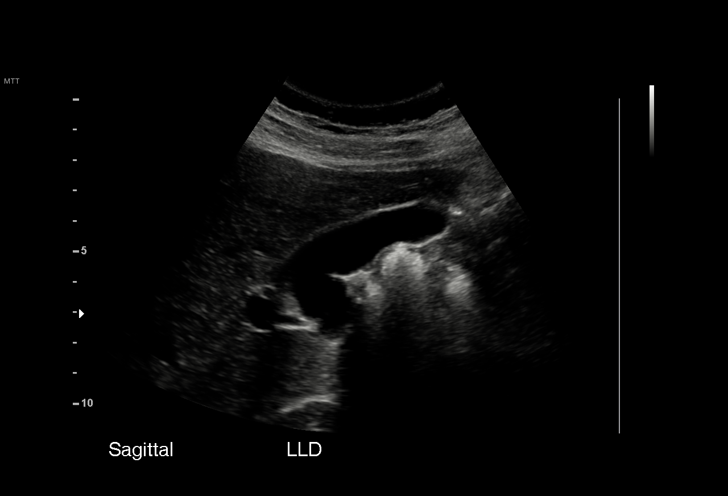
[im 78/125]
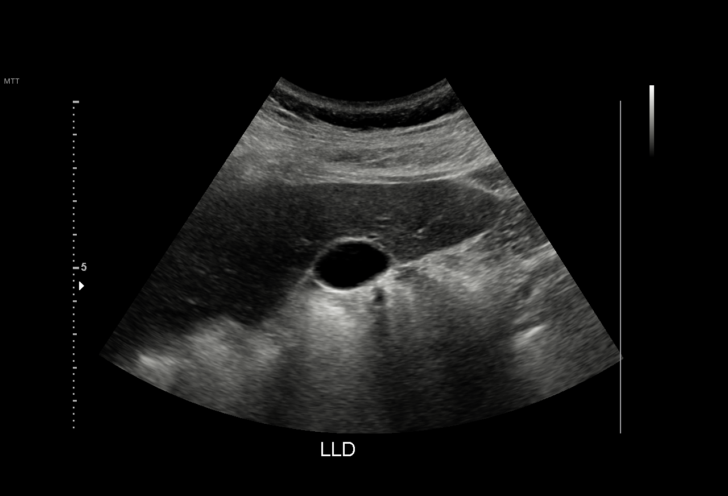
[im 88/125]
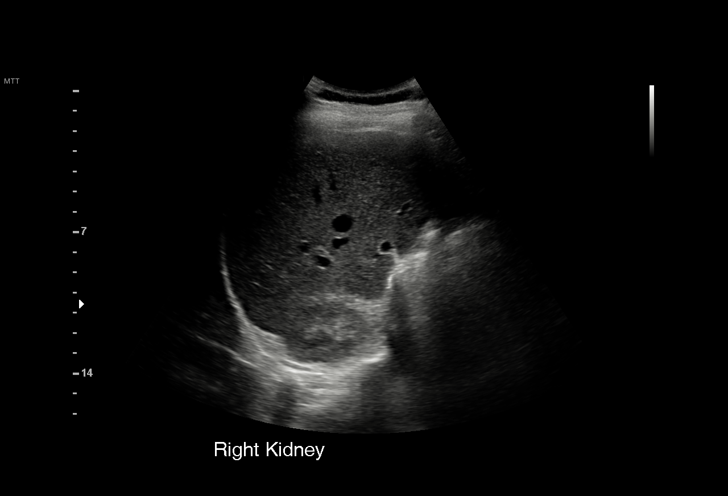
[im 99/125]
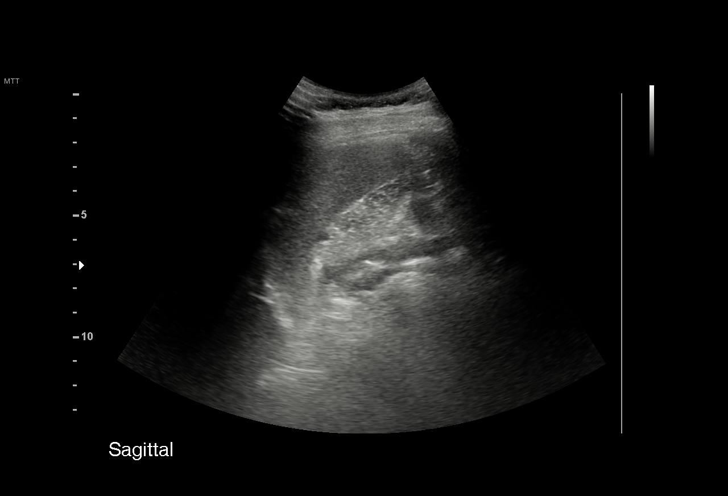
[im 104/125]
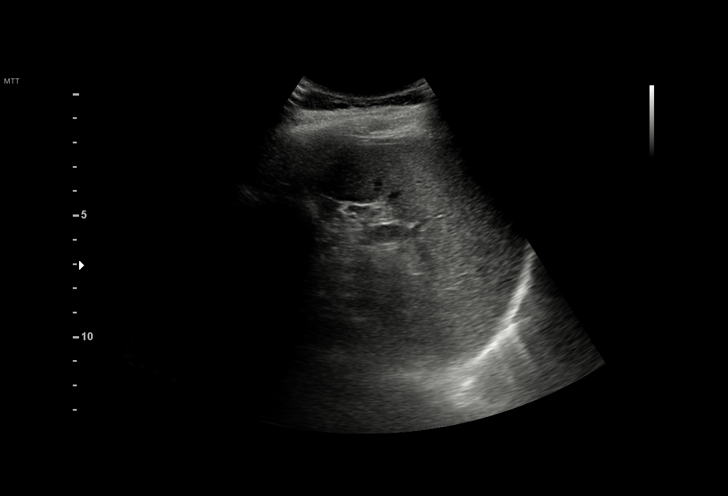
[im 114/125]
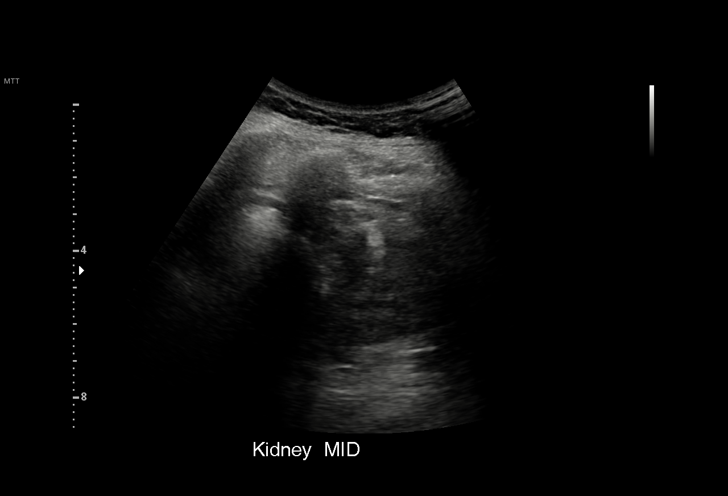
[im 125/125]
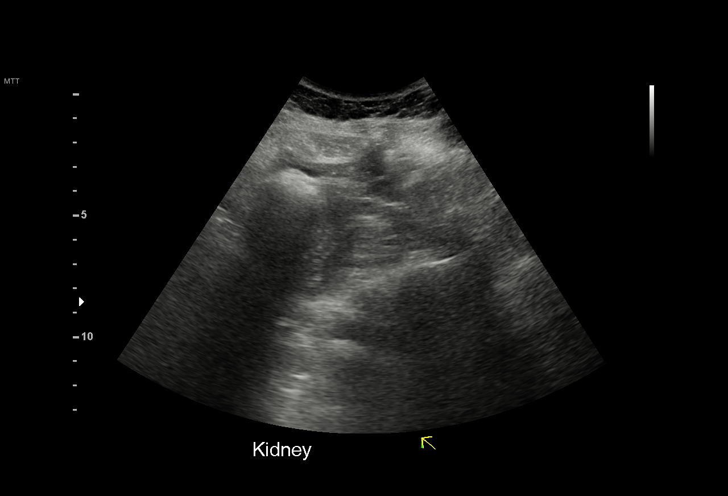

[15 of 25 positions shown; findings below may reference images not displayed]

FINDINGS: Gallbladder: No gallstones or wall thickening visualized (1.4 mm).
No sonographic Murphy sign noted by sonographer.

Common bile duct: Diameter: 3.7 mm

Liver: No focal lesion identified. Within normal limits in
parenchymal echogenicity. Portal vein is patent on color Doppler
imaging with normal direction of blood flow towards the liver.

IVC: No abnormality visualized.

Pancreas: Visualized portion unremarkable.

Spleen: Size and appearance within normal limits.

Right Kidney: Length: 9.8 cm. Echogenicity within normal limits. No
mass or hydronephrosis visualized.

Left Kidney: Length: 9.8 cm. Echogenicity within normal limits. No
hydronephrosis visualized. Small nonshadowing hyperechoic foci are
seen within the mid left kidney. 0.6 cm x 0.6 cm x 0.5 cm and 0.5 cm
x 0.3 cm x 0.3 cm anechoic structures are seen within the left
kidney.

Abdominal aorta: No aneurysm visualized.

Other findings: None.
IMPRESSION: 1. Small left renal cysts.
2. Small nonshadowing echogenic foci within the left kidney which
may represent small nonobstructing renal stones.

## 2019-08-05 IMAGING — US US MFM OB LIMITED
1 series · 15 of 28 positions shown · non-contrast
Comparison: none

[Series 1: us mfm ob limited · 15 of 37 slices shown]
[im 1/37]
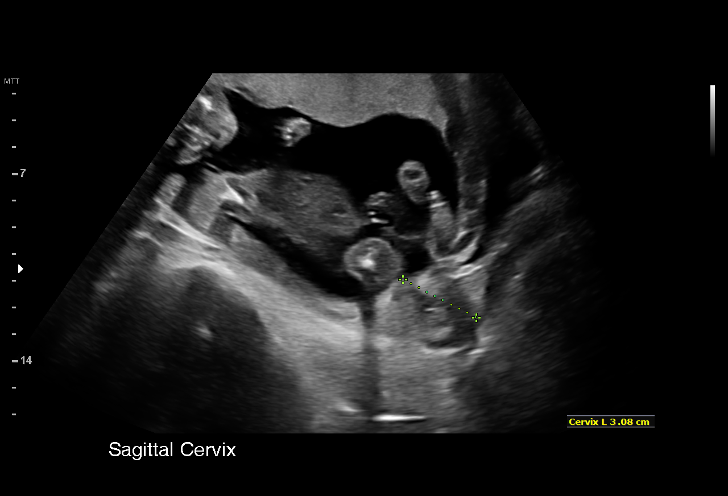
[im 3/37]
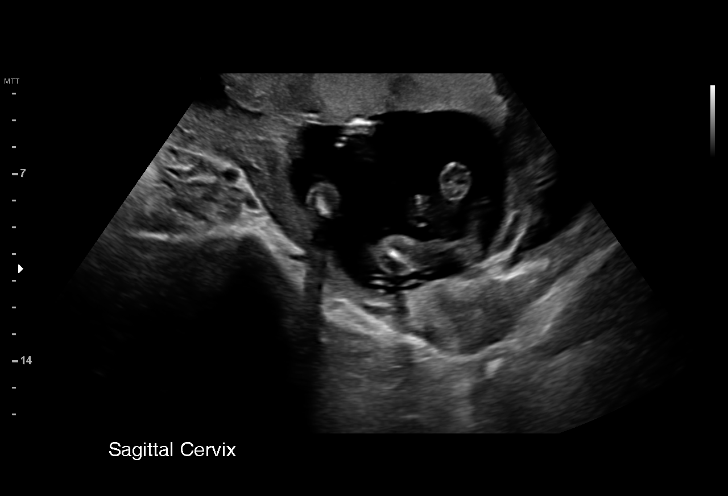
[im 6/37]
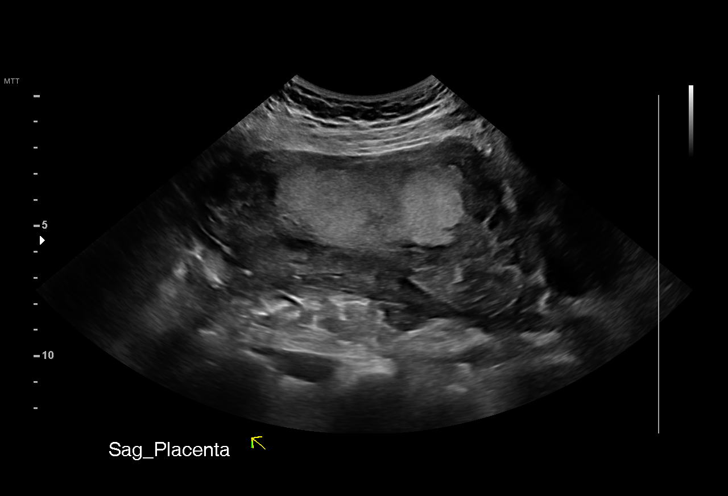
[im 9/37]
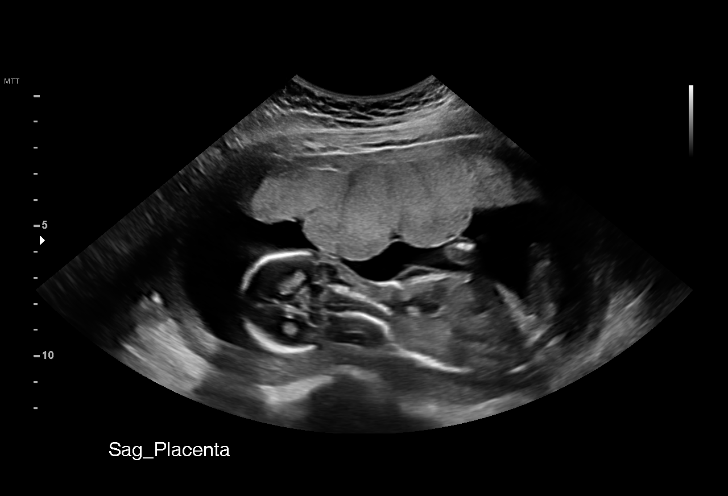
[im 11/37]
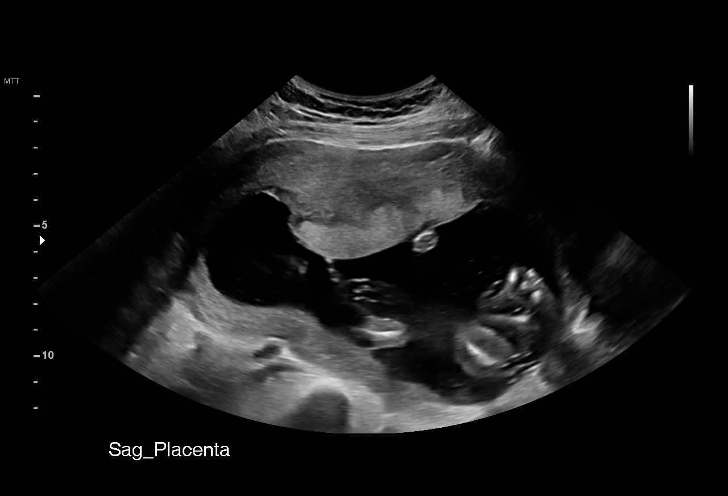
[im 14/37]
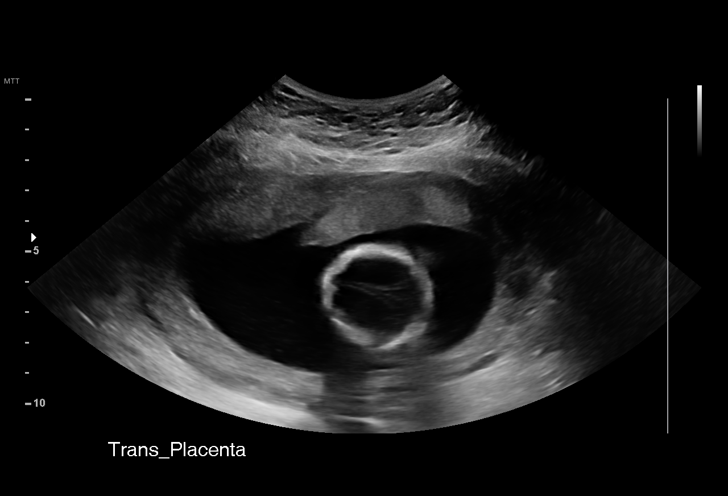
[im 17/37]
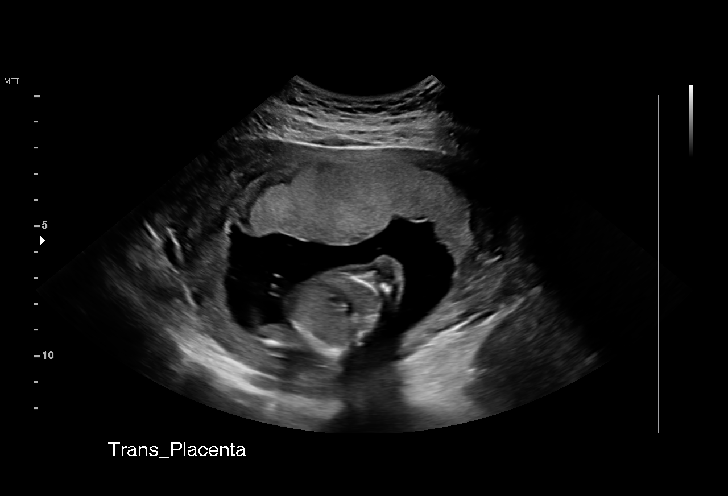
[im 19/37]
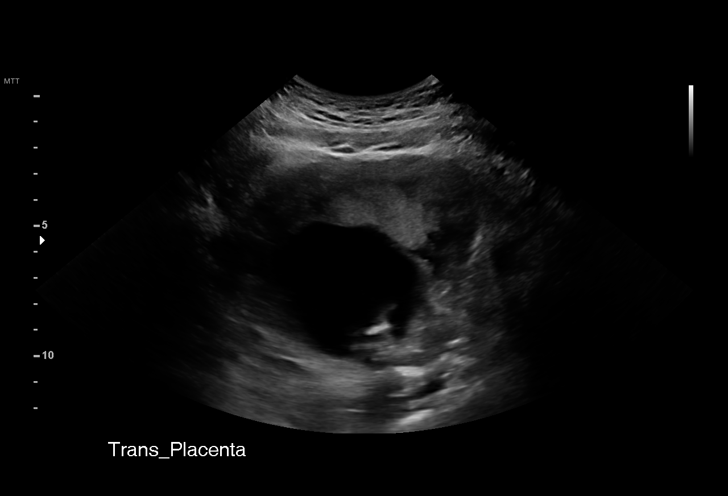
[im 21/37]
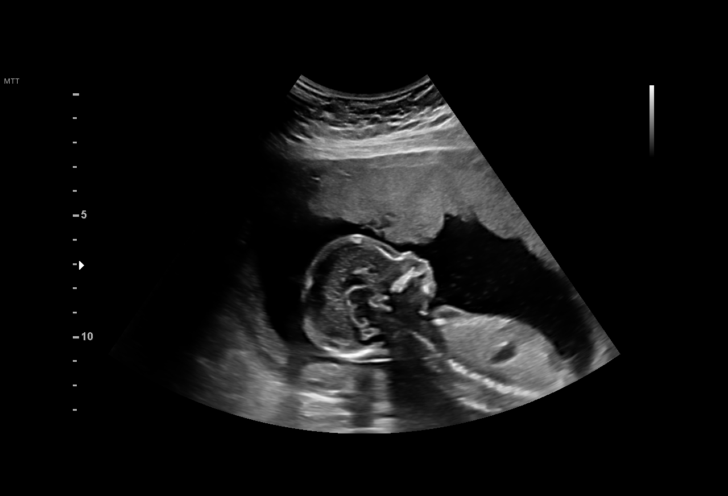
[im 23/37]
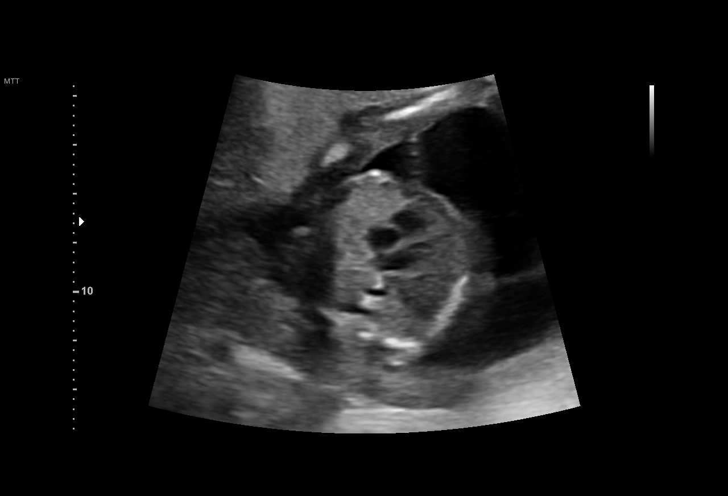
[im 26/37]
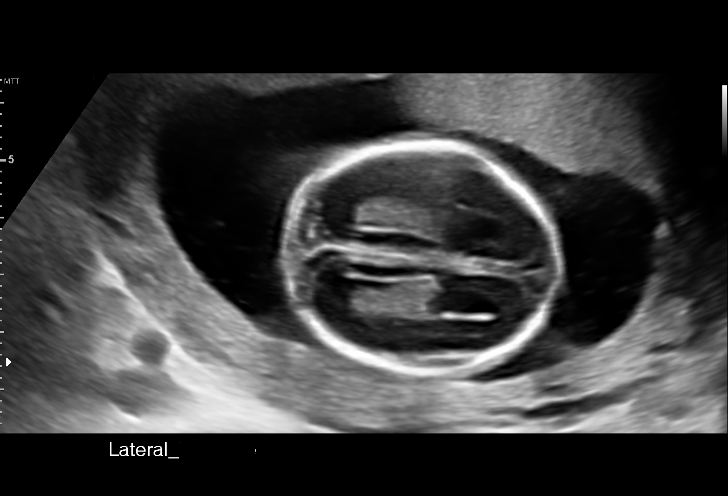
[im 29/37]
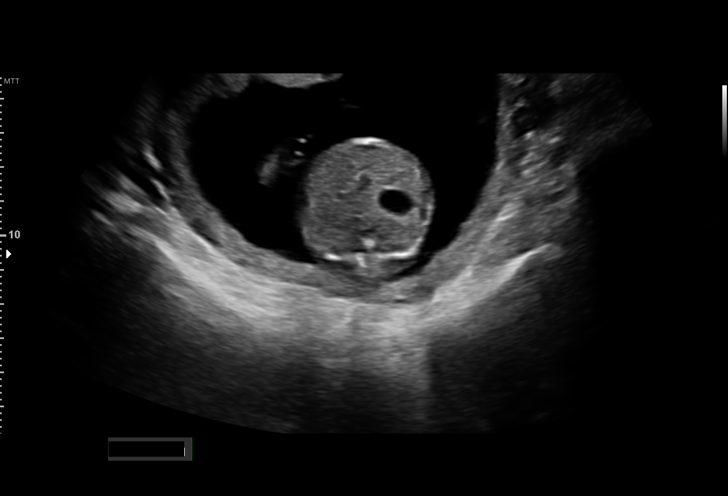
[im 31/37]
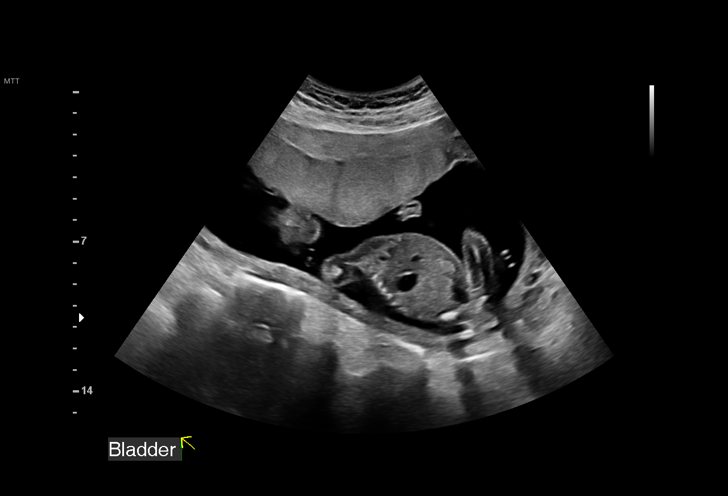
[im 34/37]
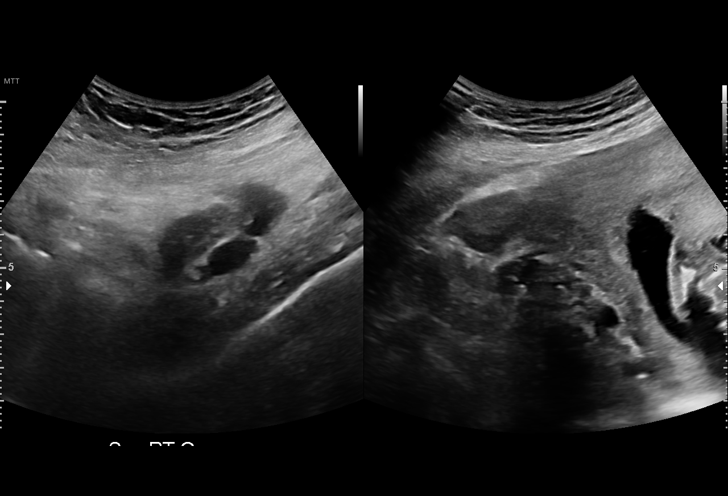
[im 37/37]
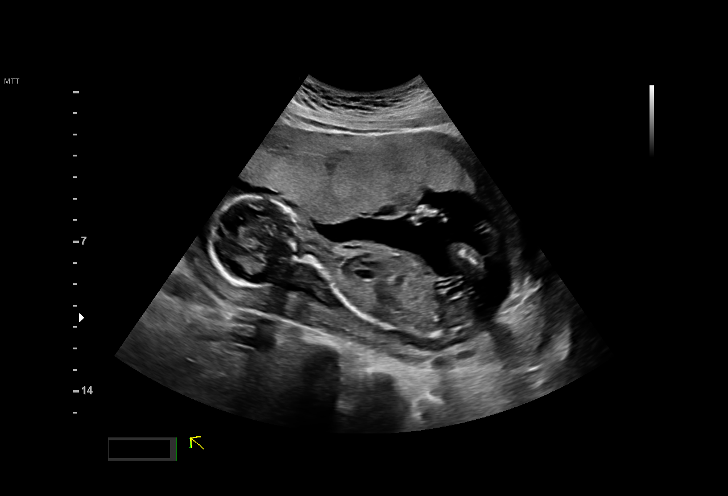

[15 of 28 positions shown; findings below may reference images not displayed]

1  US MFM OB LIMITED                     76815.01    NAZARETH

Indications

 17 weeks gestation of pregnancy
 Abdominal pain in pregnancy                    [S9]
Fetal Evaluation

 Num Of Fetuses:         1
 Fetal Heart Rate(bpm):  157
 Cardiac Activity:       Observed
 Presentation:           Breech
 Placenta:               Anterior
 P. Cord Insertion:      Visualized, central

 Amniotic Fluid
 AFI FV:      Within normal limits

                             Largest Pocket(cm)


 Comment:    No placental abruption or previa identified. No subchorionic
             hemorrhage seen.
OB History

 Maternal Racial/Ethnic Group:   Asian
 Gravidity:    1         Term:   0        Prem:   0        SAB:   0
 TOP:          0       Ectopic:  0        Living: 0
Gestational Age

 LMP:           17w 3d        Date:  [DATE]                 EDD:   [DATE]
 Best:          17w 3d     Det. By:  LMP  ([DATE])          EDD:   [DATE]
Cervix Uterus Adnexa
 Cervix
 Length:           3.08  cm.
 Normal appearance by transabdominal scan.

 Uterus
 No abnormality visualized.

 Right Ovary
 Within normal limits. No adnexal mass visualized.

 Left Ovary
 Within normal limits. No adnexal mass visualized.

 Cul De Sac
 No free fluid seen.

 Adnexa
 No abnormality visualized.
Impression

 Patient was evaluated for abdominal pain .
 A limited ultrasound study was performed .Amniotic fluid is
 normal and good fetal activity is seen .Placenta appears
 normal with no evidence of subchorionic hematoma.
 On transabdominal scan, the cervix looks long and closed .
                 NAZARETH

## 2019-08-05 MED ORDER — CYCLOBENZAPRINE HCL 10 MG PO TABS
10.0000 mg | ORAL_TABLET | Freq: Three times a day (TID) | ORAL | 0 refills | Status: DC | PRN
Start: 2019-08-05 — End: 2019-11-29

## 2019-08-05 MED ORDER — COMFORT FIT MATERNITY SUPP SM MISC: 1.0000 [IU] | Freq: Every day | 0 refills | Status: DC | PRN

## 2019-08-05 MED ORDER — ACETAMINOPHEN 500 MG PO TABS
1000.0000 mg | ORAL_TABLET | Freq: Once | ORAL | Status: AC
  Administered 2019-08-05: 1000 mg via ORAL
  Filled 2019-08-05: qty 2

## 2019-08-05 MED ORDER — CYCLOBENZAPRINE HCL 5 MG PO TABS
10.0000 mg | ORAL_TABLET | Freq: Once | ORAL | Status: AC
  Administered 2019-08-05: 10 mg via ORAL
  Filled 2019-08-05: qty 2

## 2019-08-05 NOTE — Discharge Instructions (Signed)
Safe Medications in Pregnancy    Acne: Benzoyl Peroxide Salicylic Acid  Backache/Headache: Tylenol: 2 regular strength every 4 hours OR              2 Extra strength every 6 hours  Colds/Coughs/Allergies: Benadryl (alcohol free) 25 mg every 6 hours as needed Breath right strips Claritin Cepacol throat lozenges Chloraseptic throat spray Cold-Eeze- up to three times per day Cough drops, alcohol free Flonase (by prescription only) Guaifenesin Mucinex Robitussin DM (plain only, alcohol free) Saline nasal spray/drops Sudafed (pseudoephedrine) & Actifed ** use only after [redacted] weeks gestation and if you do not have high blood pressure Tylenol Vicks Vaporub Zinc lozenges Zyrtec   Constipation: Colace Ducolax suppositories Fleet enema Glycerin suppositories Metamucil Milk of magnesia Miralax Senokot Smooth move tea  Diarrhea: Kaopectate Imodium A-D  *NO pepto Bismol  Hemorrhoids: Anusol Anusol HC Preparation H Tucks  Indigestion: Tums Maalox Mylanta Zantac  Pepcid  Insomnia: Benadryl (alcohol free) 25mg  every 6 hours as needed Tylenol PM Unisom, no Gelcaps  Leg Cramps: Tums MagGel  Nausea/Vomiting:  Bonine Dramamine Emetrol Ginger extract Sea bands Meclizine  Nausea medication to take during pregnancy:  Unisom (doxylamine succinate 25 mg tablets) Take one tablet daily at bedtime. If symptoms are not adequately controlled, the dose can be increased to a maximum recommended dose of two tablets daily (1/2 tablet in the morning, 1/2 tablet mid-afternoon and one at bedtime). Vitamin B6 100mg  tablets. Take one tablet twice a day (up to 200 mg per day).  Skin Rashes: Aveeno products Benadryl cream or 25mg  every 6 hours as needed Calamine Lotion 1% cortisone cream  Yeast infection: Gyne-lotrimin 7 Monistat 7   **If taking multiple medications, please check labels to avoid duplicating the same active ingredients **take  medication as directed on the label ** Do not exceed 4000 mg of tylenol in 24 hours **Do not take medications that contain aspirin or ibuprofen          [facility]     Phone: [phone]  [facility] [place]  Phone: [phone]  [facility] [place]  Phone: [phone]  [facility] [place]                           Phone: [phone]  [facility]          Phone: [phone] - for patients without insurance  [facility]    Phone: [phone]   [facility]    Phone: [phone]  [phone] [facility]    Phone: [phone]  [facility]    Phone: [phone]  [facility]    Phone: [phone]  [facility]  Phone: [phone] - for patients without insurance  [phone] [facility]               Phone: [phone]  [facility]   Phone: [phone]  [facility]    Phone: [phone]                   PREGNANCY SUPPORT BELT: You are not alone, Seventy-five percent of women have some sort of abdominal or back pain at some point in their pregnancy. Your baby is growing at a fast pace, which means that your whole body is rapidly  trying to adjust to the changes. As your uterus grows, your back may start feeling a bit under stress and this can result in back or abdominal pain that can go from mild, and therefore bearable, to severe pains that will not allow you to sit or lay down comfortably, When it comes to dealing with pregnancy-related pains and cramps, some pregnant women usually prefer natural remedies, which the market is filled with nowadays. For example, wearing a pregnancy support belt can help ease and lessen  your discomfort and pain. WHAT ARE THE BENEFITS OF WEARING A PREGNANCY SUPPORT BELT? A pregnancy support belt provides support to the lower portion of the belly taking some of the weight of the growing uterus and distributing to the other parts of your body. It is designed make you comfortable and gives you extra support. Over the years, the pregnancy apparel market has been studying the needs and wants of pregnant women and they have come up with the most comfortable pregnancy support belts that woman could ever ask for. In fact, you will no longer have to wear a stretched-out or bulky pregnancy belt that is visible underneath your clothes and makes you feel even more uncomfortable. Nowadays, a pregnancy support belt is made of comfortable and stretchy materials that will not irritate your skin but will actually make you feel at ease and you will not even notice you are wearing it. They are easy to put on and adjust during the day and can be worn at night for additional support.  BENEFITS: . Relives Back pain . Relieves Abdominal Muscle and Leg Pain . Stabilizes the Pelvic Ring . Offers a Cushioned Abdominal Lift Pad . Relieves pressure on the Sciatic Nerve Within Minutes WHERE TO GET YOUR PREGNANCY BELT: [employer] [phone]  [address] [place], [place]      Round Ligament Pain  The round ligament is a cord of muscle and tissue that helps support the uterus. It can become a source of pain during pregnancy if it becomes stretched or twisted as the baby grows. The pain usually begins in the second trimester (13-28 weeks) of pregnancy, and it can come and go until the baby is delivered. It is not a serious problem, and it does not cause harm to the baby. Round ligament pain is usually a short, sharp, and pinching pain, but it can also be a dull, lingering, and aching pain. The pain is felt in the lower side of the abdomen or in the groin. It usually starts deep in  the groin and moves up to the outside of the hip area. The pain may occur when you:  Suddenly change position, such as quickly going from a sitting to standing position.  Roll over in bed.  Cough or sneeze.  Do physical activity. Follow these instructions at home:   Watch your condition for any changes.  When the pain starts, relax. Then try any of these methods to help with the pain: ? Sitting down. ? Flexing your knees up to your abdomen. ? Lying on your side with one pillow under your abdomen and another pillow between your legs. ? Sitting in a warm bath for 15-20 minutes or until the pain goes away.  Take over-the-counter and prescription medicines only as told by your health care provider.  Move slowly when you sit down or stand up.  Avoid long walks if they cause pain.  Stop or reduce your physical activities if they cause pain.  Keep all follow-up visits as told by your health care provider. This is important. Contact a health care provider if:  Your pain does not go away with treatment.  You feel pain in your back that you did not have before.  Your medicine is not helping. Get help right away if:  You have a fever or chills.  You develop uterine contractions.  You have vaginal bleeding.  You have nausea or vomiting.  You have diarrhea.  You have pain when you urinate. Summary  Round ligament pain is felt in the lower abdomen or groin. It is usually a short, sharp, and pinching pain. It can also be a dull, lingering, and aching pain.  This pain usually begins in the second trimester (13-28 weeks). It occurs because the uterus is stretching with the growing baby, and it is not harmful to the baby.  You may notice the pain when you suddenly change position, when you cough or sneeze, or during physical activity.  Relaxing, flexing your knees to your abdomen, lying on one side, or taking a warm bath may help to get rid of the pain.  Get help from your  health care provider if the pain does not go away or if you have vaginal bleeding, nausea, vomiting, diarrhea, or painful urination. This information is not intended to replace advice given to you by your health care provider. Make sure you discuss any questions you have with your health care provider. Document Revised: 08/20/2017 Document Reviewed: 08/20/2017 Elsevier Patient Education  2020 Elsevier Inc.       Abdominal Pain During Pregnancy  Abdominal pain is common during pregnancy, and has many possible causes. Some causes are more serious than others, and sometimes the cause is not known. Abdominal pain can be a sign that labor is starting. It can also be caused by normal growth and stretching of muscles and ligaments during pregnancy. Always tell your health care provider if you have any abdominal pain. Follow these instructions at home:  Do not have sex or put anything in your vagina until your pain goes away completely.  Get plenty of rest until your pain improves.  Drink enough fluid to keep your urine pale yellow.  Take over-the-counter and prescription medicines only as told by your health care provider.  Keep all follow-up visits as told by your health care provider. This is important. Contact a health care provider if:  Your pain continues or gets worse after resting.  You have lower abdominal pain that: ? Comes and goes at regular intervals. ? Spreads to your back. ? Is similar to menstrual cramps.  You have pain or burning when you urinate. Get help right away if:  You have a fever or chills.  You have vaginal bleeding.  You are leaking fluid from your vagina.  You are passing tissue from your vagina.  You have vomiting or diarrhea that lasts for more than 24 hours.  Your baby is moving less than usual.  You feel very weak or faint.  You have shortness of breath.  You develop severe pain in your upper abdomen. Summary  Abdominal pain is common  during pregnancy, and has many possible causes.  If you experience abdominal pain during pregnancy, tell your health care provider right away.  Follow your health care provider's home care instructions and keep all follow-up visits as directed. This information is not intended to replace advice given to you by your health care provider. Make sure you discuss any questions  you have with your health care provider. Document Revised: 06/22/2018 Document Reviewed: 06/06/2016 Elsevier Patient Education  [place] of Pregnancy The second trimester is from week 14 through week 27 (months 4 through 6). The second trimester is often a time when you feel your best. Your body has adjusted to being pregnant, and you begin to feel better physically. Usually, morning sickness has lessened or quit completely, you may have more energy, and you may have an increase in appetite. The second trimester is also a time when the fetus is growing rapidly. At the end of the sixth month, the fetus is about 9 inches long and weighs about 1 pounds. You will likely begin to feel the baby move (quickening) between 16 and 20 weeks of pregnancy. Body changes during your second trimester Your body continues to go through many changes during your second trimester. The changes vary from woman to woman.  Your weight will continue to increase. You will notice your lower abdomen bulging out.  You may begin to get stretch marks on your hips, abdomen, and breasts.  You may develop headaches that can be relieved by medicines. The medicines should be approved by your health care provider.  You may urinate more often because the fetus is pressing on your bladder.  You may develop or continue to have heartburn as a result of your pregnancy.  You may develop constipation because certain hormones are causing the muscles that push waste through your intestines to slow down.  You may develop  hemorrhoids or swollen, bulging veins (varicose veins).  You may have back pain. This is caused by: ? Weight gain. ? Pregnancy hormones that are relaxing the joints in your pelvis. ? A shift in weight and the muscles that support your balance.  Your breasts will continue to grow and they will continue to become tender.  Your gums may bleed and may be sensitive to brushing and flossing.  Dark spots or blotches (chloasma, mask of pregnancy) may develop on your face. This will likely fade after the baby is born.  A dark line from your belly button to the pubic area (linea nigra) may appear. This will likely fade after the baby is born.  You may have changes in your hair. These can include thickening of your hair, rapid growth, and changes in texture. Some women also have hair loss during or after pregnancy, or hair that feels dry or thin. Your hair will most likely return to normal after your baby is born. What to expect at prenatal visits During a routine prenatal visit:  You will be weighed to make sure you and the fetus are growing normally.  Your blood pressure will be taken.  Your abdomen will be measured to track your baby's growth.  The fetal heartbeat will be listened to.  Any test results from the previous visit will be discussed. Your health care provider may ask you:  How you are feeling.  If you are feeling the baby move.  If you have had any abnormal symptoms, such as leaking fluid, bleeding, severe headaches, or abdominal cramping.  If you are using any tobacco products, including cigarettes, chewing tobacco, and electronic cigarettes.  If you have any questions. Other tests that may be performed during your second trimester include:  Blood tests that check for: ? Low iron levels (anemia). ? High blood sugar that affects pregnant women (gestational diabetes) between 16 and 28 weeks. ?  Rh antibodies. This is to check for a protein on red blood cells (Rh  factor).  Urine tests to check for infections, diabetes, or protein in the urine.  An ultrasound to confirm the proper growth and development of the baby.  An amniocentesis to check for possible genetic problems.  Fetal screens for spina bifida and Down syndrome.  HIV (human immunodeficiency virus) testing. Routine prenatal testing includes screening for HIV, unless you choose not to have this test. Follow these instructions at home: Medicines  Follow your health care provider's instructions regarding medicine use. Specific medicines may be either safe or unsafe to take during pregnancy.  Take a prenatal vitamin that contains at least 600 micrograms (mcg) of folic acid.  If you develop constipation, try taking a stool softener if your health care provider approves. Eating and drinking   Eat a balanced diet that includes fresh fruits and vegetables, whole grains, good sources of protein such as meat, eggs, or tofu, and low-fat dairy. Your health care provider will help you determine the amount of weight gain that is right for you.  Avoid raw meat and uncooked cheese. These carry germs that can cause birth defects in the baby.  If you have low calcium intake from food, talk to your health care provider about whether you should take a daily calcium supplement.  Limit foods that are high in fat and processed sugars, such as fried and sweet foods.  To prevent constipation: ? Drink enough fluid to keep your urine clear or pale yellow. ? Eat foods that are high in fiber, such as fresh fruits and vegetables, whole grains, and beans. Activity  Exercise only as directed by your health care provider. Most women can continue their usual exercise routine during pregnancy. Try to exercise for 30 minutes at least 5 days a week. Stop exercising if you experience uterine contractions.  Avoid heavy lifting, wear low heel shoes, and practice good posture.  A sexual relationship may be continued  unless your health care provider directs you otherwise. Relieving pain and discomfort  Wear a good support bra to prevent discomfort from breast tenderness.  Take warm sitz baths to soothe any pain or discomfort caused by hemorrhoids. Use hemorrhoid cream if your health care provider approves.  Rest with your legs elevated if you have leg cramps or low back pain.  If you develop varicose veins, wear support hose. Elevate your feet for 15 minutes, 3-4 times a day. Limit salt in your diet. Prenatal Care  Write down your questions. Take them to your prenatal visits.  Keep all your prenatal visits as told by your health care provider. This is important. Safety  Wear your seat belt at all times when driving.  Make a list of emergency phone numbers, including numbers for family, friends, the hospital, and police and fire departments. General instructions  Ask your health care provider for a referral to a local prenatal education class. Begin classes no later than the beginning of month 6 of your pregnancy.  Ask for help if you have counseling or nutritional needs during pregnancy. Your health care provider can offer advice or refer you to specialists for help with various needs.  Do not use hot tubs, steam rooms, or saunas.  Do not douche or use tampons or scented sanitary pads.  Do not cross your legs for long periods of time.  Avoid cat litter boxes and soil used by cats. These carry germs that can cause birth defects in the baby  and possibly loss of the fetus by miscarriage or stillbirth.  Avoid all smoking, herbs, alcohol, and unprescribed drugs. Chemicals in these products can affect the formation and growth of the baby.  Do not use any products that contain nicotine or tobacco, such as cigarettes and e-cigarettes. If you need help quitting, ask your health care provider.  Visit your dentist if you have not gone yet during your pregnancy. Use a soft toothbrush to brush your teeth  and be gentle when you floss. Contact a health care provider if:  You have dizziness.  You have mild pelvic cramps, pelvic pressure, or nagging pain in the abdominal area.  You have persistent nausea, vomiting, or diarrhea.  You have a bad smelling vaginal discharge.  You have pain when you urinate. Get help right away if:  You have a fever.  You are leaking fluid from your vagina.  You have spotting or bleeding from your vagina.  You have severe abdominal cramping or pain.  You have rapid weight gain or weight loss.  You have shortness of breath with chest pain.  You notice sudden or extreme swelling of your face, hands, ankles, feet, or legs.  You have not felt your baby move in over an hour.  You have severe headaches that do not go away when you take medicine.  You have vision changes. Summary  The second trimester is from week 14 through week 27 (months 4 through 6). It is also a time when the fetus is growing rapidly.  Your body goes through many changes during pregnancy. The changes vary from woman to woman.  Avoid all smoking, herbs, alcohol, and unprescribed drugs. These chemicals affect the formation and growth your baby.  Do not use any tobacco products, such as cigarettes, chewing tobacco, and e-cigarettes. If you need help quitting, ask your health care provider.  Contact your health care provider if you have any questions. Keep all prenatal visits as told by your health care provider. This is important. This information is not intended to replace advice given to you by your health care provider. Make sure you discuss any questions you have with your health care provider. Document Revised: 06/26/2018 Document Reviewed: 04/09/2016 Elsevier Patient Education  2020 [employer].        Preterm Labor and Birth Information  The normal length of a pregnancy is 39-41 weeks. Preterm labor is when labor starts before 37 completed weeks of pregnancy. What  are the risk factors for preterm labor? Preterm labor is more likely to occur in women who:  Have certain infections during pregnancy such as a bladder infection, sexually transmitted infection, or infection inside the uterus (chorioamnionitis).  Have a shorter-than-normal cervix.  Have gone into preterm labor before.  Have had surgery on their cervix.  Are younger than age 53 or older than age 43.  Are African American.  Are pregnant with twins or multiple babies (multiple gestation).  Take street drugs or smoke while pregnant.  Do not gain enough weight while pregnant.  Became pregnant shortly after having been pregnant. What are the symptoms of preterm labor? Symptoms of preterm labor include:  Cramps similar to those that can happen during a menstrual period. The cramps may happen with diarrhea.  Pain in the abdomen or lower back.  Regular uterine contractions that may feel like tightening of the abdomen.  A feeling of increased pressure in the pelvis.  Increased watery or bloody mucus discharge from the vagina.  Water breaking (ruptured amniotic  sac). Why is it important to recognize signs of preterm labor? It is important to recognize signs of preterm labor because babies who are born prematurely may not be fully developed. This can put them at an increased risk for:  Long-term (chronic) heart and lung problems.  Difficulty immediately after birth with regulating body systems, including blood sugar, body temperature, heart rate, and breathing rate.  Bleeding in the brain.  Cerebral palsy.  Learning difficulties.  Death. These risks are highest for babies who are born before 34 weeks of pregnancy. How is preterm labor treated? Treatment depends on the length of your pregnancy, your condition, and the health of your baby. It may involve:  Having a stitch (suture) placed in your cervix to prevent your cervix from opening too early (cerclage).  Taking or  being given medicines, such as: ? Hormone medicines. These may be given early in pregnancy to help support the pregnancy. ? Medicine to stop contractions. ? Medicines to help mature the baby's lungs. These may be prescribed if the risk of delivery is high. ? Medicines to prevent your baby from developing cerebral palsy. If the labor happens before 34 weeks of pregnancy, you may need to stay in the hospital. What should I do if I think I am in preterm labor? If you think that you are going into preterm labor, call your health care provider right away. How can I prevent preterm labor in future pregnancies? To increase your chance of having a full-term pregnancy:  Do not use any tobacco products, such as cigarettes, chewing tobacco, and e-cigarettes. If you need help quitting, ask your health care provider.  Do not use street drugs or medicines that have not been prescribed to you during your pregnancy.  Talk with your health care provider before taking any herbal supplements, even if you have been taking them regularly.  Make sure you gain a healthy amount of weight during your pregnancy.  Watch for infection. If you think that you might have an infection, get it checked right away.  Make sure to tell your health care provider if you have gone into preterm labor before. This information is not intended to replace advice given to you by your health care provider. Make sure you discuss any questions you have with your health care provider. Document Revised: 06/26/2018 Document Reviewed: 07/26/2015 Elsevier Patient Education  2020 [employer]

## 2019-08-05 NOTE — MAU Note (Signed)
Pt presented to MAU with a Pregnancy Verification Form from the [facility] Department with a positive pregnancy result. The pt's pregnancy test was performed on 06/14/2019. Roney Mans, RN, performed a doppler on the patient with a FHR of 150.

## 2019-08-05 NOTE — ED Provider Notes (Addendum)
MSE was initiated and I personally evaluated the patient and placed orders (if any) at  1:11 PM on Aug 05, 2019.  The patient appears stable so that the remainder of the MSE may be completed by another provider.  1311: I was asked to triage this patient by RN.  34 year old female who is currently pregnant with EDD 01/10/2020, LMP 04/05/2019 presents to the ER for evaluation of worsening abdominal pain.  Has had lower abdominal pain ever since finding out she was pregnant that comes and goes but now pain is constant, more severe and "all over".  She points to her entire abdomen including upper abdomen, umbilicus, lower abdomen.  Reports increased urinary frequency and low back pain since pregnancy.  Denies fever, dysuria, hematuria, abnormal vaginal bleeding.  She has scant white vaginal discharge that is unchanged.  No bowel movement changes.  Vital signs normal.  Hemodynamically stable, afebrile.  Airway intact. Lungs CTAB. Exam reveals diffuse abdominal tenderness with subjective guarding. No CVAT.    Patient considered stable to be transferred to MAU for OB work up.  Discussed with EDP Plunkett.   Spoke to [facility] NP Joni Reining who has accepted patient.    Liberty Handy, PA-C 08/05/19 1322    Gwyneth Sprout, MD 08/06/19 1015

## 2019-08-05 NOTE — MAU Note (Signed)
Pt reports she started having lower ab pain last nigh that is sharp. Pain has gotten worse today. Denies any vag bleeding or discharge.

## 2019-08-05 NOTE — MAU Provider Note (Signed)
History     CSN: [id number]  Arrival date and time: 08/05/19 1258   First Provider Initiated Contact with Patient 08/05/19 1410      Chief Complaint  Patient presents with  . Abdominal Pain   [name] is a 34 y.o. G1P0 at [redacted]w[redacted]d who presents to MAU for abdominal pain.  Onset: last night Location: entire abdomen Duration: <24hours Character: constant, pt unable to describe further Aggravating/Associated: standing up, walking, straightening legs/none Relieving: lying still Treatment: antacid - did not work Severity: 10/10  Pt denies VB, vaginal discharge/odor/itching. Pt denies N/V, constipation, diarrhea, or urinary problems. Pt denies fever, chills, fatigue, sweating or changes in appetite. Pt denies SOB or chest pain. Pt denies dizziness, HA, light-headedness, weakness.  Problems this pregnancy include: pt has not yet been seen. Allergies? NKDA Current medications/supplements? PNVs Prenatal care provider? Pt requests list of OB providers   OB History    Gravida  1   Para      Term      Preterm      AB      Living        SAB      TAB      Ectopic      Multiple      Live Births              History reviewed. No pertinent past medical history.  Past Surgical History:  Procedure Laterality Date  . BREAST SURGERY Right    ?abscess surgery x2    Family History  Problem Relation Age of Onset  . Cancer Maternal Grandmother   . Hypertension Mother     Social History   Tobacco Use  . Smoking status: Never Smoker  . Smokeless tobacco: Never Used  Substance Use Topics  . Alcohol use: No  . Drug use: No    Allergies: No Known Allergies  Medications Prior to Admission  Medication Sig Dispense Refill Last Dose  . Prenatal Vit-Fe Fumarate-FA (MULTIVITAMIN-PRENATAL) 27-0.8 MG TABS tablet Take 1 tablet by mouth daily at 12 noon.     . famotidine (PEPCID) 20 MG tablet Take 1 tablet (20 mg total) by mouth 2 (two) times daily. 60 tablet  2   . meclizine (ANTIVERT) 12.5 MG tablet Take 1 tablet (12.5 mg total) by mouth 3 (three) times daily as needed for dizziness or nausea. 30 tablet 0   . promethazine (PHENERGAN) 12.5 MG tablet Take 1 tablet (12.5 mg total) by mouth every 6 (six) hours as needed for nausea or vomiting. 30 tablet 0     Review of Systems  Constitutional: Negative for chills, diaphoresis, fatigue and fever.  Eyes: Negative for visual disturbance.  Respiratory: Negative for shortness of breath.   Cardiovascular: Negative for chest pain.  Gastrointestinal: Positive for abdominal pain. Negative for constipation, diarrhea, nausea and vomiting.  Genitourinary: Positive for frequency. Negative for dysuria, flank pain, pelvic pain, urgency, vaginal bleeding and vaginal discharge.  Neurological: Negative for dizziness, weakness, light-headedness and headaches.   Physical Exam   Blood pressure (!) 105/58, pulse 65, temperature 98.1 F (36.7 C), temperature source Oral, resp. rate 15, height 5\' 5"  (1.651 m), last menstrual period 04/05/2019, SpO2 98 %.  Patient Vitals for the past 24 hrs:  BP Temp Temp src Pulse Resp SpO2 Height  08/05/19 1557 (!) 105/58 98.1 F (36.7 C) Oral 65 15 98 % --  08/05/19 1353 116/62 98.6 F (37 C) Oral 89 15 99 % --  08/05/19 1331  111/66 -- -- -- 18 -- --  08/05/19 1303 110/71 99 F (37.2 C) Oral 95 16 99 % 5\' 5"  (1.651 m)   Physical Exam  Constitutional: She is oriented to person, place, and time. She appears well-developed and well-nourished. No distress.  HENT:  Head: Normocephalic and atraumatic.  Respiratory: Effort normal.  GI: Soft. She exhibits no distension and no mass. There is no abdominal tenderness. There is no rebound and no guarding.  Genitourinary: There is no rash, tenderness or lesion on the right labia. There is no rash, tenderness or lesion on the left labia.  Neurological: She is alert and oriented to person, place, and time.  Skin: Skin is warm and dry. She  is not diaphoretic.  Psychiatric: She has a normal mood and affect. Her behavior is normal. Judgment and thought content normal.  CE: long/closed/posterior  Results for orders placed or performed during the hospital encounter of 08/05/19 (from the past 24 hour(s))  Urinalysis, Routine w reflex microscopic     Status: None   Collection Time: 08/05/19  1:37 PM  Result Value Ref Range   Color, Urine YELLOW YELLOW   APPearance CLEAR CLEAR   Specific Gravity, Urine 1.009 1.005 - 1.030   pH 6.0 5.0 - 8.0   Glucose, UA NEGATIVE NEGATIVE mg/dL   Hgb urine dipstick NEGATIVE NEGATIVE   Bilirubin Urine NEGATIVE NEGATIVE   Ketones, ur NEGATIVE NEGATIVE mg/dL   Protein, ur NEGATIVE NEGATIVE mg/dL   Nitrite NEGATIVE NEGATIVE   Leukocytes,Ua NEGATIVE NEGATIVE  Wet prep, genital     Status: Abnormal   Collection Time: 08/05/19  3:25 PM   Specimen: Vaginal  Result Value Ref Range   Yeast Wet Prep HPF POC NONE SEEN NONE SEEN   Trich, Wet Prep NONE SEEN NONE SEEN   Clue Cells Wet Prep HPF POC PRESENT (A) NONE SEEN   WBC, Wet Prep HPF POC MANY (A) NONE SEEN   Sperm NONE SEEN    US Abdomen Complete  Result Date: 08/05/2019 CLINICAL DATA:  Abdominal pain. EXAM: ABDOMEN ULTRASOUND COMPLETE COMPARISON:  None. FINDINGS: Gallbladder: No gallstones or wall thickening visualized (1.4 mm). No sonographic Murphy sign noted by sonographer. Common bile duct: Diameter: 3.7 mm Liver: No focal lesion identified. Within normal limits in parenchymal echogenicity. Portal vein is patent on color Doppler imaging with normal direction of blood flow towards the liver. IVC: No abnormality visualized. Pancreas: Visualized portion unremarkable. Spleen: Size and appearance within normal limits. Right Kidney: Length: 9.8 cm. Echogenicity within normal limits. No mass or hydronephrosis visualized. Left Kidney: Length: 9.8 cm. Echogenicity within normal limits. No hydronephrosis visualized. Small nonshadowing hyperechoic foci are  seen within the mid left kidney. 0.6 cm x 0.6 cm x 0.5 cm and 0.5 cm x 0.3 cm x 0.3 cm anechoic structures are seen within the left kidney. Abdominal aorta: No aneurysm visualized. Other findings: None. IMPRESSION: 1. Small left renal cysts. 2. Small nonshadowing echogenic foci within the left kidney which may represent small nonobstructing renal stones. Electronically Signed   By: Virgina Norfolk M.D.   On: 08/05/2019 15:34    MAU Course  Procedures  MDM -suspect RLP, r/o PTL -UA: WNL, sending urine for culture based on symptoms -CE: long/closed/posterior -WetPrep: +ClueCells (isolated finding not requiring treatment) -GC/CT collected -FHT 150 -Abdominal US: 1. Small left renal cysts. 2. Small nonshadowing echogenic foci within the left kidney which may represent small nonobstructing renal stones. -[place]: anterior placenta, normal fluid, no abruption/previa/SCH seen, CL 3.08cm -Flexeril  10mg  and Tylenol 1000mg  given for pain, pt reports pain now 3/10 -pt discharged to home in stable condition  Orders Placed This Encounter  Procedures  . Culture, OB Urine    Standing Status:   Standing    Number of Occurrences:   1  . Wet prep, genital    Standing Status:   Standing    Number of Occurrences:   1  . MFM OB LIMITED    Standing Status:   Standing    Number of Occurrences:   1    Order Specific Question:   Symptom/Reason for Exam    Answer:   Abdominal pain in pregnancy 5/10  . US Abdomen Complete    Standing Status:   Standing    Number of Occurrences:   1    Order Specific Question:   Symptom/Reason for Exam    Answer:   Abdominal pain in pregnancy [[id number]]  . Urinalysis, Routine w reflex microscopic    Standing Status:   Standing    Number of Occurrences:   1  . Ambulatory referral to Nephrology    Referral Priority:   Routine    Referral Type:   Consultation    Referral Reason:   Specialty Services Required    Requested Specialty:   Nephrology    Number of Visits  Requested:   1  . Discharge patient    Order Specific Question:   Discharge disposition    Answer:   01-Home or Self Care [1]    Order Specific Question:   Discharge patient date    Answer:   08/05/2019   Meds ordered this encounter  Medications  . acetaminophen (TYLENOL) tablet 1,000 mg  . cyclobenzaprine (FLEXERIL) tablet 10 mg  . Elastic Bandages & Supports (COMFORT FIT MATERNITY SUPP SM) MISC    Sig: 1 Units by Does not apply route daily as needed.    Dispense:  1 each    Refill:  0    Order Specific Question:   Supervising Provider    Answer:   CONSTANT, PEGGY [[id number]]  . cyclobenzaprine (FLEXERIL) 10 MG tablet    Sig: Take 1 tablet (10 mg total) by mouth 3 (three) times daily as needed.    Dispense:  30 tablet    Refill:  0    Order Specific Question:   Supervising Provider    Answer:   CONSTANT, PEGGY [[id number]]    Assessment and Plan   1. Round ligament pain   2. Abdominal pain in pregnancy   3. [redacted] weeks gestation of pregnancy   4. Multiple renal cysts    Allergies as of 08/05/2019   No Known Allergies     Medication List    TAKE these medications   Comfort Fit Maternity Supp Sm Misc 1 Units by Does not apply route daily as needed.   cyclobenzaprine 10 MG tablet Commonly known as: FLEXERIL Take 1 tablet (10 mg total) by mouth 3 (three) times daily as needed.   famotidine 20 MG tablet Commonly known as: PEPCID Take 1 tablet (20 mg total) by mouth 2 (two) times daily.   meclizine 12.5 MG tablet Commonly known as: ANTIVERT Take 1 tablet (12.5 mg total) by mouth 3 (three) times daily as needed for dizziness or nausea.   multivitamin-prenatal 27-0.8 MG Tabs tablet Take 1 tablet by mouth daily at 12 noon.   promethazine 12.5 MG tablet Commonly known as: PHENERGAN Take 1 tablet (12.5 mg total) by mouth every 6 (six)  hours as needed for nausea or vomiting.      -will call with culture results, if positive -discussed RLP -referral to nephrology for renal  cysts -pt to start NOB care, OB provider list given -safe meds in pregnancy list given -RX Flexeril -return MAU precautions given -pt discharged to home in stable condition  Carol Rangel 08/05/2019, 4:05 PM

## 2019-08-06 LAB — GC/CHLAMYDIA PROBE AMP (~~LOC~~) NOT AT ARMC
Chlamydia: NEGATIVE
Comment: NEGATIVE
Comment: NORMAL
Neisseria Gonorrhea: NEGATIVE

## 2019-08-06 LAB — CULTURE, OB URINE: Culture: NO GROWTH

## 2019-08-24 ENCOUNTER — Ambulatory Visit (INDEPENDENT_AMBULATORY_CARE_PROVIDER_SITE_OTHER): Payer: Medicaid Other | Admitting: *Deleted

## 2019-08-24 ENCOUNTER — Encounter: Payer: Self-pay | Admitting: General Practice

## 2019-08-24 ENCOUNTER — Other Ambulatory Visit: Payer: Self-pay

## 2019-08-24 VITALS — BP 106/70 | HR 77 | Temp 97.9°F | Wt 135.0 lb

## 2019-08-24 DIAGNOSIS — O09519 Supervision of elderly primigravida, unspecified trimester: Secondary | ICD-10-CM | POA: Insufficient documentation

## 2019-08-24 DIAGNOSIS — O0930 Supervision of pregnancy with insufficient antenatal care, unspecified trimester: Secondary | ICD-10-CM | POA: Insufficient documentation

## 2019-08-24 DIAGNOSIS — Z3400 Encounter for supervision of normal first pregnancy, unspecified trimester: Secondary | ICD-10-CM | POA: Insufficient documentation

## 2019-08-24 DIAGNOSIS — Z3A20 20 weeks gestation of pregnancy: Secondary | ICD-10-CM

## 2019-08-24 NOTE — Patient Instructions (Addendum)
 Genetic Screening Results Information: You are having genetic testing called [facility] today.  It will take approximately 2 weeks before the results are available.  To get your results, you need Internet access to a web browser to search [place]/MyChart (the direct app on your phone will not give you these results).  Then select Lab Scanned and click on the blue hyper link that says View Image to see your [facility] results.  You can also use the directions on the purple card given to look up your results directly on the [employer] website.   Second Trimester of Pregnancy  The second trimester is from week 14 through week 27 (month 4 through 6). This is often the time in pregnancy that you feel your best. Often times, morning sickness has lessened or quit. You may have more energy, and you may get hungry more often. Your unborn baby is growing rapidly. At the end of the sixth month, he or she is about 9 inches long and weighs about 1 pounds. You will likely feel the baby move between 18 and 20 weeks of pregnancy. Follow these instructions at home: Medicines  Take over-the-counter and prescription medicines only as told by your doctor. Some medicines are safe and some medicines are not safe during pregnancy.  Take a prenatal vitamin that contains at least 600 micrograms (mcg) of folic acid.  If you have trouble pooping (constipation), take medicine that will make your stool soft (stool softener) if your doctor approves. Eating and drinking   Eat regular, healthy meals.  Avoid raw meat and uncooked cheese.  If you get low calcium from the food you eat, talk to your doctor about taking a daily calcium supplement.  Avoid foods that are high in fat and sugars, such as fried and sweet foods.  If you feel sick to your stomach (nauseous) or throw up (vomit): ? Eat 4 or 5 small meals a day instead of 3 large meals. ? Try eating a few soda crackers. ? Drink liquids between meals instead of during  meals.  To prevent constipation: ? Eat foods that are high in fiber, like fresh fruits and vegetables, whole grains, and beans. ? Drink enough fluids to keep your pee (urine) clear or pale yellow. Activity  Exercise only as told by your doctor. Stop exercising if you start to have cramps.  Do not exercise if it is too hot, too humid, or if you are in a place of great height (high altitude).  Avoid heavy lifting.  Wear low-heeled shoes. Sit and stand up straight.  You can continue to have sex unless your doctor tells you not to. Relieving pain and discomfort  Wear a good support bra if your breasts are tender.  Take warm water baths (sitz baths) to soothe pain or discomfort caused by hemorrhoids. Use hemorrhoid cream if your doctor approves.  Rest with your legs raised if you have leg cramps or low back pain.  If you develop puffy, bulging veins (varicose veins) in your legs: ? Wear support hose or compression stockings as told by your doctor. ? Raise (elevate) your feet for 15 minutes, 3-4 times a day. ? Limit salt in your food. Prenatal care  Write down your questions. Take them to your prenatal visits.  Keep all your prenatal visits as told by your doctor. This is important. Safety  Wear your seat belt when driving.  Make a list of emergency phone numbers, including numbers for family, friends, the hospital, and police and   Garment/textile technologist. General instructions  Ask your doctor about the right foods to eat or for help finding a counselor, if you need these services.  Ask your doctor about local prenatal classes. Begin classes before month 6 of your pregnancy.  Do not use hot tubs, steam rooms, or saunas.  Do not douche or use tampons or scented sanitary pads.  Do not cross your legs for long periods of time.  Visit your dentist if you have not done so. Use a soft toothbrush to brush your teeth. Floss gently.  Avoid all smoking, herbs, and alcohol. Avoid drugs  that are not approved by your doctor.  Do not use any products that contain nicotine or tobacco, such as cigarettes and e-cigarettes. If you need help quitting, ask your doctor.  Avoid cat litter boxes and soil used by cats. These carry germs that can cause birth defects in the baby and can cause a loss of your baby (miscarriage) or stillbirth. Contact a doctor if:  You have mild cramps or pressure in your lower belly.  You have pain when you pee (urinate).  You have bad smelling fluid coming from your vagina.  You continue to feel sick to your stomach (nauseous), throw up (vomit), or have watery poop (diarrhea).  You have a nagging pain in your belly area.  You feel dizzy. Get help right away if:  You have a fever.  You are leaking fluid from your vagina.  You have spotting or bleeding from your vagina.  You have severe belly cramping or pain.  You lose or gain weight rapidly.  You have trouble catching your breath and have chest pain.  You notice sudden or extreme puffiness (swelling) of your face, hands, ankles, feet, or legs.  You have not felt the baby move in over an hour.  You have severe headaches that do not go away when you take medicine.  You have trouble seeing. Summary  The second trimester is from week 14 through week 27 (months 4 through 6). This is often the time in pregnancy that you feel your best.  To take care of yourself and your unborn baby, you will need to eat healthy meals, take medicines only if your doctor tells you to do so, and do activities that are safe for you and your baby.  Call your doctor if you get sick or if you notice anything unusual about your pregnancy. Also, call your doctor if you need help with the right food to eat, or if you want to know what activities are safe for you. This information is not intended to replace advice given to you by your health care provider. Make sure you discuss any questions you have with your health  care provider. Document Revised: 06/26/2018 Document Reviewed: 04/09/2016 Elsevier Patient Education  2020 [employer].  Alpha-Fetoprotein Test Why am I having this test? The alpha-fetoprotein test is most commonly used in pregnant women to help screen for birth defects in their unborn baby. It can be used to screen for birth defects, such as chromosome (DNA) abnormalities, problems with the brain or spinal cord, or problems with the abdominal wall of the unborn baby (fetus). The alpha-fetoprotein test may also be done for men or non-pregnant women to check for certain cancers. What is being tested? This test measures the amount of alpha-fetoprotein (AFP) in your blood. AFP is a protein that is made by the liver. Levels can be detected in the mother's blood during pregnancy, starting at 10  weeks and peaking at 16-18 weeks of the pregnancy. Abnormal levels can sometimes be a sign of a birth defect in the baby. Certain cancers can cause a high level of AFP in men and non-pregnant women. What kind of sample is taken?  A blood sample is required for this test. It is usually collected by inserting a needle into a blood vessel. How are the results reported? Your test results will be reported as values. Your health care provider will compare your results to normal ranges that were established after testing a large group of people (reference values). Reference values may vary among labs and hospitals. For this test, common reference values are:  Adult: Less than 40 ng/mL or less than 40 mcg/L (SI units).  Child younger than 1 year: Less than 30 ng/mL. If you are pregnant, the values may also vary based on how long you have been pregnant. What do the results mean? Results that are above the reference values in pregnant women may indicate the following for the baby:  Neural tube defects, such as abnormalities of the spinal cord or brain.  Abdominal wall defects.  Multiple pregnancy such as  twins.  Fetal distress or fetal death. Results that are above the reference values in men or non-pregnant women may indicate:  Reproductive cancers, such as ovarian or testicular cancer.  Liver cancer.  Liver cell death.  Other types of cancer. Very low levels of AFP in pregnant women may indicate the following for the baby:  Down syndrome.  Fetal death. Talk with your health care provider about what your results mean. Questions to ask your health care provider Ask your health care provider, or the department that is doing the test:  When will my results be ready?  How will I get my results?  What are my treatment options?  What other tests do I need?  What are my next steps? Summary  The alpha-fetoprotein test is done on pregnant women to help screen for birth defects in their unborn baby.  Certain cancers can cause a high level of AFP in men and non-pregnant women.  For this test, a blood sample is usually collected by inserting a needle into a blood vessel.  Talk with your health care provider about what your results mean. This information is not intended to replace advice given to you by your health care provider. Make sure you discuss any questions you have with your health care provider. Document Revised: 02/14/2017 Document Reviewed: 10/08/2016 Elsevier Patient Education  [facility] During Pregnancy Genetic testing during pregnancy is also called prenatal genetic testing. This type of testing can determine if your baby is at risk of being born with a disorder caused by abnormal genes or chromosomes (genetic disorder). Chromosomes contain genes that control how your baby will develop in your womb. There are many different genetic disorders. Examples of genetic disorders that may be found through genetic testing include Down syndrome and cystic fibrosis. Gene changes (mutations) can be passed down through families. Genetic testing is offered  to all women before or during pregnancy. You can choose whether to have genetic testing. Why is genetic testing done? Genetic testing is done during pregnancy to find out whether your child is at risk for a genetic disorder. Having genetic testing allows you to:  Discuss your test results and options with a genetic counselor.  Prepare for a baby that may be born with a genetic disorder. Learning about the disorder ahead of  time helps you be better prepared to manage it. Your health care providers can also be prepared in case your baby requires special care before or after birth.  Consider whether you want to continue with the pregnancy. In some cases, genetic testing may be done to learn about the traits a child will inherit. Types of genetic tests There are two basic types of genetic testing. Screening tests indicate whether your developing baby (fetus) is at higher risk for a genetic disorder. Diagnostic tests check actual fetal cells to diagnose a genetic disorder. Screening tests     Screening tests will not harm your baby. They are recommended for all pregnant women. Types of screening tests include:  Carrier screening. This test involves checking genes from both parents by testing their blood or saliva. The test checks to find out if the parents carry a genetic mutation that may be passed to a baby. In most cases, both parents must carry the mutation for a baby to be at risk.  First trimester screening. This test combines a blood test with sound wave imaging of your baby (fetal ultrasound). This screening test checks for a risk of Down syndrome or other defects caused by having extra chromosomes. It also checks for defects of the heart, abdomen, or skeleton.  Second trimester screening also combines a blood test with a fetal ultrasound exam. It checks for a risk of genetic defects of the face, brain, spine, heart, or limbs.  Combined or sequential screening. This type of testing  combines the results of first and second trimester screening. This type of testing may be more accurate than first or second trimester screening alone.  Cell-free DNA testing. This is a blood test that detects cells released by the placenta that get into the mother's blood. It can be used to check for a risk of Down syndrome, other extra chromosome syndromes, and disorders caused by abnormal numbers of sex chromosomes. This test can be done any time after 10 weeks of pregnancy.  Diagnostic tests Diagnostic tests carry slight risks of problems, including bleeding, infection, and loss of the pregnancy. These tests are done only if your baby is at risk for a genetic disorder. You may meet with a genetic counselor to discuss the risks and benefits before having diagnostic tests. Examples of diagnostic tests include:  Chorionic villus sampling (CVS). This involves a procedure to remove and test a sample of cells taken from the placenta. The procedure may be done between 10 and 12 weeks of pregnancy.  Amniocentesis. This involves a procedure to remove and test a sample of fluid (amniotic fluid) and cells from the sac that surrounds the developing baby. The procedure may be done between 15 and 20 weeks of pregnancy. What do the results mean? For a screening test:  If the results are negative, it often means that your child is not at higher risk. There is still a slight chance your child could have a genetic disorder.  If the results are positive, it does not mean your child will have a genetic disorder. It may mean that your child has a higher-than-normal risk for a genetic disorder. In that case, you may want to talk with a genetic counselor about whether you should have diagnostic genetic tests. For a diagnostic test:  If the result is negative, it is unlikely that your child will have a genetic disorder.  If the test is positive for a genetic disorder, it is likely that your child will have the  disorder. The test may not tell how severe the disorder will be. Talk with your health care provider about your options. Questions to ask your health care provider Before talking to your health care provider about genetic testing, find out if there is a history of genetic disorders in your family. It may also help to know your family's ethnic origins. Then ask your health care provider the following questions:  Is my baby at risk for a genetic disorder?  What are the benefits of having genetic screening?  What tests are best for me and my baby?  What are the risks of each test?  If I get a positive result on a screening test, what is the next step?  Should I meet with a genetic counselor before having a diagnostic test?  Should my partner or other members of my family be tested?  How much do the tests cost? Will my insurance cover the testing? Summary  Genetic testing is done during pregnancy to find out whether your child is at risk for a genetic disorder.  Genetic testing is offered to all women before or during pregnancy. You can choose whether to have genetic testing.  There are two basic types of genetic testing. Screening tests indicate whether your developing baby (fetus) is at higher risk for a genetic disorder. Diagnostic tests check actual fetal cells to diagnose a genetic disorder.  If a diagnostic genetic test is positive, talk with your health care provider about your options. This information is not intended to replace advice given to you by your health care provider. Make sure you discuss any questions you have with your health care provider. Document Revised: 06/25/2018 Document Reviewed: 05/19/2017 Elsevier Patient Education  2020 [employer]  Warning Signs During Pregnancy A pregnancy lasts about 40 weeks, starting from the first day of your last period until the baby is born. Pregnancy is divided into three phases called trimesters.  The first trimester refers  to week 1 through week 13 of pregnancy.  The second trimester is the start of week 14 through the end of week 27.  The third trimester is the start of week 28 until you deliver your baby. During each trimester of pregnancy, certain signs and symptoms may indicate a problem. Talk with your health care provider about your current health and any medical conditions you have. Make sure you know the symptoms that you should watch for and report. How does this affect me?  Warning signs in the first trimester While some changes during the first trimester may be uncomfortable, most do not represent a serious problem. Let your health care provider know if you have any of the following warning signs in the first trimester:  You cannot eat or drink without vomiting, and this lasts for longer than a day.  You have vaginal bleeding or spotting along with menstrual-like cramping.  You have diarrhea for longer than a day.  You have a fever or other signs of infection, such as: ? Pain or burning when you urinate. ? Foul smelling or thick or yellowish vaginal discharge. Warning signs in the second trimester As your baby grows and changes during the second trimester, there are additional signs and symptoms that may indicate a problem. These include:  Signs and symptoms of infection, including a fever.  Signs or symptoms of a miscarriage or preterm labor, such as regular contractions, menstrual-like cramping, or lower abdominal pain.  Bloody or watery vaginal discharge or obvious vaginal bleeding.  Feeling like  your heart is pounding.  Having trouble breathing.  Nausea, vomiting, or diarrhea that lasts for longer than a day.  Craving non-food items, such as clay, chalk, or dirt. This may be a sign of a very treatable medical condition called pica. Later in your second trimester, watch for signs and symptoms of a serious medical condition called preeclampsia.These include:  Changes in your  vision.  A severe headache that does not go away.  Nausea and vomiting. It is also important to notice if your baby stops moving or moves less than usual during this time. Warning signs in the third trimester As you approach the third trimester, your baby is growing and your body is preparing for the birth of your baby. In your third trimester, be sure to let your health care provider know if:  You have signs and symptoms of infection, including a fever.  You have vaginal bleeding.  You notice that your baby is moving less than usual or is not moving.  You have nausea, vomiting, or diarrhea that lasts for longer than a day.  You have a severe headache that does not go away.  You have vision changes, including seeing spots or having blurry or double vision.  You have increased swelling in your hands or face. How does this affect my baby? Throughout your pregnancy, always report any of the warning signs of a problem to your health care provider. This can help prevent complications that may affect your baby, including:  Increased risk for premature birth.  Infection that may be transmitted to your baby.  Increased risk for stillbirth. Contact a health care provider if:  You have any of the warning signs of a problem for the current trimester of your pregnancy.  Any of the following apply to you during any trimester of pregnancy: ? You have strong emotions, such as sadness or anxiety, that interfere with work or personal relationships. ? You feel unsafe in your home and need help finding a safe place to live. ? You are using tobacco products, alcohol, or drugs and you need help to stop. Get help right away if: You have signs or symptoms of labor before 37 weeks of pregnancy. These include:  Contractions that are 5 minutes or less apart, or that increase in frequency, intensity, or length.  Sudden, sharp abdominal pain or low back pain.  Uncontrolled gush or trickle of fluid  from your vagina. Summary  A pregnancy lasts about 40 weeks, starting from the first day of your last period until the baby is born. Pregnancy is divided into three phases called trimesters. Each trimester has warning signs to watch for.  Always report any warning signs to your health care provider in order to prevent complications that may affect both you and your baby.  Talk with your health care provider about your current health and any medical conditions you have. Make sure you know the symptoms that you should watch for and report. This information is not intended to replace advice given to you by your health care provider. Make sure you discuss any questions you have with your health care provider. Document Revised: 06/23/2018 Document Reviewed: 12/19/2016 Elsevier Patient Education  2020 [employer].

## 2019-08-24 NOTE — Progress Notes (Addendum)
° °  PRENATAL INTAKE SUMMARY  Ms. Bloomquist presents today New OB Nurse Interview.  OB History    Gravida  1   Para      Term      Preterm      AB      Living        SAB      TAB      Ectopic      Multiple      Live Births             I have reviewed the patient's medical, obstetrical, social, and family histories, medications, and available lab results.  SUBJECTIVE She has no unusual complaints. Left renal cyst per abdominal ultrasound 08/05/19  OBJECTIVE Initial nurse interview for history and labs (New OB).  LATE TO CARE  EDD: 01/10/2020 by [facility] GA: [redacted]w[redacted]d G1P0 FHT: 150  GENERAL APPEARANCE: alert, well appearing, in no apparent distress, oriented to person, place and time.   ASSESSMENT Normal pregnancy  PLAN Prenatal care-[facility] P[facility]/HIV  OB Urine Culture AFP GC/CT/PAP at next visit with Raelyn Mora, CNM 09/09/2019 [facility] ([facility]) [facility] Ultrasound MFM +14 wk detailed for anatomy scan/late to care/advance maternal age Continue PNV  [place]is Pu, RN

## 2019-08-25 LAB — CBC/D/PLT+RPR+RH+ABO+RUB AB...
Antibody Screen: NEGATIVE
Basophils Absolute: 0 10*3/uL (ref 0.0–0.2)
Basos: 0 %
EOS (ABSOLUTE): 0.2 10*3/uL (ref 0.0–0.4)
Eos: 2 %
HCV Ab: <0.1 s/co ratio (ref 0.0–0.9)
HIV Screen 4th Generation wRfx: NONREACTIVE
Hematocrit: 35.7 % (ref 34.0–46.6)
Hemoglobin: 12.2 g/dL (ref 11.1–15.9)
Hepatitis B Surface Ag: NEGATIVE
Immature Grans (Abs): 0.1 10*3/uL (ref 0.0–0.1)
Immature Granulocytes: 1 %
Lymphocytes Absolute: 1.5 10*3/uL (ref 0.7–3.1)
Lymphs: 15 %
MCH: 32.3 pg (ref 26.6–33.0)
MCHC: 34.2 g/dL (ref 31.5–35.7)
MCV: 94 fL (ref 79–97)
Monocytes Absolute: 0.5 10*3/uL (ref 0.1–0.9)
Monocytes: 5 %
Neutrophils Absolute: 7.8 10*3/uL — ABNORMAL HIGH (ref 1.4–7.0)
Neutrophils: 77 %
Platelets: 198 10*3/uL (ref 150–450)
RBC: 3.78 x10E6/uL (ref 3.77–5.28)
RDW: 12 % (ref 11.7–15.4)
RPR Ser Ql: NONREACTIVE
Rh Factor: POSITIVE
Rubella Antibodies, IGG: 3.92 index (ref >0.99)
WBC: 10.1 10*3/uL (ref 3.4–10.8)

## 2019-08-25 LAB — HCV INTERPRETATION

## 2019-08-26 ENCOUNTER — Other Ambulatory Visit: Payer: Self-pay | Admitting: *Deleted

## 2019-08-26 ENCOUNTER — Ambulatory Visit (INDEPENDENT_AMBULATORY_CARE_PROVIDER_SITE_OTHER): Payer: Medicaid Other | Admitting: Obstetrics and Gynecology

## 2019-08-26 ENCOUNTER — Ambulatory Visit: Payer: Medicaid Other | Admitting: *Deleted

## 2019-08-26 ENCOUNTER — Other Ambulatory Visit: Payer: Self-pay

## 2019-08-26 ENCOUNTER — Other Ambulatory Visit (HOSPITAL_COMMUNITY)
Admission: RE | Admit: 2019-08-26 | Discharge: 2019-08-26 | Disposition: A | Discharge status: Home / Self Care | Payer: Medicaid Other | Source: Ambulatory Visit | Attending: Obstetrics and Gynecology | Admitting: Obstetrics and Gynecology

## 2019-08-26 ENCOUNTER — Ambulatory Visit (INDEPENDENT_AMBULATORY_CARE_PROVIDER_SITE_OTHER): Payer: Medicaid Other | Admitting: Licensed Clinical Social Worker

## 2019-08-26 ENCOUNTER — Encounter: Payer: Self-pay | Admitting: Obstetrics and Gynecology

## 2019-08-26 ENCOUNTER — Ambulatory Visit: Payer: Medicaid Other | Attending: Obstetrics and Gynecology

## 2019-08-26 ENCOUNTER — Encounter: Payer: Self-pay | Admitting: General Practice

## 2019-08-26 VITALS — BP 99/62 | HR 91 | Temp 98.3°F | Wt 136.6 lb

## 2019-08-26 DIAGNOSIS — Z3A20 20 weeks gestation of pregnancy: Secondary | ICD-10-CM

## 2019-08-26 DIAGNOSIS — O0930 Supervision of pregnancy with insufficient antenatal care, unspecified trimester: Secondary | ICD-10-CM | POA: Diagnosis present

## 2019-08-26 DIAGNOSIS — M5431 Sciatica, right side: Secondary | ICD-10-CM

## 2019-08-26 DIAGNOSIS — F4321 Adjustment disorder with depressed mood: Secondary | ICD-10-CM | POA: Diagnosis not present

## 2019-08-26 DIAGNOSIS — O35EXX0 Maternal care for other (suspected) fetal abnormality and damage, fetal genitourinary anomalies, not applicable or unspecified: Secondary | ICD-10-CM

## 2019-08-26 DIAGNOSIS — O09512 Supervision of elderly primigravida, second trimester: Secondary | ICD-10-CM

## 2019-08-26 DIAGNOSIS — Z3400 Encounter for supervision of normal first pregnancy, unspecified trimester: Secondary | ICD-10-CM

## 2019-08-26 DIAGNOSIS — O0932 Supervision of pregnancy with insufficient antenatal care, second trimester: Secondary | ICD-10-CM

## 2019-08-26 DIAGNOSIS — Z363 Encounter for antenatal screening for malformations: Secondary | ICD-10-CM

## 2019-08-26 DIAGNOSIS — Z124 Encounter for screening for malignant neoplasm of cervix: Secondary | ICD-10-CM | POA: Insufficient documentation

## 2019-08-26 DIAGNOSIS — O26892 Other specified pregnancy related conditions, second trimester: Secondary | ICD-10-CM

## 2019-08-26 DIAGNOSIS — O09519 Supervision of elderly primigravida, unspecified trimester: Secondary | ICD-10-CM | POA: Diagnosis present

## 2019-08-26 DIAGNOSIS — M79604 Pain in right leg: Secondary | ICD-10-CM

## 2019-08-26 DIAGNOSIS — Z789 Other specified health status: Secondary | ICD-10-CM

## 2019-08-26 LAB — URINE CULTURE, OB REFLEX

## 2019-08-26 LAB — AFP, SERUM, OPEN SPINA BIFIDA
AFP MoM: 1.43
AFP Value: 89.1 ng/mL
Gest. Age on Collection Date: 20 weeks
Maternal Age At EDD: 36.3 yr
OSBR Risk 1 IN: 3311
Test Results:: NEGATIVE
Weight: 135 [lb_av]

## 2019-08-26 LAB — CULTURE, OB URINE

## 2019-08-26 IMAGING — US US MFM OB DETAIL+14 WK
1 series · 13 of 28 positions shown · non-contrast
Comparison: none

[Series 1: us mfm ob detail+14 wk · 80 acquisitions, 13 frames shown]
[im 3/80]
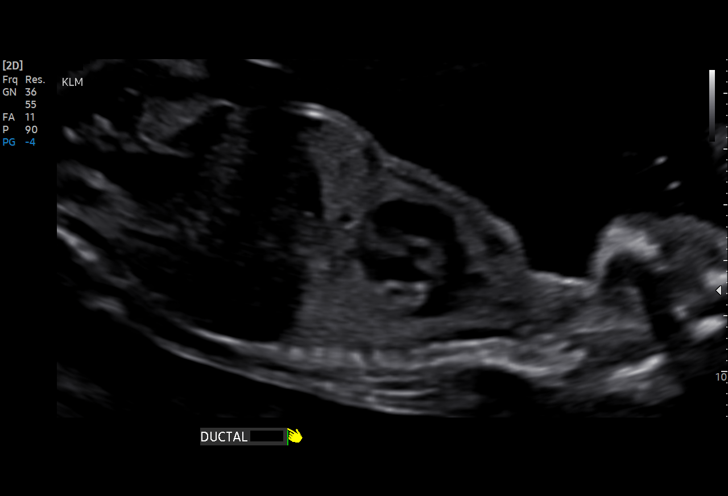
[im 9/80]
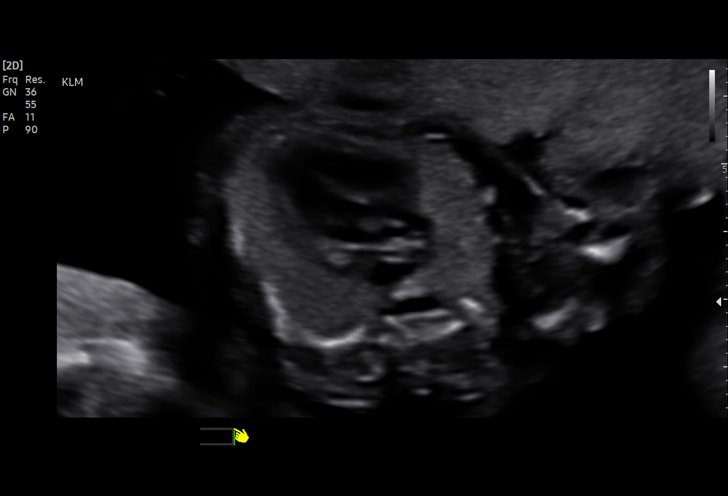
[im 15/80]
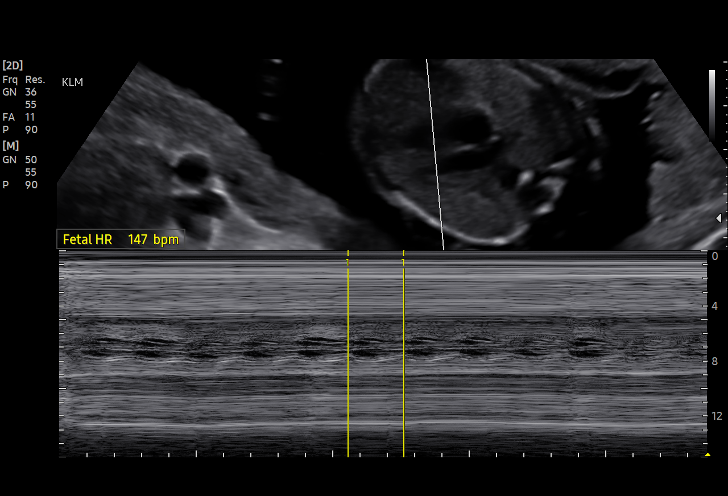
[im 21/80]
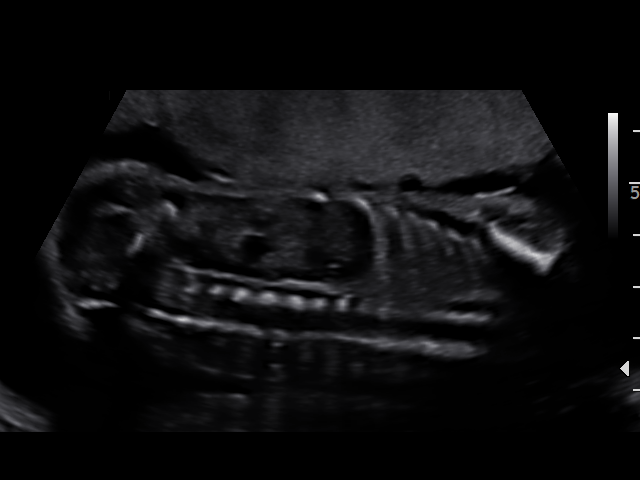
[im 27/80]
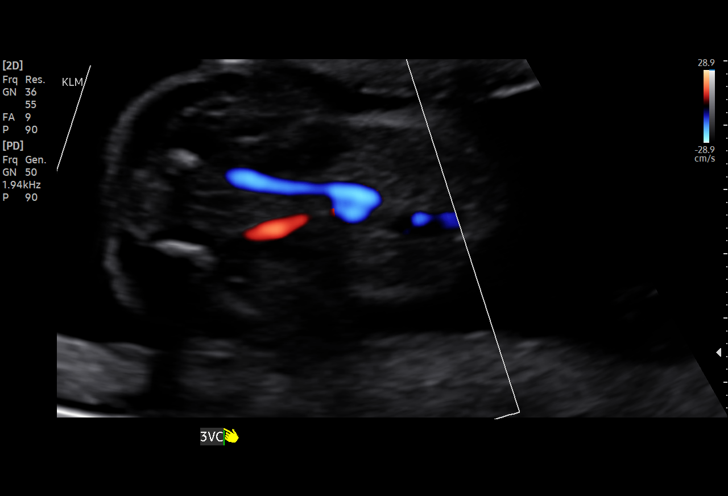
[im 33/80]
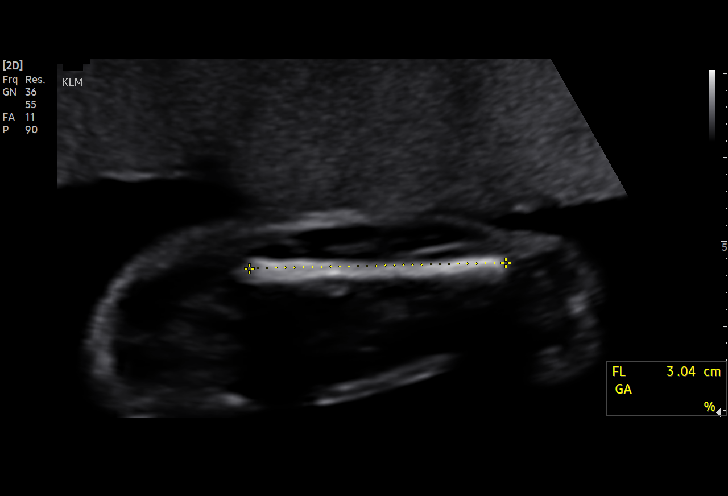
[im 41/80]
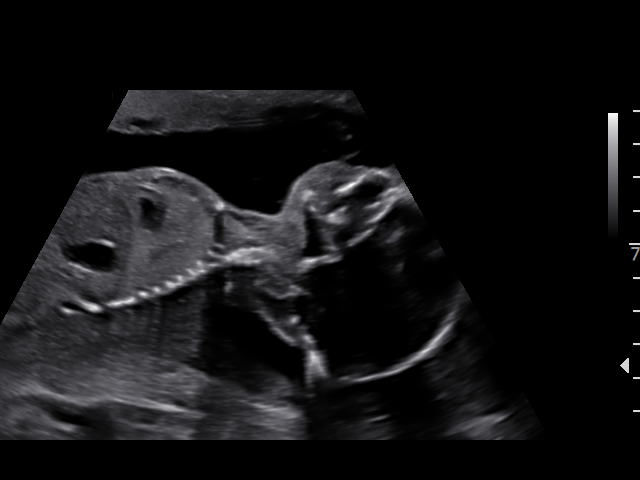
[im 47/80]
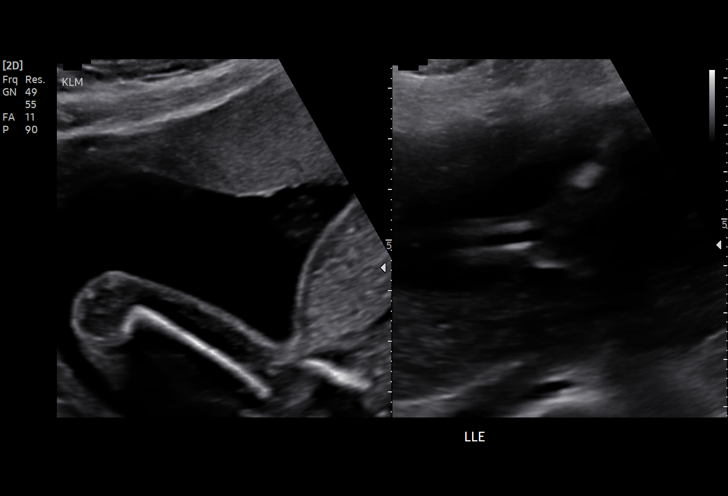
[im 53/80]
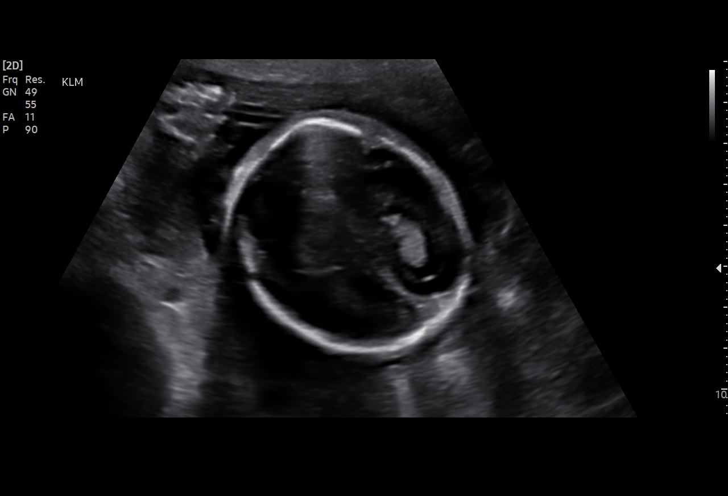
[im 59/80]
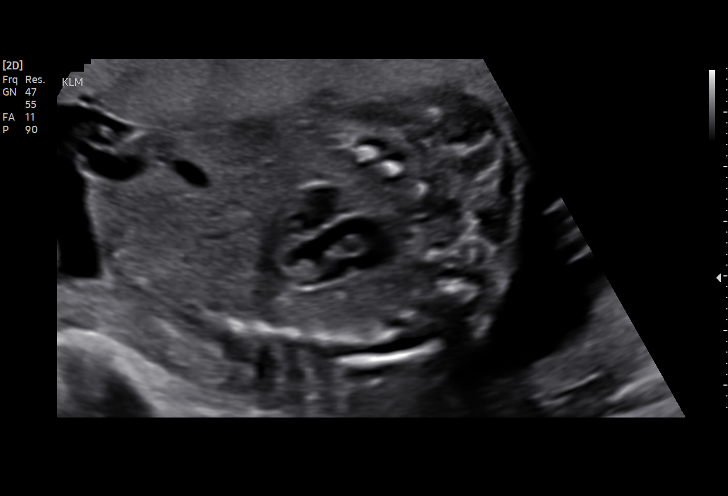
[im 65/80]
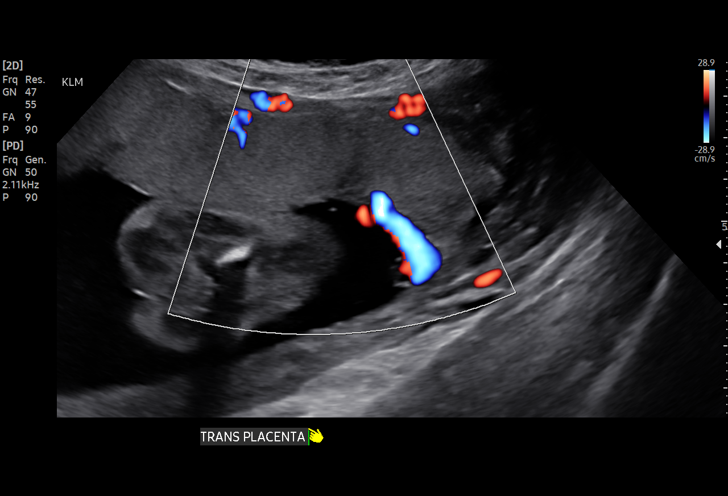
[im 71/80]
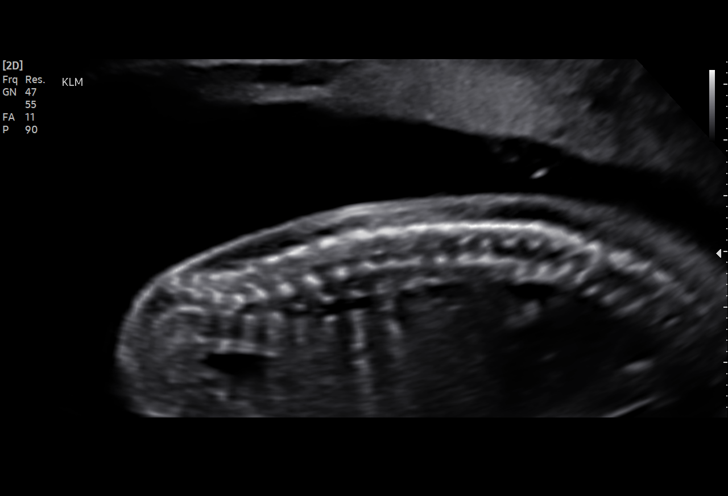
[im 77/80]
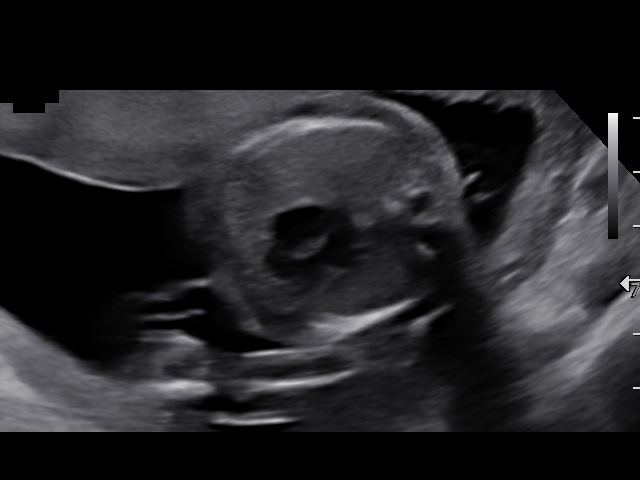

[13 of 28 positions shown; findings below may reference images not displayed]

Indications

 Encounter for antenatal screening for           [QA]
 malformations
 20 weeks gestation of pregnancy
 Advanced maternal age primigravida 35+,         [QA]
 second trimester
 Late prenatal care, second trimester            [QA]
Fetal Evaluation

 Num Of Fetuses:          1
 Fetal Heart Rate(bpm):   147
 Cardiac Activity:        Observed
 Presentation:            Cephalic
 Placenta:                Anterior
 P. Cord Insertion:       Visualized

 Amniotic Fluid
 AFI FV:      Within normal limits
Biometry

 BPD:      50.3   mm     G. Age:  21w 2d         80  %    CI:         74.13  %    70 - 86
                                                          FL/HC:       16.5  %    16.8 -
 HC:      185.5   mm     G. Age:  20w 6d         63  %    HC/AC:       1.16       1.09 -
 AC:        160   mm     G. Age:  21w 1d         67  %    FL/BPD:      61.0  %
 FL:       30.7   mm     G. Age:  19w 4d         14  %    FL/AC:       19.2  %    20 - 24
 HUM:      32.6   mm     G. Age:  21w 0d         65  %

 Est. FW:     355   gm    0 lb 13 oz      47  %
OB History
 Maternal Racial/Ethnic Group:    Asian
 Gravidity:     1         Term:  0          Prem:  0        SAB:   0
 TOP:           0       Ectopic: 0         Living: 0
Gestational Age

 LMP:            20w 3d       Date:  [DATE]                   EDD:  [DATE]
 U/S Today:      20w 5d                                         EDD:  [DATE]
 Best:           20w 3d    Det. By:  LMP  ([DATE])            EDD:  [DATE]
Anatomy

 Cranium:                Appears normal         Aortic Arch:            Appears normal
 Cavum:                  Appears normal         Ductal Arch:            Appears normal
 Ventricles:             Appears normal         Diaphragm:              Appears normal
 Choroid Plexus:         Appears normal         Stomach:                Appears normal, left
                                                                        sided
 Cerebellum:             Appears normal         Abdomen:                Appears normal
 Posterior Fossa:        Appears normal         Abdominal Wall:         Appears nml (cord
                                                                        insert, abd wall)
 Nuchal Fold:            Not applicable (>20    Cord Vessels:           Appears normal (3
                         wks GA)                                        vessel cord)
 Face:                   Appears normal         Kidneys:                Bilat pyelectasis,
                         (orbits and profile)                           Rt 5mm, Lt 6mm
 Lips:                   Appears normal         Bladder:                Appears normal
 Thoracic:               Appears normal         Spine:                  Appears normal
 Heart:                  Appears normal         Upper Extremities:      Appears normal
                         (4CH, axis, and situs)
 RVOT:                   Appears normal         Lower Extremities:      Appears normal
 LVOT:                   Appears normal

 Other:   Heels visualized. Nasal bone visualized. Technically difficult due to fetal
          position.
Cervix Uterus Adnexa

 Cervix
 Length:             3.5  cm.
 Normal appearance by transabdominal scan.
Impression

 Single intrauterine pregnancy with measurements consistent
 with dates
 Normal anatomy seen today.
 In addition we observed bilateral renal pyelectasis. There were
 no additional markers of aneuploidy observed. I explained
 todays findings suggestive of a normal variant or ureteropelvic,
 or ureterovesicle junction obstruction and possible aneuploidy.
 Ms. NORDIIN has a cell free DNA test pending. I explained that at 32
 week the threshold for abnormal findings extends from 4mm to
 7 mm. If the current measurements remain the same this will be
 normal finding. If the AP measurement increases we will
 recommend postnatal evaluation.
Recommendations

 Follow up scheduled between 28-32 weeks.

## 2019-08-26 NOTE — Progress Notes (Signed)
INITIAL OBSTETRICAL VISIT Patient name: Carol Rangel MRN [id number]  Date of birth: [birth year] Chief Complaint:   Initial Prenatal Visit  History of Present Illness:   Carol Halfilan Grimsley is a 34 y.o. G1P0 Asian female at 3264w3d by LMP with an Estimated Date of Delivery: 01/10/20 being seen today for her initial obstetrical visit.  Her obstetrical history is significant for Late to Care. This is a planned pregnancy. She and the father of the baby (FOB) live together. She has a support system that consists of the FOB/her mother/father/friends. Today she reports she is concerned that her baby has Down Syndrome, because she thinks the MFM MD told her that the bilateral renal pylectasis means the baby has Down Symdrome. She also complains of RT leg pain (that was there before pregnancy), pain makes it hard for her to stand/walk.   Patient's last menstrual period was 04/05/2019 (within days). Last pap unknown. Results were: unknown Review of Systems:   Pertinent items are noted in HPI Denies cramping/contractions, leakage of fluid, vaginal bleeding, abnormal vaginal discharge w/ itching/odor/irritation, headaches, visual changes, shortness of breath, chest pain, abdominal pain, severe nausea/vomiting, or problems with urination or bowel movements unless otherwise stated above.  Pertinent History Reviewed:  Reviewed past medical,surgical, social, obstetrical and family history.  Reviewed problem list, medications and allergies. OB History  Gravida Para Term Preterm AB Living  1            SAB TAB Ectopic Multiple Live Births               # Outcome Date GA Lbr Len/2nd Weight Sex Delivery Anes PTL Lv  1 Current            Physical Assessment:   Vitals:   08/26/19 1452  BP: 99/62  Pulse: 91  Temp: 98.3 F (36.8 C)  Weight: 136 lb 9.6 oz (62 kg)  Body mass index is 22.73 kg/m.       Physical Examination:  General appearance - well appearing, and in no distress  Mental status - alert, oriented  to person, place, and time  Psych:  She has a normal mood and affect  Skin - warm and dry, normal color, no suspicious lesions noted  Chest - effort normal, all lung fields clear to auscultation bilaterally  Heart - normal rate and regular rhythm  Abdomen - soft, nontender  Extremities:  No swelling or varicosities noted  Pelvic - VULVA: normal appearing vulva with no masses, tenderness or lesions  VAGINA: normal appearing vagina with normal color and discharge, no lesions.   CERVIX: normal appearing cervix without discharge or lesions, no CMT  Thin prep pap is done with HR HPV cotesting  FHTs by doppler: 142 bpm  Narrative & Impression  ----------------------------------------------------------------------  OBSTETRICS REPORT                         (Signed Final 08/26/2019 02:03 pm) ---------------------------------------------------------------------- Patient Info  ID #:        [id number]                          D.O.B.:  [birth year] (33 yrs)  Name:        Carol HalfNILAN Wiechman                      Visit Date: 08/26/2019 11:49 am ---------------------------------------------------------------------- Performed By  Attending:  Corenthian Booker      Referred By:       [facility] MAU/Triage                     MD  Performed By:      Marcellina Millin RDMS     [facility] ---------------------------------------------------------------------- Orders  #   Description                          Code         Ordered By  1   [place] MFM OB DETAIL +14 WK              76811.01     Kimberley Dastrup ----------------------------------------------------------------------  #   Order #                    Accession #                 Episode #  1   [id number]                  [id number]                  [id number] ---------------------------------------------------------------------- Indications  Encounter for antenatal screening  for           Z36.3  malformations  [redacted] weeks gestation of pregnancy                 Z3A.34  Advanced maternal age primigravida 50+,         O58.512  second trimester  Late prenatal care, second trimester            O09.32 ---------------------------------------------------------------------- Fetal Evaluation  Num Of Fetuses:          1  Fetal Heart Rate(bpm):   147  Cardiac Activity:        Observed  Presentation:            Cephalic  Placenta:                Anterior  P. Cord Insertion:       Visualized  Amniotic Fluid  AFI FV:      Within normal limits ---------------------------------------------------------------------- Biometry  BPD:      50.3   mm     G. Age:  21w 2d         80  %    CI:         74.13  %    70 - 86                                                           FL/HC:       16.5  %    16.8 - 19.8  HC:      185.5  mm     G. Age:  20w 6d         63  %    HC/AC:       1.16       1.09 - 1.39  AC:        160   mm     G. Age:  21w 1d         67  %    FL/BPD:      61.0  %  FL:       30.7   mm     G. Age:  19w 4d         14  %    FL/AC:       19.2  %    20 - 24  HUM:      32.6   mm     G. Age:  21w 0d         65  %  Est. FW:     355   gm    0 lb 13 oz      47  % ---------------------------------------------------------------------- OB History  Maternal Racial/Ethnic Group:    Asian  Gravidity:     1         Term:  0          Prem:  0        SAB:   0  TOP:           0       Ectopic: 0         Living: 0 ---------------------------------------------------------------------- Gestational Age  LMP:            20w 3d       Date:  04/05/19                   EDD:  01/10/20  U/S Today:      20w 5d                                         EDD:  01/08/20  Best:           Cherylann Parr 3d    Det. By:  LMP  (04/05/19)            EDD:  01/10/20 ---------------------------------------------------------------------- Anatomy  Cranium:                Appears normal         Aortic Arch:             Appears normal  Cavum:                  Appears normal         Ductal Arch:            Appears normal  Ventricles:             Appears normal         Diaphragm:              Appears normal  Choroid Plexus:         Appears normal         Stomach:                Appears normal, left  sided  Cerebellum:             Appears normal         Abdomen:                Appears normal  Posterior Fossa:        Appears normal         Abdominal Wall:         Appears nml (cord                                                                         insert, abd wall)  Nuchal Fold:            Not applicable (>98    Cord Vessels:           Appears normal ([redacted]                          wks GA)                                        vessel cord)  Face:                   Appears normal         Kidneys:                Bilat pyelectasis,                          (orbits and profile)                           Rt 35mm, Lt 42mm  Lips:                   Appears normal         Bladder:                Appears normal  Thoracic:               Appears normal         Spine:                  Appears normal  Heart:                  Appears normal         Upper Extremities:      Appears normal                          (4CH, axis, and situs)  RVOT:                   Appears normal         Lower Extremities:      Appears normal  LVOT:                   Appears normal  Other:   Heels visualized. Nasal bone visualized. Technically difficult due to fetal  position. ---------------------------------------------------------------------- Cervix Uterus Adnexa  Cervix  Length:             3.5  cm.  Normal appearance by transabdominal scan. ---------------------------------------------------------------------- Impression  Single intrauterine pregnancy with measurements consistent  with dates  Normal anatomy seen today.  In addition we observed bilateral renal  pyelectasis. There were  no additional markers of aneuploidy observed. I explained  todays findings suggestive of a normal variant or ureteropelvic,  or ureterovesicle junction obstruction and possible aneuploidy.  Ms. Lober has a cell free DNA test pending. I explained that at 32  week the threshold for abnormal findings extends from 42mm to  7 mm. If the current measurements remain the same this will be  normal finding. If the AP measurement increases we will  recommend postnatal evaluation. ---------------------------------------------------------------------- Recommendations  Follow up scheduled between 28-32 weeks. ----------------------------------------------------------------------               Lin Landsman, MD Electronically Signed Final Report   08/26/2019 02:03 pm ----------------------------------------------------------------------    Assessment & Plan:  1) Low-Risk Pregnancy G1P0 at [redacted]w[redacted]d with an Estimated Date of Delivery: 01/10/20   2) Initial OB visit - Welcomed to practice and introduced self to patient in addition to discussing other advanced practice providers that she may be seeing at this practice - Congratulated patient - Anticipatory guidance on upcoming appointments - Educated on COVID19 and pregnancy and the integration of virtual appointments  - Educated on babyscripts app- patient reports she has not received email, encouraged to look in spam folder and to call office if she still has not received email - patient verbalizes understanding  3) Supervision of normal first pregnancy, antepartum  - Cervicovaginal ancillary only( [place])  4) Late prenatal care  5) Advanced maternal age, primigravida, antepartum  6) Right sciatic nerve pain  - Plan: Ambulatory referral to Neurology  7) Right leg pain  - Plan: Ambulatory referral to Neurology  8) Language barrier affecting health care  - Speaks and understands (a little) English    Meds: No  orders of the defined types were placed in this encounter.   Initial labs obtained Continue prenatal vitamins Reviewed n/v relief measures and warning s/s to report Reviewed recommended weight gain based on pre-gravid BMI Encouraged well-balanced diet Genetic Screening discussed: ordered Cystic fibrosis, SMA, Fragile X screening discussed ordered The nature of [facility] with multiple MDs and other Advanced Practice Providers was explained to patient; also emphasized that residents, students are part of our team.  Discussed optimized OB schedule and video visits. Advised can have an in-office visit whenever she feels she needs to be seen.  Advised to call during normal business hours and there is an after-hours nurse line available.     Follow-up: Return in about 4 weeks (around 09/23/2019) for Return OB visit.   Orders Placed This Encounter  Procedures  . Ambulatory referral to Neurology    Raelyn Mora MSN, CNM 08/26/2019

## 2019-08-26 NOTE — Patient Instructions (Addendum)
Second Trimester of Pregnancy The second trimester is from week 14 through week 27 (months 4 through 6). The second trimester is often a time when you feel your best. Your body has adjusted to being pregnant, and you begin to feel better physically. Usually, morning sickness has lessened or quit completely, you may have more energy, and you may have an increase in appetite. The second trimester is also a time when the fetus is growing rapidly. At the end of the sixth month, the fetus is about 9 inches long and weighs about 1 pounds. You will likely begin to feel the baby move (quickening) between 16 and 20 weeks of pregnancy. Body changes during your second trimester Your body continues to go through many changes during your second trimester. The changes vary from woman to woman.  Your weight will continue to increase. You will notice your lower abdomen bulging out.  You may begin to get stretch marks on your hips, abdomen, and breasts.  You may develop headaches that can be relieved by medicines. The medicines should be approved by your health care provider.  You may urinate more often because the fetus is pressing on your bladder.  You may develop or continue to have heartburn as a result of your pregnancy.  You may develop constipation because certain hormones are causing the muscles that push waste through your intestines to slow down.  You may develop hemorrhoids or swollen, bulging veins (varicose veins).  You may have back pain. This is caused by: ? Weight gain. ? Pregnancy hormones that are relaxing the joints in your pelvis. ? A shift in weight and the muscles that support your balance.  Your breasts will continue to grow and they will continue to become tender.  Your gums may bleed and may be sensitive to brushing and flossing.  Dark spots or blotches (chloasma, mask of pregnancy) may develop on your face. This will likely fade after the baby is born.  A dark line from your  belly button to the pubic area (linea nigra) may appear. This will likely fade after the baby is born.  You may have changes in your hair. These can include thickening of your hair, rapid growth, and changes in texture. Some women also have hair loss during or after pregnancy, or hair that feels dry or thin. Your hair will most likely return to normal after your baby is born. What to expect at prenatal visits During a routine prenatal visit:  You will be weighed to make sure you and the fetus are growing normally.  Your blood pressure will be taken.  Your abdomen will be measured to track your baby's growth.  The fetal heartbeat will be listened to.  Any test results from the previous visit will be discussed. Your health care provider may ask you:  How you are feeling.  If you are feeling the baby move.  If you have had any abnormal symptoms, such as leaking fluid, bleeding, severe headaches, or abdominal cramping.  If you are using any tobacco products, including cigarettes, chewing tobacco, and electronic cigarettes.  If you have any questions. Other tests that may be performed during your second trimester include:  Blood tests that check for: ? Low iron levels (anemia). ? High blood sugar that affects pregnant women (gestational diabetes) between 24 and 28 weeks. ? Rh antibodies. This is to check for a protein on red blood cells (Rh factor).  Urine tests to check for infections, diabetes, or protein in the   urine.  An ultrasound to confirm the proper growth and development of the baby.  An amniocentesis to check for possible genetic problems.  Fetal screens for spina bifida and Down syndrome.  HIV (human immunodeficiency virus) testing. Routine prenatal testing includes screening for HIV, unless you choose not to have this test. Follow these instructions at home: Medicines  Follow your health care provider's instructions regarding medicine use. Specific medicines may be  either safe or unsafe to take during pregnancy.  Take a prenatal vitamin that contains at least 600 micrograms (mcg) of folic acid.  If you develop constipation, try taking a stool softener if your health care provider approves. Eating and drinking   Eat a balanced diet that includes fresh fruits and vegetables, whole grains, good sources of protein such as meat, eggs, or tofu, and low-fat dairy. Your health care provider will help you determine the amount of weight gain that is right for you.  Avoid raw meat and uncooked cheese. These carry germs that can cause birth defects in the baby.  If you have low calcium intake from food, talk to your health care provider about whether you should take a daily calcium supplement.  Limit foods that are high in fat and processed sugars, such as fried and sweet foods.  To prevent constipation: ? Drink enough fluid to keep your urine clear or pale yellow. ? Eat foods that are high in fiber, such as fresh fruits and vegetables, whole grains, and beans. Activity  Exercise only as directed by your health care provider. Most women can continue their usual exercise routine during pregnancy. Try to exercise for 30 minutes at least 5 days a week. Stop exercising if you experience uterine contractions.  Avoid heavy lifting, wear low heel shoes, and practice good posture.  A sexual relationship may be continued unless your health care provider directs you otherwise. Relieving pain and discomfort  Wear a good support bra to prevent discomfort from breast tenderness.  Take warm sitz baths to soothe any pain or discomfort caused by hemorrhoids. Use hemorrhoid cream if your health care provider approves.  Rest with your legs elevated if you have leg cramps or low back pain.  If you develop varicose veins, wear support hose. Elevate your feet for 15 minutes, 3-4 times a day. Limit salt in your diet. Prenatal Care  Write down your questions. Take them to  your prenatal visits.  Keep all your prenatal visits as told by your health care provider. This is important. Safety  Wear your seat belt at all times when driving.  Make a list of emergency phone numbers, including numbers for family, friends, the hospital, and police and fire departments. General instructions  Ask your health care provider for a referral to a local prenatal education class. Begin classes no later than the beginning of month 6 of your pregnancy.  Ask for help if you have counseling or nutritional needs during pregnancy. Your health care provider can offer advice or refer you to specialists for help with various needs.  Do not use hot tubs, steam rooms, or saunas.  Do not douche or use tampons or scented sanitary pads.  Do not cross your legs for long periods of time.  Avoid cat litter boxes and soil used by cats. These carry germs that can cause birth defects in the baby and possibly loss of the fetus by miscarriage or stillbirth.  Avoid all smoking, herbs, alcohol, and unprescribed drugs. Chemicals in these products can affect the formation   and growth of the baby.  Do not use any products that contain nicotine or tobacco, such as cigarettes and e-cigarettes. If you need help quitting, ask your health care provider.  Visit your dentist if you have not gone yet during your pregnancy. Use a soft toothbrush to brush your teeth and be gentle when you floss. Contact a health care provider if:  You have dizziness.  You have mild pelvic cramps, pelvic pressure, or nagging pain in the abdominal area.  You have persistent nausea, vomiting, or diarrhea.  You have a bad smelling vaginal discharge.  You have pain when you urinate. Get help right away if:  You have a fever.  You are leaking fluid from your vagina.  You have spotting or bleeding from your vagina.  You have severe abdominal cramping or pain.  You have rapid weight gain or weight loss.  You have  shortness of breath with chest pain.  You notice sudden or extreme swelling of your face, hands, ankles, feet, or legs.  You have not felt your baby move in over an hour.  You have severe headaches that do not go away when you take medicine.  You have vision changes. Summary  The second trimester is from week 14 through week 27 (months 4 through 6). It is also a time when the fetus is growing rapidly.  Your body goes through many changes during pregnancy. The changes vary from woman to woman.  Avoid all smoking, herbs, alcohol, and unprescribed drugs. These chemicals affect the formation and growth your baby.  Do not use any tobacco products, such as cigarettes, chewing tobacco, and e-cigarettes. If you need help quitting, ask your health care provider.  Contact your health care provider if you have any questions. Keep all prenatal visits as told by your health care provider. This is important. This information is not intended to replace advice given to you by your health care provider. Make sure you discuss any questions you have with your health care provider. Document Revised: 06/26/2018 Document Reviewed: 04/09/2016 Elsevier Patient Education  2020 [employer] Sciatica  Sciatica is pain, numbness, weakness, or tingling along the path of the sciatic nerve. The sciatic nerve starts in the lower back and runs down the back of each leg. The nerve controls the muscles in the lower leg and in the back of the knee. It also provides feeling (sensation) to the back of the thigh, the lower leg, and the sole of the foot. Sciatica is a symptom of another medical condition that pinches or puts pressure on the sciatic nerve. Sciatica most often only affects one side of the body. Sciatica usually goes away on its own or with treatment. In some cases, sciatica may come back (recur). What are the causes? This condition is caused by pressure on the sciatic nerve or pinching of the nerve. This may be  the result of:  A disk in between the bones of the spine bulging out too far (herniated disk).  Age-related changes in the spinal disks.  A pain disorder that affects a muscle in the buttock.  Extra bone growth near the sciatic nerve.  A break (fracture) of the pelvis.  Pregnancy.  Tumor. This is rare. What increases the risk? The following factors may make you more likely to develop this condition:  Playing sports that place pressure or stress on the spine.  Having poor strength and flexibility.  A history of back injury or surgery.  Sitting for long periods of  time.  Doing activities that involve repetitive bending or lifting.  Obesity. What are the signs or symptoms? Symptoms can vary from mild to very severe, and they may include:  Any of these problems in the lower back, leg, hip, or buttock: ? Mild tingling, numbness, or dull aches. ? Burning sensations. ? Sharp pains.  Numbness in the back of the calf or the sole of the foot.  Leg weakness.  Severe back pain that makes movement difficult. Symptoms may get worse when you cough, sneeze, or laugh, or when you sit or stand for long periods of time. How is this diagnosed? This condition may be diagnosed based on:  Your symptoms and medical history.  A physical exam.  Blood tests.  Imaging tests, such as: ? X-rays. ? MRI. ? CT scan. How is this treated? In many cases, this condition improves on its own without treatment. However, treatment may include:  Reducing or modifying physical activity.  Exercising and stretching.  Icing and applying heat to the affected area.  Medicines that help to: ? Relieve pain and swelling. ? Relax your muscles.  Injections of medicines that help to relieve pain, irritation, and inflammation around the sciatic nerve (steroids).  Surgery. Follow these instructions at home: Medicines  Take over-the-counter and prescription medicines only as told by your health  care provider.  Ask your health care provider if the medicine prescribed to you: ? Requires you to avoid driving or using heavy machinery. ? Can cause constipation. You may need to take these actions to prevent or treat constipation:  Drink enough fluid to keep your urine pale yellow.  Take over-the-counter or prescription medicines.  Eat foods that are high in fiber, such as beans, whole grains, and fresh fruits and vegetables.  Limit foods that are high in fat and processed sugars, such as fried or sweet foods. Managing pain      If directed, put ice on the affected area. ? Put ice in a plastic bag. ? Place a towel between your skin and the bag. ? Leave the ice on for 20 minutes, 2-3 times a day.  If directed, apply heat to the affected area. Use the heat source that your health care provider recommends, such as a moist heat pack or a heating pad. ? Place a towel between your skin and the heat source. ? Leave the heat on for 20-30 minutes. ? Remove the heat if your skin turns bright red. This is especially important if you are unable to feel pain, heat, or cold. You may have a greater risk of getting burned. Activity   Return to your normal activities as told by your health care provider. Ask your health care provider what activities are safe for you.  Avoid activities that make your symptoms worse.  Take brief periods of rest throughout the day. ? When you rest for longer periods, mix in some mild activity or stretching between periods of rest. This will help to prevent stiffness and pain. ? Avoid sitting for long periods of time without moving. Get up and move around at least one time each hour.  Exercise and stretch regularly, as told by your health care provider.  Do not lift anything that is heavier than 10 lb (4.5 kg) while you have symptoms of sciatica. When you do not have symptoms, you should still avoid heavy lifting, especially repetitive heavy lifting.  When  you lift objects, always use proper lifting technique, which includes: ? Bending your knees. ? Keeping  the load close to your body. ? Avoiding twisting. General instructions  Maintain a healthy weight. Excess weight puts extra stress on your back.  Wear supportive, comfortable shoes. Avoid wearing high heels.  Avoid sleeping on a mattress that is too soft or too hard. A mattress that is firm enough to support your back when you sleep may help to reduce your pain.  Keep all follow-up visits as told by your health care provider. This is important. Contact a health care provider if:  You have pain that: ? Wakes you up when you are sleeping. ? Gets worse when you lie down. ? Is worse than you have experienced in the past. ? Lasts longer than 4 weeks.  You have an unexplained weight loss. Get help right away if:  You are not able to control when you urinate or have bowel movements (incontinence).  You have: ? Weakness in your lower back, pelvis, buttocks, or legs that gets worse. ? Redness or swelling of your back. ? A burning sensation when you urinate. Summary  Sciatica is pain, numbness, weakness, or tingling along the path of the sciatic nerve.  This condition is caused by pressure on the sciatic nerve or pinching of the nerve.  Sciatica can cause pain, numbness, or tingling in the lower back, legs, hips, and buttocks.  Treatment often includes rest, exercise, medicines, and applying ice or heat. This information is not intended to replace advice given to you by your health care provider. Make sure you discuss any questions you have with your health care provider. Document Revised: 03/23/2018 Document Reviewed: 03/23/2018 Elsevier Patient Education  [place].

## 2019-08-26 NOTE — BH Specialist Note (Signed)
Integrated Behavioral Health Initial Visit  MRN: [id number] Name: Carol Rangel  Number of Integrated Behavioral Health Clinician visits:: 1 Session Start time: 3:15pm  Session End time: 3:30pm Total time: 15 mins via face to face @ [facility]   Type of Service: Integrated Behavioral Health- Individual Interpretor:no  Interpretor Name and Language: none    Warm Hand Off Completed.       SUBJECTIVE: Carol Rangel is a 34 y.o. female accompanied by n/a Patient was referred by Carol Cheshire RN for high phq9 score  Patient reports the following symptoms/concerns: worry Duration of problem: two weeks ; Severity of problem: mild   OBJECTIVE: Mood: sad  and Affect: congruent  Risk of harm to self or others: No risk of harm to self or others.   LIFE CONTEXT: Family and Social: Lives with immediate family in [place] School/Work: n/a Self-Care:n/a Life Changes: new pregnancy   GOALS ADDRESSED: Patient will: 1. Reduce symptoms of: depressed mood  2. Increase knowledge and/or ability of: learn coping skills to alleviate symptoms   3. Demonstrate ability to: self manage symptoms   INTERVENTIONS: Interventions utilized: supportive counseling   Standardized Assessments completed:    Initial Prenatal from 08/26/2019 in [facility]  PHQ-9 Total Score 12      ASSESSMENT: Patient currently experiencing adjustment disorder w depressed mood. Carol Rangel reports she is very nervous about this pregnancy due to the possibility of her baby having down syndrome. Carol Rangel reports depressed mood and will usually sleep as a method to cope.      Patient may benefit from integrated behavioral health   PLAN: 1. Follow up with behavioral health clinician on : 4 weeks 2. Behavioral recommendations: Continue taking prenatal vitamins as directed, mindfulness techniques, and increase social interactions.  3. Referral(s): none  4. "From scale of 1-10, how likely are you to follow plan?":    Gwyndolyn Saxon, LCSW

## 2019-08-27 LAB — CERVICOVAGINAL ANCILLARY ONLY
Bacterial Vaginitis (gardnerella): NEGATIVE
Candida Glabrata: NEGATIVE
Candida Vaginitis: NEGATIVE
Chlamydia: NEGATIVE
Comment: NEGATIVE
Comment: NEGATIVE
Comment: NEGATIVE
Comment: NEGATIVE
Comment: NEGATIVE
Comment: NORMAL
Neisseria Gonorrhea: NEGATIVE
Trichomonas: NEGATIVE

## 2019-08-30 LAB — CYTOLOGY - PAP
Comment: NEGATIVE
Diagnosis: NEGATIVE
High risk HPV: NEGATIVE

## 2019-09-01 ENCOUNTER — Encounter: Payer: Self-pay | Admitting: General Practice

## 2019-09-09 ENCOUNTER — Encounter: Payer: Self-pay | Admitting: Obstetrics and Gynecology

## 2019-09-23 ENCOUNTER — Other Ambulatory Visit: Payer: Self-pay

## 2019-09-23 ENCOUNTER — Ambulatory Visit: Payer: Medicaid Other | Admitting: *Deleted

## 2019-09-23 ENCOUNTER — Ambulatory Visit: Payer: Medicaid Other | Attending: Obstetrics and Gynecology

## 2019-09-23 ENCOUNTER — Other Ambulatory Visit: Payer: Self-pay | Admitting: *Deleted

## 2019-09-23 ENCOUNTER — Ambulatory Visit (INDEPENDENT_AMBULATORY_CARE_PROVIDER_SITE_OTHER): Payer: Medicaid Other | Admitting: Obstetrics and Gynecology

## 2019-09-23 VITALS — BP 110/67 | HR 87 | Temp 98.1°F | Wt 147.2 lb

## 2019-09-23 DIAGNOSIS — O0930 Supervision of pregnancy with insufficient antenatal care, unspecified trimester: Secondary | ICD-10-CM

## 2019-09-23 DIAGNOSIS — Z363 Encounter for antenatal screening for malformations: Secondary | ICD-10-CM | POA: Diagnosis not present

## 2019-09-23 DIAGNOSIS — O0932 Supervision of pregnancy with insufficient antenatal care, second trimester: Secondary | ICD-10-CM | POA: Diagnosis not present

## 2019-09-23 DIAGNOSIS — O09512 Supervision of elderly primigravida, second trimester: Secondary | ICD-10-CM | POA: Diagnosis not present

## 2019-09-23 DIAGNOSIS — Z3400 Encounter for supervision of normal first pregnancy, unspecified trimester: Secondary | ICD-10-CM | POA: Diagnosis present

## 2019-09-23 DIAGNOSIS — Z3402 Encounter for supervision of normal first pregnancy, second trimester: Secondary | ICD-10-CM

## 2019-09-23 DIAGNOSIS — Z3A24 24 weeks gestation of pregnancy: Secondary | ICD-10-CM

## 2019-09-23 DIAGNOSIS — O283 Abnormal ultrasonic finding on antenatal screening of mother: Secondary | ICD-10-CM

## 2019-09-23 DIAGNOSIS — O358XX0 Maternal care for other (suspected) fetal abnormality and damage, not applicable or unspecified: Secondary | ICD-10-CM | POA: Diagnosis present

## 2019-09-23 DIAGNOSIS — Z789 Other specified health status: Secondary | ICD-10-CM

## 2019-09-23 DIAGNOSIS — O09519 Supervision of elderly primigravida, unspecified trimester: Secondary | ICD-10-CM | POA: Insufficient documentation

## 2019-09-23 DIAGNOSIS — Z362 Encounter for other antenatal screening follow-up: Secondary | ICD-10-CM

## 2019-09-23 DIAGNOSIS — Z603 Acculturation difficulty: Secondary | ICD-10-CM

## 2019-09-23 DIAGNOSIS — O35EXX0 Maternal care for other (suspected) fetal abnormality and damage, fetal genitourinary anomalies, not applicable or unspecified: Secondary | ICD-10-CM

## 2019-09-23 IMAGING — US US MFM OB FOLLOW-UP
1 series · 13 of 28 positions shown · non-contrast
Comparison: none

[Series 1: us mfm ob follow-up · 13 of 46 slices shown]
[im 2/46]
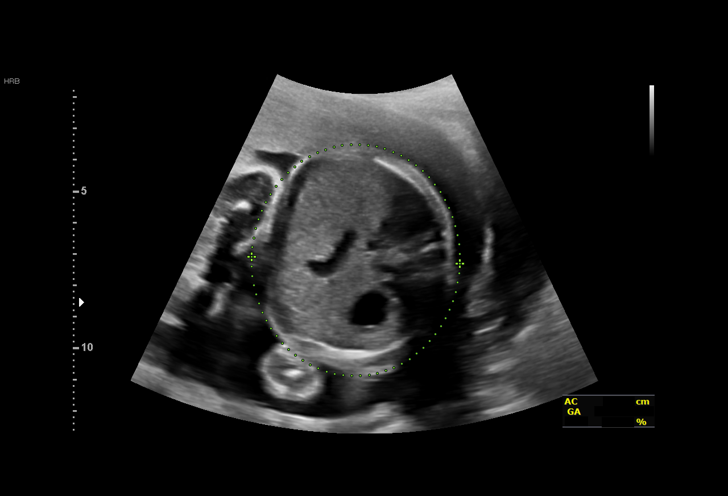
[im 6/46]
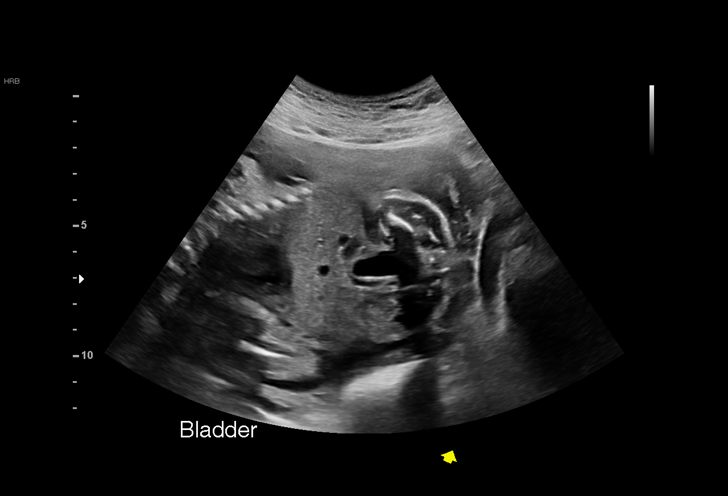
[im 9/46]
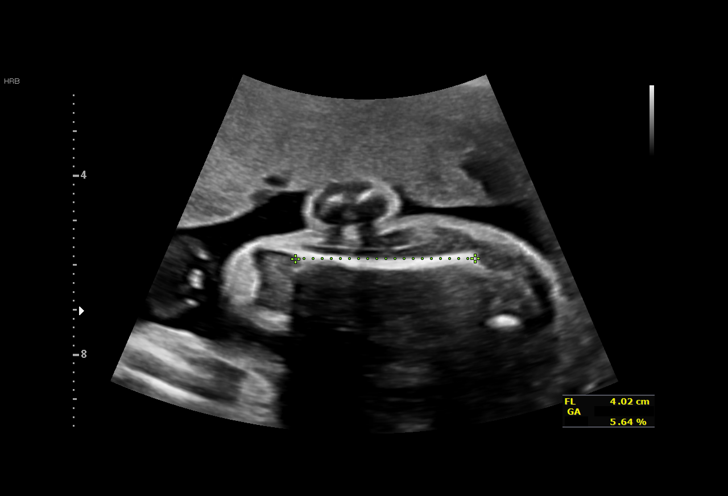
[im 12/46]
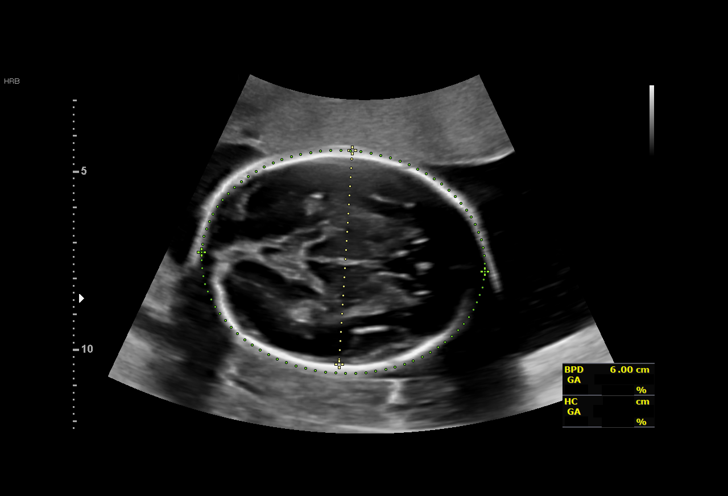
[im 16/46]
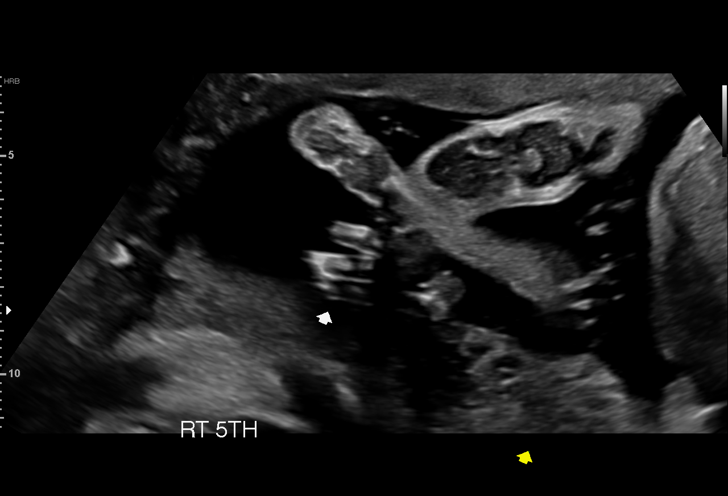
[im 19/46]
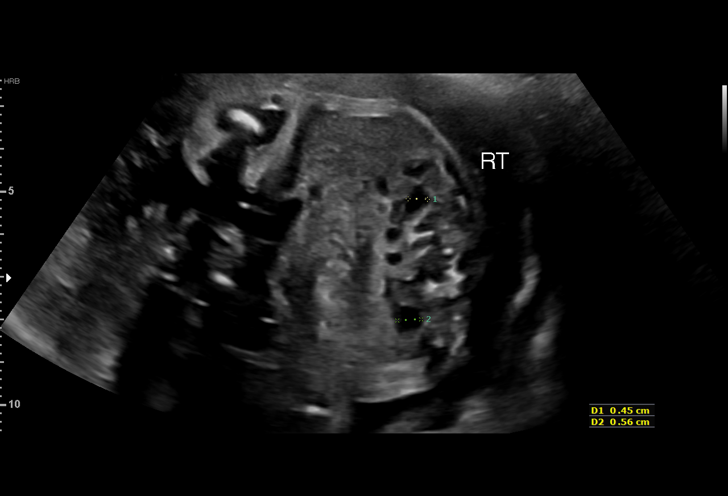
[im 24/46]
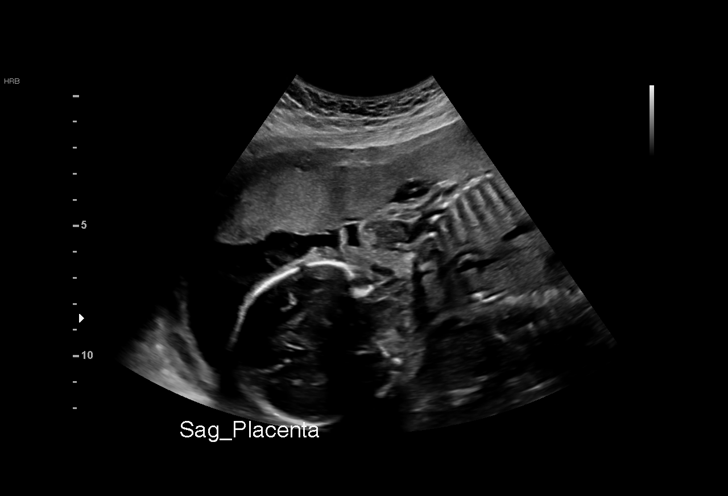
[im 27/46]
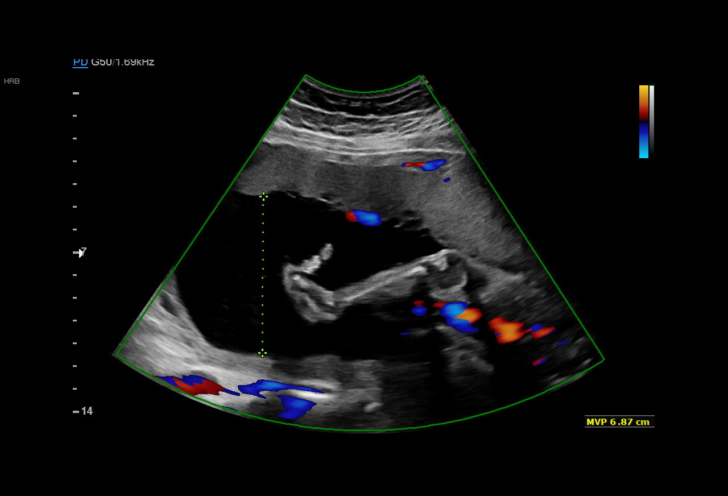
[im 31/46]
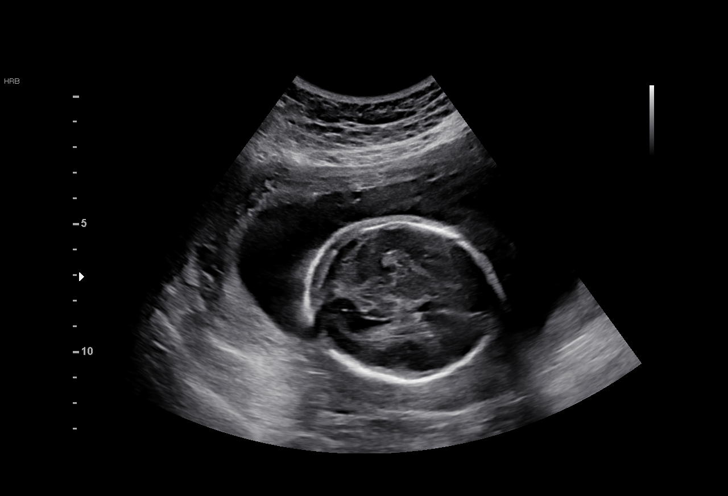
[im 34/46]
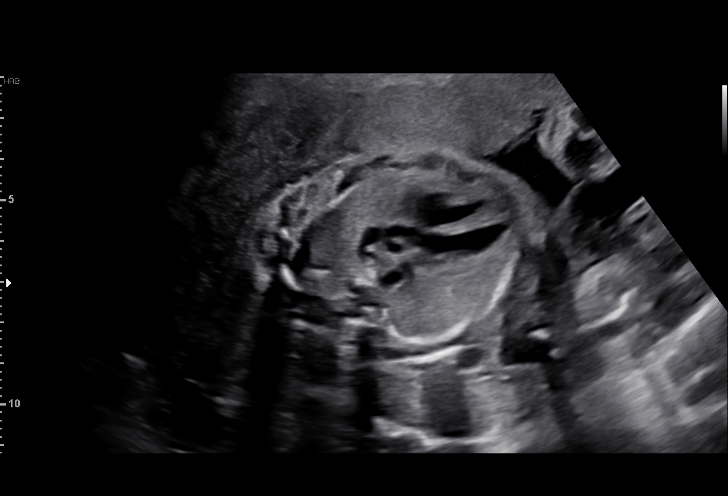
[im 37/46]
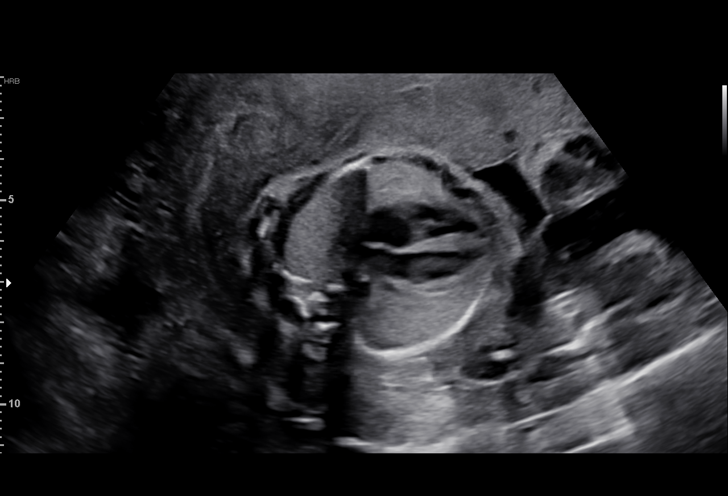
[im 41/46]
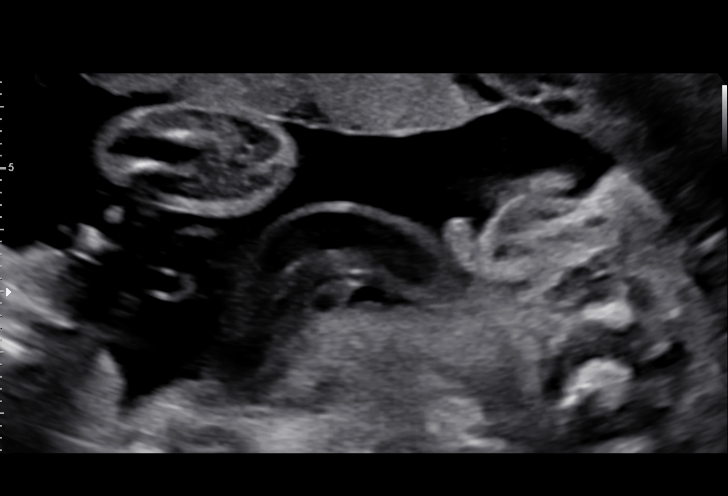
[im 44/46]
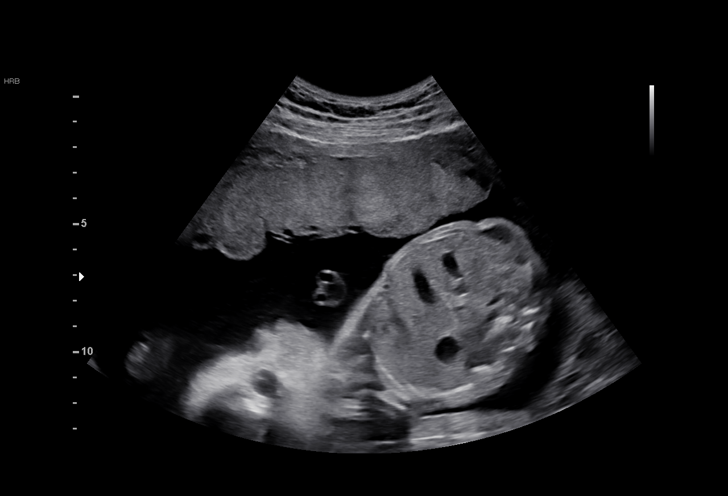

[13 of 28 positions shown; findings below may reference images not displayed]

TIGER

Indications

 Pyelectasis of fetus on prenatal ultrasound    [0N]
 Encounter for antenatal screening for          [0N]
 malformations (LR NIPS)
 Advanced maternal age primigravida 35+,        [0N]
 second trimester
 Late prenatal care, second trimester           [0N]
 24 weeks gestation of pregnancy
Vital Signs

 BMI:
Fetal Evaluation

 Num Of Fetuses:         1
 Fetal Heart Rate(bpm):  148
 Cardiac Activity:       Observed
 Presentation:           Transverse, head to maternal right
 Placenta:               Anterior
 P. Cord Insertion:      Previously Visualized

 Amniotic Fluid
 AFI FV:      Within normal limits
Biometry

 BPD:      59.9  mm     G. Age:  24w 3d         43  %    CI:        72.09   %    70 - 86
                                                         FL/HC:      18.0   %    18.7 -
 HC:      224.5  mm     G. Age:  24w 3d         32  %    HC/AC:      1.01        1.05 -
 AC:      223.1  mm     G. Age:  26w 5d         95  %    FL/BPD:     67.3   %    71 - 87
 FL:       40.3  mm     G. Age:  23w 0d          6  %    FL/AC:      18.1   %    20 - 24

 Est. FW:     767  gm    1 lb 11 oz      71  %
OB History

 Maternal Racial/Ethnic Group:   Asian
 Gravidity:    1         Term:   0        Prem:   0        SAB:   0
 TOP:          0       Ectopic:  0        Living: 0
Gestational Age

 LMP:           24w 3d        Date:  [DATE]                 EDD:   [DATE]
 U/S Today:     24w 5d                                        EDD:   [DATE]
 Best:          24w 3d     Det. By:  LMP  ([DATE])          EDD:   [DATE]
Anatomy

 Cranium:               Appears normal         Aortic Arch:            Previously seen
 Cavum:                 Appears normal         Ductal Arch:            Previously seen
 Ventricles:            Appears normal         Diaphragm:              Appears normal
 Choroid Plexus:        Previously seen        Stomach:                Appears normal, left
                                                                       sided
 Cerebellum:            Previously seen        Abdomen:                Appears normal
 Posterior Fossa:       Previously seen        Abdominal Wall:         Previously seen
 Nuchal Fold:           Not applicable (>20    Cord Vessels:           Previously seen
                        wks GA)
 Face:                  Orbits and profile     Kidneys:                Bilat pyelectasis,
                        previously seen
                                                                       Rt 5mm, Lt 6mm
 Lips:                  Previously seen        Bladder:                Appears normal
 Thoracic:              Appears normal         Spine:                  Previously seen
 Heart:                 Appears normal         Upper Extremities:      Previously seen
                        (4CH, axis, and
                        situs)
 RVOT:                  Previously seen        Lower Extremities:      Previously seen
 LVOT:                  Previously seen

 Other:  Heels visualized. Nasal bone visualized. Technically difficult due to
         fetal position.
Cervix Uterus Adnexa

 Cervix
 Not visualized (advanced GA >[0N])
Comments

 This patient was seen for a follow up exam due to mild
 bilateral pyelectasis that was noted during her prior
 ultrasound exam.  She denies any problems since her last
 exam.
 She had a cell free DNA test which indicated a low risk for
 trisomy 21, 18, and 13.  A male fetus is predicted.
 She was informed that the fetal growth and amniotic fluid
 level appears appropriate for her gestational age.
 Mild bilateral pyelectasis measuring 0.5 to 0.6 cm continues
 to be noted.  The patient was reassured that this is most
 likely a normal variant and will most likely resolve later in her
 pregnancy or after delivery.  She was also reassured that
 based on her negative cell free DNA test, her baby's risk of
 having Down syndrome is low.

 A follow-up exam was scheduled at around 32 weeks for
 assessment of the fetal kidneys.

## 2019-09-23 NOTE — Patient Instructions (Signed)
Glucose Tolerance Test During Pregnancy Why am I having this test? The glucose tolerance test (GTT) is done to check how your body processes sugar (glucose). This is one of several tests used to diagnose diabetes that develops during pregnancy (gestational diabetes mellitus). Gestational diabetes is a temporary form of diabetes that some women develop during pregnancy. It usually occurs during the second trimester of pregnancy and goes away after delivery. Testing (screening) for gestational diabetes usually occurs between 24 and 28 weeks of pregnancy. You may have the GTT test after having a 1-hour glucose screening test if the results from that test indicate that you may have gestational diabetes. You may also have this test if:  You have a history of gestational diabetes.  You have a history of giving birth to very large babies or have experienced repeated fetal loss (stillbirth).  You have signs and symptoms of diabetes, such as: ? Changes in your vision. ? Tingling or numbness in your hands or feet. ? Changes in hunger, thirst, and urination that are not otherwise explained by your pregnancy. What is being tested? This test measures the amount of glucose in your blood at different times during a period of 3 hours. This indicates how well your body is able to process glucose. What kind of sample is taken?  Blood samples are required for this test. They are usually collected by inserting a needle into a blood vessel. How do I prepare for this test?  For 3 days before your test, eat normally. Have plenty of carbohydrate-rich foods.  Follow instructions from your health care provider about: ? Eating or drinking restrictions on the day of the test. You may be asked to not eat or drink anything other than water (fast) starting 8-10 hours before the test. ? Changing or stopping your regular medicines. Some medicines may interfere with this test. Tell a health care provider about:  All  medicines you are taking, including vitamins, herbs, eye drops, creams, and over-the-counter medicines.  Any blood disorders you have.  Any surgeries you have had.  Any medical conditions you have. What happens during the test? First, your blood glucose will be measured. This is referred to as your fasting blood glucose, since you fasted before the test. Then, you will drink a glucose solution that contains a certain amount of glucose. Your blood glucose will be measured again 1, 2, and 3 hours after drinking the solution. This test takes about 3 hours to complete. You will need to stay at the testing location during this time. During the testing period:  Do not eat or drink anything other than the glucose solution.  Do not exercise.  Do not use any products that contain nicotine or tobacco, such as cigarettes and e-cigarettes. If you need help stopping, ask your health care provider. The testing procedure may vary among health care providers and hospitals. How are the results reported? Your results will be reported as milligrams of glucose per deciliter of blood (mg/dL) or millimoles per liter (mmol/L). Your health care provider will compare your results to normal ranges that were established after testing a large group of people (reference ranges). Reference ranges may vary among labs and hospitals. For this test, common reference ranges are:  Fasting: less than 95-105 mg/dL (5.3-5.8 mmol/L).  1 hour after drinking glucose: less than 180-190 mg/dL (10.0-10.5 mmol/L).  2 hours after drinking glucose: less than 155-165 mg/dL (8.6-9.2 mmol/L).  3 hours after drinking glucose: 140-145 mg/dL (7.8-8.1 mmol/L). What do the   results mean? Results within reference ranges are considered normal, meaning that your glucose levels are well-controlled. If two or more of your blood glucose levels are high, you may be diagnosed with gestational diabetes. If only one level is high, your health care  provider may suggest repeat testing or other tests to confirm a diagnosis. Talk with your health care provider about what your results mean. Questions to ask your health care provider Ask your health care provider, or the department that is doing the test:  When will my results be ready?  How will I get my results?  What are my treatment options?  What other tests do I need?  What are my next steps? Summary  The glucose tolerance test (GTT) is one of several tests used to diagnose diabetes that develops during pregnancy (gestational diabetes mellitus). Gestational diabetes is a temporary form of diabetes that some women develop during pregnancy.  You may have the GTT test after having a 1-hour glucose screening test if the results from that test indicate that you may have gestational diabetes. You may also have this test if you have any symptoms or risk factors for gestational diabetes.  Talk with your health care provider about what your results mean. This information is not intended to replace advice given to you by your health care provider. Make sure you discuss any questions you have with your health care provider. Document Revised: 06/25/2018 Document Reviewed: 10/14/2016 Elsevier Patient Education  2020 Elsevier Inc.  

## 2019-09-23 NOTE — Progress Notes (Signed)
LOW-RISK PREGNANCY OFFICE VISIT Patient name: Carol Rangel MRN [id number]  Date of birth: 07/20/1985 Chief Complaint:   Routine Prenatal Visit  History of Present Illness:   Carol Rangel is a 34 y.o. G1P0 female at 103w3d with an Estimated Date of Delivery: 01/10/20 being seen today for ongoing management of a low-risk pregnancy.  Today she reports no complaints. Contractions: Not present. Vag. Bleeding: None.  Movement: Present. denies leaking of fluid. Review of Systems:   Pertinent items are noted in HPI Denies abnormal vaginal discharge w/ itching/odor/irritation, headaches, visual changes, shortness of breath, chest pain, abdominal pain, severe nausea/vomiting, or problems with urination or bowel movements unless otherwise stated above. Pertinent History Reviewed:  Reviewed past medical,surgical, social, obstetrical and family history.  Reviewed problem list, medications and allergies. Physical Assessment:   Vitals:   09/23/19 1615  BP: 110/67  Pulse: 87  Temp: 98.1 F (36.7 C)  Weight: 147 lb 3.2 oz (66.8 kg)  Body mass index is 24.5 kg/m.        Physical Examination:   General appearance: Well appearing, and in no distress  Mental status: Alert, oriented to person, place, and time  Skin: Warm & dry  Cardiovascular: Normal heart rate noted  Respiratory: Normal respiratory effort, no distress  Abdomen: Soft, gravid, nontender  Pelvic: Cervical exam deferred         Extremities: Edema: None  Fetal Status: Fetal Heart Rate (bpm): 140 Fundal Height: 22 cm Movement: Present    [place] MFM OB FOLLOW UP (Accession [id number]) (Order [id number])  Narrative & Impression  ----------------------------------------------------------------------  OBSTETRICS REPORT                       (Signed Final 09/23/2019 03:17 pm) ---------------------------------------------------------------------- Patient Info  ID #:       [id number]                          D.O.B.:  November 23, 1985 (34 yrs)  Name:        Carol Rangel                      Visit Date: 09/23/2019 02:28 pm ---------------------------------------------------------------------- Performed By  Attending:        Ma Rings MD         Referred By:      [facility] MAU/Triage  Performed By:     Lenise Arena        [facility] ---------------------------------------------------------------------- Orders  #  Description                           Code        Ordered By  1  [place] MFM OB FOLLOW UP                   95638.75    CORENTHIAN  BOOKER ----------------------------------------------------------------------  #  Order #                     Accession #                Episode #  1  [id number]                   [id number]                 [id number] ---------------------------------------------------------------------- Indications  Pyelectasis of fetus on prenatal ultrasound    O28.3  Encounter for antenatal screening for          Z36.3  malformations (LR NIPS)  Advanced maternal age primigravida 73+,        O37.512  second trimester  Late prenatal care, second trimester           O09.32  [redacted] weeks gestation of pregnancy                Z3A.24 ---------------------------------------------------------------------- Vital Signs  Weight (lb): 136                               Height:        5'5"  BMI:         22.63 ---------------------------------------------------------------------- Fetal Evaluation  Num Of Fetuses:         1  Fetal Heart Rate(bpm):  148  Cardiac Activity:       Observed  Presentation:           Transverse, head to maternal right  Placenta:               Anterior  P. Cord Insertion:      Previously Visualized  Amniotic Fluid  AFI FV:      Within normal limits ---------------------------------------------------------------------- Biometry  BPD:      59.9  mm     G. Age:   24w 3d         43  %    CI:        72.09   %    70 - 86                                                          FL/HC:      18.0   %    18.7 - 20.9  HC:      224.5  mm     G. Age:  24w 3d         32  %    HC/AC:      1.01        1.05 - 1.21  AC:      223.1  mm     G. Age:  26w 5d         95  %    FL/BPD:     67.3   %    71 - 87  FL:       40.3  mm     G. Age:  23w 0d          6  %    FL/AC:      18.1   %    20 - 24  Est. FW:     767  gm    1 lb 11 oz      71  % ---------------------------------------------------------------------- OB History  Maternal Racial/Ethnic Group:   Asian  Gravidity:    1         Term:   0        Prem:   0        SAB:   0  TOP:          0       Ectopic:  0        Living: 0 ---------------------------------------------------------------------- Gestational Age  LMP:           24w 3d        Date:  04/05/19                 EDD:   01/10/20  U/S Today:     24w 5d                                        EDD:   01/08/20  Best:          24w 3d     Det. By:  LMP  (04/05/19)          EDD:   01/10/20 ---------------------------------------------------------------------- Anatomy  Cranium:               Appears normal         Aortic Arch:            Previously seen  Cavum:                 Appears normal         Ductal Arch:            Previously seen  Ventricles:            Appears normal         Diaphragm:              Appears normal  Choroid Plexus:        Previously seen        Stomach:                Appears normal, left                                                                        sided  Cerebellum:            Previously seen        Abdomen:                Appears normal  Posterior Fossa:       Previously seen        Abdominal Wall:         Previously seen  Nuchal Fold:           Not applicable (>20    Cord Vessels:           Previously seen  wks GA)  Face:                  Orbits and profile     Kidneys:                Bilat pyelectasis,                          previously seen                                                                        Rt 52mm, Lt 52mm  Lips:                  Previously seen        Bladder:                Appears normal  Thoracic:              Appears normal         Spine:                  Previously seen  Heart:                 Appears normal         Upper Extremities:      Previously seen                         (4CH, axis, and                         situs)  RVOT:                  Previously seen        Lower Extremities:      Previously seen  LVOT:                  Previously seen  Other:  Heels visualized. Nasal bone visualized. Technically difficult due to          fetal position. ---------------------------------------------------------------------- Cervix Uterus Adnexa  Cervix  Not visualized (advanced GA >24wks) ---------------------------------------------------------------------- Comments  This patient was seen for a follow up exam due to mild  bilateral pyelectasis that was noted during her prior  ultrasound exam.  She denies any problems since her last  exam.  She had a cell free DNA test which indicated a low risk for  trisomy 21, 18, and 13.  A female fetus is predicted.  She was informed that the fetal growth and amniotic fluid  level appears appropriate for her gestational age.  Mild bilateral pyelectasis measuring 0.5 to 0.6 cm continues  to be noted.  The patient was reassured that this is most  likely a normal variant and will most likely resolve later in her  pregnancy or after delivery.  She was also reassured that  based on her negative cell free DNA test, her baby's risk of  having Down syndrome is low.  A follow-up exam was scheduled at around 32 weeks for  assessment of the fetal kidneys. ----------------------------------------------------------------------  Ma RingsVictor Fang, MD Electronically Signed Final Report   09/23/2019 03:17  pm ----------------------------------------------------------------------     Assessment & Plan:  1) Low-risk pregnancy G1P0 at 1748w3d with an Estimated Date of Delivery: 01/10/20   2) Supervision of normal first pregnancy, antepartum - Anticipatory guidance for 2 hr GTT and 3rd trimester labs - Information provided on glucose tolerance test  3) Language barrier affecting health care - [place] Video interpreter Sabae [phone]#[id number]   Meds: No orders of the defined types were placed in this encounter.  Labs/procedures today: none  Plan:  Continue routine obstetrical care   Reviewed: Preterm labor symptoms and general obstetric precautions including but not limited to vaginal bleeding, contractions, leaking of fluid and fetal movement were reviewed in detail with the patient.  All questions were answered.  Follow-up: Return in about 4 weeks (around 10/21/2019) for Return OB 2hr GTT.  No orders of the defined types were placed in this encounter.  Raelyn Moraolitta Coady Train MSN, CNM 09/23/2019 4:33 PM

## 2019-09-23 NOTE — Progress Notes (Signed)
rou

## 2019-09-24 ENCOUNTER — Encounter: Payer: Self-pay | Admitting: Neurology

## 2019-09-24 ENCOUNTER — Ambulatory Visit (INDEPENDENT_AMBULATORY_CARE_PROVIDER_SITE_OTHER): Payer: Medicaid Other | Admitting: Neurology

## 2019-09-24 VITALS — BP 116/70 | HR 100 | Ht 65.0 in | Wt 146.0 lb

## 2019-09-24 DIAGNOSIS — M5431 Sciatica, right side: Secondary | ICD-10-CM | POA: Diagnosis not present

## 2019-09-24 HISTORY — DX: Sciatica, right side: M54.31

## 2019-09-24 MED ORDER — AMITRIPTYLINE HCL 10 MG PO TABS: ORAL_TABLET | ORAL | 3 refills | Status: DC

## 2019-09-24 NOTE — Patient Instructions (Signed)
We will start amitriptyline for the leg pain.  Elavil (amitriptyline) is an antidepressant medication that has many uses that may include headache, whiplash injuries, or for peripheral neuropathy pain. Side effects may include drowsiness, dry mouth, blurred vision, or constipation. As with any antidepressant medication, worsening depression may occur. If you had any significant side effects, please call our office. The full effects of this medication may take 7-10 days after starting the drug, or going up on the dose.

## 2019-09-24 NOTE — Progress Notes (Signed)
Reason for visit: Right-sided sciatica  Referring physician: Dr. Charlean Merl Carol Rangel is a 34 y.o. female  History of present illness:  Carol Rangel is a 34 year old right-handed Burmese female who is currently pregnant with a due date in October 2021.  The patient indicates a 2-year history of right-sided leg pain that has worsened some during the pregnancy.  The patient has had constant pain over the last 6 months.  Most the pain is in the right buttock area but the pain will go down the leg to the foot.  The patient has a pulling sensation in the buttock area.  The pain is worse with sitting, but it also is present with walking or lying flat.  She has some sensation of weakness in the leg as well.  She denies significant numbness.  She denies issues controlling the bowels or the bladder and has no problems with the left leg.  In January 2021 she apparently went to another emergency room outside of the [facility] system with pain and weakness of the left hand.  The patient was given an injection of some sort and the problem resolved.  She is no longer having issues with the left hand.  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  The patient is able to bend and squat, but she does have some discomfort with this.  She is sent to this office for further evaluation.  The patient speaks some English but the examination was assisted with an interpreter.  Past Medical History:  Diagnosis Date  . Medical history non-contributory     Past Surgical History:  Procedure Laterality Date  . BREAST SURGERY Right    ?abscess surgery x2    Family History  Problem Relation Age of Onset  . Cancer Maternal Grandmother   . Hypertension Mother     Social history:  reports that she has never smoked. She has never used smokeless tobacco. She reports that she does not drink alcohol and does not use drugs.  Medications:  Prior to Admission  medications   Medication Sig Start Date End Date Taking? Authorizing Provider  Prenatal Vit-Fe Fumarate-FA (MULTIVITAMIN-PRENATAL) 27-0.8 MG TABS tablet Take 1 tablet by mouth daily at 12 noon.   Yes [provider]  cyclobenzaprine (FLEXERIL) 10 MG tablet Take 1 tablet (10 mg total) by mouth 3 (three) times daily as needed. Patient not taking: Reported on 08/24/2019 08/05/19   Nugent, Odie Sera, NP  Elastic Bandages & Supports (COMFORT FIT MATERNITY SUPP SM) MISC 1 Units by Does not apply route daily as needed. Patient not taking: Reported on 09/24/2019 08/05/19   Nugent, Odie Sera, NP  famotidine (PEPCID) 20 MG tablet Take 1 tablet (20 mg total) by mouth 2 (two) times daily. Patient not taking: Reported on 08/26/2019 12/20/13   Harold Barban, MD  meclizine (ANTIVERT) 12.5 MG tablet Take 1 tablet (12.5 mg total) by mouth 3 (three) times daily as needed for dizziness or nausea. Patient not taking: Reported on 08/26/2019 09/12/18   Belinda Fisher, PA-C  promethazine (PHENERGAN) 12.5 MG tablet Take 1 tablet (12.5 mg total) by mouth every 6 (six) hours as needed for nausea or vomiting. Patient not taking: Reported on 08/26/2019 01/03/14   Baltazar Apo, MD     No Known Allergies  ROS:  Out of a complete 14 system review of symptoms, the patient complains only of the following symptoms, and all  other reviewed systems are negative.  Right buttock pain, right leg pain Current pregnancy  Blood pressure 116/70, pulse 100, height 5\' 5"  (1.651 m), weight 146 lb (66.2 kg), last menstrual period 04/05/2019.  Physical Exam  General: The patient is alert and cooperative at the time of the examination.  The patient currently is pregnant.  Eyes: Pupils are equal, round, and reactive to light. Discs are flat bilaterally.  Neck: The neck is supple, no carotid bruits are noted.  Respiratory: The respiratory examination is clear.  Cardiovascular: The cardiovascular examination reveals a regular rate and  rhythm, no obvious murmurs or rubs are noted.  Neuromuscular: The patient is able to flex fairly well with the low back, lacking only about 5 to 10 degrees of full flexion.  Skin: Extremities are without significant edema.  Neurologic Exam  Mental status: The patient is alert and oriented x 3 at the time of the examination. The patient has apparent normal recent and remote memory, with an apparently normal attention span and concentration ability.  Cranial nerves: Facial symmetry is present. There is good sensation of the face to pinprick and soft touch bilaterally. The strength of the facial muscles and the muscles to head turning and shoulder shrug are normal bilaterally. Speech is well enunciated, no aphasia or dysarthria is noted. Extraocular movements are full. Visual fields are full. The tongue is midline, and the patient has symmetric elevation of the soft palate. No obvious hearing deficits are noted.  Motor: The motor testing reveals 5 over 5 strength of all 4 extremities, with exception of some giveaway type weakness of the right leg with hip flexion. Good symmetric motor tone is noted throughout.  Sensory: Sensory testing is intact to pinprick, soft touch, vibration sensation, and position sense on all 4 extremities. No evidence of extinction is noted.  Coordination: Cerebellar testing reveals good finger-nose-finger and heel-to-shin bilaterally.  Gait and station: Gait is normal. Tandem gait is normal. Romberg is negative. No drift is seen.  The patient is able to walk on the heels and the toes bilaterally.  Reflexes: Deep tendon reflexes are symmetric and normal bilaterally. Toes are downgoing bilaterally.   Assessment/Plan:  1.  Right-sided sciatica  The patient currently is pregnant, we will be unable to perform x-ray or MRI evaluation of the low back.  The patient will be placed on amitriptyline working up slowly on the dose.  We could potentially add gabapentin in the  future.  The patient was set up for nerve conduction studies on both legs and EMG on the right leg.  She will follow-up otherwise in 3 months.  04/07/2019 MD 09/24/2019 10:15 AM  [facility] [address] [place], [place]  Phone [phone] Fax [phone]

## 2019-10-13 ENCOUNTER — Ambulatory Visit: Payer: Medicaid Other | Admitting: Neurology

## 2019-10-18 ENCOUNTER — Ambulatory Visit: Payer: Medicaid Other | Admitting: Neurology

## 2019-10-28 ENCOUNTER — Telehealth: Payer: Self-pay | Admitting: General Practice

## 2019-10-28 ENCOUNTER — Other Ambulatory Visit: Payer: Self-pay

## 2019-10-28 ENCOUNTER — Ambulatory Visit (INDEPENDENT_AMBULATORY_CARE_PROVIDER_SITE_OTHER): Payer: Medicaid Other | Admitting: Obstetrics and Gynecology

## 2019-10-28 VITALS — BP 106/62 | HR 78 | Wt 153.2 lb

## 2019-10-28 DIAGNOSIS — Z23 Encounter for immunization: Secondary | ICD-10-CM | POA: Diagnosis not present

## 2019-10-28 DIAGNOSIS — Z3402 Encounter for supervision of normal first pregnancy, second trimester: Secondary | ICD-10-CM

## 2019-10-28 DIAGNOSIS — Z3A29 29 weeks gestation of pregnancy: Secondary | ICD-10-CM

## 2019-10-28 DIAGNOSIS — Z789 Other specified health status: Secondary | ICD-10-CM | POA: Insufficient documentation

## 2019-10-28 DIAGNOSIS — Z3400 Encounter for supervision of normal first pregnancy, unspecified trimester: Secondary | ICD-10-CM

## 2019-10-28 DIAGNOSIS — K219 Gastro-esophageal reflux disease without esophagitis: Secondary | ICD-10-CM | POA: Insufficient documentation

## 2019-10-28 DIAGNOSIS — O99613 Diseases of the digestive system complicating pregnancy, third trimester: Secondary | ICD-10-CM

## 2019-10-28 MED ORDER — PANTOPRAZOLE SODIUM 40 MG PO TBEC: 40.0000 mg | DELAYED_RELEASE_TABLET | Freq: Every day | ORAL | 3 refills | Status: DC

## 2019-10-28 NOTE — Telephone Encounter (Signed)
Patient refused interpreter while checking in and visit with provider.

## 2019-10-28 NOTE — Patient Instructions (Signed)
Fetal Movement Counts Patient Name: ________________________________________________ Patient Due Date: ____________________ What is a fetal movement count?  A fetal movement count is the number of times that you feel your baby move during a certain amount of time. This may also be called a fetal kick count. A fetal movement count is recommended for every pregnant woman. You may be asked to start counting fetal movements as early as week 28 of your pregnancy. Pay attention to when your baby is most active. You may notice your baby's sleep and wake cycles. You may also notice things that make your baby move more. You should do a fetal movement count:  When your baby is normally most active.  At the same time each day. A good time to count movements is while you are resting, after having something to eat and drink. How do I count fetal movements? 1. Find a quiet, comfortable area. Sit, or lie down on your side. 2. Write down the date, the start time and stop time, and the number of movements that you felt between those two times. Take this information with you to your health care visits. 3. Write down your start time when you feel the first movement. 4. Count kicks, flutters, swishes, rolls, and jabs. You should feel at least 10 movements. 5. You may stop counting after you have felt 10 movements, or if you have been counting for 2 hours. Write down the stop time. 6. If you do not feel 10 movements in 2 hours, contact your health care provider for further instructions. Your health care provider may want to do additional tests to assess your baby's well-being. Contact a health care provider if:  You feel fewer than 10 movements in 2 hours.  Your baby is not moving like he or she usually does. Date: ____________ Start time: ____________ Stop time: ____________ Movements: ____________ Date: ____________ Start time: ____________ Stop time: ____________ Movements: ____________ Date: ____________  Start time: ____________ Stop time: ____________ Movements: ____________ Date: ____________ Start time: ____________ Stop time: ____________ Movements: ____________ Date: ____________ Start time: ____________ Stop time: ____________ Movements: ____________ Date: ____________ Start time: ____________ Stop time: ____________ Movements: ____________ Date: ____________ Start time: ____________ Stop time: ____________ Movements: ____________ Date: ____________ Start time: ____________ Stop time: ____________ Movements: ____________ Date: ____________ Start time: ____________ Stop time: ____________ Movements: ____________ This information is not intended to replace advice given to you by your health care provider. Make sure you discuss any questions you have with your health care provider. Document Revised: 10/22/2018 Document Reviewed: 10/22/2018 Elsevier Patient Education  2020 [employer] Inc[employer] Iron-Rich Diet  Iron is a mineral that helps your body to produce hemoglobin. Hemoglobin is a protein in red blood cells that carries oxygen to your body's tissues. Eating too little iron may cause you to feel weak and tired, and it can increase your risk of infection. Iron is naturally found in many foods, and many foods have iron added to them (iron-fortified foods). You may need to follow an iron-rich diet if you do not have enough iron in your body due to certain medical conditions. The amount of iron that you need each day depends on your age, your sex, and any medical conditions you have. Follow instructions from your health care provider or a diet and nutrition specialist (dietitian) about how much iron you should eat each day. What are tips for following this plan? Reading food labels  Check food labels to see how many milligrams (mg) of iron are in each   serving. Cooking  Cook foods in pots and pans that are made from iron.  Take these steps to make it easier for your body to absorb iron from certain  foods: ? Soak beans overnight before cooking. ? Soak whole grains overnight and drain them before using. ? Ferment flours before baking, such as by using yeast in bread dough. Meal planning  When you eat foods that contain iron, you should eat them with foods that are high in vitamin C. These include oranges, peppers, tomatoes, potatoes, and mango. Vitamin C helps your body to absorb iron. General information  Take iron supplements only as told by your health care provider. An overdose of iron can be life-threatening. If you were prescribed iron supplements, take them with orange juice or a vitamin C supplement.  When you eat iron-fortified foods or take an iron supplement, you should also eat foods that naturally contain iron, such as meat, poultry, and fish. Eating naturally iron-rich foods helps your body to absorb the iron that is added to other foods or contained in a supplement.  Certain foods and drinks prevent your body from absorbing iron properly. Avoid eating these foods in the same meal as iron-rich foods or with iron supplements. These foods include: ? Coffee, black tea, and red wine. ? Milk, dairy products, and foods that are high in calcium. ? Beans and soybeans. ? Whole grains. What foods should I eat? Fruits Prunes. Raisins. Eat fruits high in vitamin C, such as oranges, grapefruits, and strawberries, alongside iron-rich foods. Vegetables Spinach (cooked). Green peas. Broccoli. Fermented vegetables. Eat vegetables high in vitamin C, such as leafy greens, potatoes, bell peppers, and tomatoes, alongside iron-rich foods. Grains Iron-fortified breakfast cereal. Iron-fortified whole-wheat bread. Enriched rice. Sprouted grains. Meats and other proteins Beef liver. Oysters. Beef. Shrimp. Turkey. Chicken. Tuna. Sardines. Chickpeas. Nuts. Tofu. Pumpkin seeds. Beverages Tomato juice. Fresh orange juice. Prune juice. Hibiscus tea. Fortified instant breakfast shakes. Sweets and  desserts Blackstrap molasses. Seasonings and condiments Tahini. Fermented soy sauce. Other foods Wheat germ. The items listed above may not be a complete list of recommended foods and beverages. Contact a dietitian for more information. What foods should I avoid? Grains Whole grains. Bran cereal. Bran flour. Oats. Meats and other proteins Soybeans. Products made from soy protein. Black beans. Lentils. Mung beans. Split peas. Dairy Milk. Cream. Cheese. Yogurt. Cottage cheese. Beverages Coffee. Black tea. Red wine. Sweets and desserts Cocoa. Chocolate. Ice cream. Other foods Basil. Oregano. Large amounts of parsley. The items listed above may not be a complete list of foods and beverages to avoid. Contact a dietitian for more information. Summary  Iron is a mineral that helps your body to produce hemoglobin. Hemoglobin is a protein in red blood cells that carries oxygen to your body's tissues.  Iron is naturally found in many foods, and many foods have iron added to them (iron-fortified foods).  When you eat foods that contain iron, you should eat them with foods that are high in vitamin C. Vitamin C helps your body to absorb iron.  Certain foods and drinks prevent your body from absorbing iron properly, such as whole grains and dairy products. You should avoid eating these foods in the same meal as iron-rich foods or with iron supplements. This information is not intended to replace advice given to you by your health care provider. Make sure you discuss any questions you have with your health care provider. Document Revised: 02/14/2017 Document Reviewed: 01/28/2017 Elsevier Patient Education  2020 Elsevier   Inc.  

## 2019-10-28 NOTE — Progress Notes (Signed)
   LOW-RISK PREGNANCY OFFICE VISIT Patient name: Carol Rangel MRN [id number]  Date of birth: [birth year] Chief Complaint:   Routine Prenatal Visit  History of Present Illness:   Carol Rangel is a 34 y.o. G1P0 female at [redacted]w[redacted]d with an Estimated Date of Delivery: 01/10/20 being seen today for ongoing management of a low-risk pregnancy.  Today she reports no complaints. Contractions: Not present.  .  Movement: Present. denies leaking of fluid. Review of Systems:   Pertinent items are noted in HPI Denies abnormal vaginal discharge w/ itching/odor/irritation, headaches, visual changes, shortness of breath, chest pain, abdominal pain, severe nausea/vomiting, or problems with urination or bowel movements unless otherwise stated above. Pertinent History Reviewed:  Reviewed past medical,surgical, social, obstetrical and family history.  Reviewed problem list, medications and allergies. Physical Assessment:   Vitals:   10/28/19 0815  BP: 106/62  Pulse: 78  Weight: 153 lb 3.2 oz (69.5 kg)  Body mass index is 25.49 kg/m.        Physical Examination:   General appearance: Well appearing, and in no distress  Mental status: Alert, oriented to person, place, and time  Skin: Warm & dry  Cardiovascular: Normal heart rate noted  Respiratory: Normal respiratory effort, no distress  Abdomen: Soft, gravid, nontender  Pelvic: Cervical exam deferred         Extremities: Edema: Trace  Fetal Status: Fetal Heart Rate (bpm): 122   Movement: Present    No results found for this or any previous visit (from the past 24 hour(s)).  Assessment & Plan:  1) Low-risk pregnancy G1P0 at [redacted]w[redacted]d with an Estimated Date of Delivery: 01/10/20   2) Encounter for supervision of normal first pregnancy in second trimester - RPR - HIV Antibody (routine testing w rflx) - CBC - Glucose Tolerance, 2 Hours w/1 Hour  3) [redacted] weeks gestation of pregnancy  4) Language barrier affecting health care - Patient offered use of the [employer]  Language Services Video Interpreter -- Patient DECLINED  5) Gastroesophageal reflux during pregnancy in third trimester, antepartum - Rx for Protonix 40 mg daily sent    Meds: No orders of the defined types were placed in this encounter.  Labs/procedures today: 2 hr GTT, 3rd trimester labs, TdaP  Plan:  Continue routine obstetrical care   Reviewed: Preterm labor symptoms and general obstetric precautions including but not limited to vaginal bleeding, contractions, leaking of fluid and fetal movement were reviewed in detail with the patient.  All questions were answered.   Follow-up: Return in about 4 weeks (around 11/25/2019) for Return OB visit.  No orders of the defined types were placed in this encounter.  Raelyn Mora MSN, CNM 10/28/2019 8:17 AM

## 2019-10-29 ENCOUNTER — Encounter: Payer: Self-pay | Admitting: General Practice

## 2019-10-29 ENCOUNTER — Encounter: Payer: Self-pay | Admitting: Obstetrics and Gynecology

## 2019-10-29 ENCOUNTER — Other Ambulatory Visit: Payer: Self-pay | Admitting: Obstetrics and Gynecology

## 2019-10-29 DIAGNOSIS — O24410 Gestational diabetes mellitus in pregnancy, diet controlled: Secondary | ICD-10-CM | POA: Insufficient documentation

## 2019-10-29 LAB — CBC
Hematocrit: 34.4 % (ref 34.0–46.6)
Hemoglobin: 11.4 g/dL (ref 11.1–15.9)
MCH: 31.2 pg (ref 26.6–33.0)
MCHC: 33.1 g/dL (ref 31.5–35.7)
MCV: 94 fL (ref 79–97)
Platelets: 149 10*3/uL — ABNORMAL LOW (ref 150–450)
RBC: 3.65 x10E6/uL — ABNORMAL LOW (ref 3.77–5.28)
RDW: 11.9 % (ref 11.7–15.4)
WBC: 11.7 10*3/uL — ABNORMAL HIGH (ref 3.4–10.8)

## 2019-10-29 LAB — GLUCOSE TOLERANCE, 2 HOURS W/ 1HR
Glucose, 1 hour: 184 mg/dL — ABNORMAL HIGH (ref 65–179)
Glucose, 2 hour: 129 mg/dL (ref 65–152)
Glucose, Fasting: 80 mg/dL (ref 65–91)

## 2019-10-29 LAB — HIV ANTIBODY (ROUTINE TESTING W REFLEX): HIV Screen 4th Generation wRfx: NONREACTIVE

## 2019-10-29 LAB — RPR: RPR Ser Ql: NONREACTIVE

## 2019-10-29 MED ORDER — ACCU-CHEK GUIDE W/DEVICE KIT: 1.0000 | PACK | Freq: Four times a day (QID) | 0 refills | Status: DC

## 2019-10-29 MED ORDER — ACCU-CHEK SOFTCLIX LANCETS MISC: 12 refills | Status: DC

## 2019-11-01 ENCOUNTER — Encounter: Payer: Self-pay | Admitting: Neurology

## 2019-11-01 ENCOUNTER — Ambulatory Visit (INDEPENDENT_AMBULATORY_CARE_PROVIDER_SITE_OTHER): Payer: Medicaid Other | Admitting: Neurology

## 2019-11-01 ENCOUNTER — Ambulatory Visit: Payer: Medicaid Other | Admitting: Neurology

## 2019-11-01 DIAGNOSIS — M5431 Sciatica, right side: Secondary | ICD-10-CM

## 2019-11-01 NOTE — Procedures (Signed)
° ° ° °  HISTORY:  Carol Rangel is a 34 year old [place] female with a history of sciatica type pain on the right leg.  She is currently pregnant, but the pain began prior to pregnancy.  She is being evaluated for this discomfort.  NERVE CONDUCTION STUDIES:  Nerve conduction studies were performed on both lower extremities. The distal motor latencies and motor amplitudes for the peroneal and posterior tibial nerves were within normal limits. The nerve conduction velocities for these nerves were also normal. The sensory latencies for the peroneal and sural nerves were within normal limits. The F wave latencies for the posterior tibial nerves were within normal limits.   EMG STUDIES:  EMG study was performed on the right lower extremity:  The tibialis anterior muscle reveals 2 to 4K motor units with full recruitment. No fibrillations or positive waves were seen. The peroneus tertius muscle reveals 2 to 4K motor units with full recruitment. No fibrillations or positive waves were seen. The medial gastrocnemius muscle reveals 1 to 3K motor units with full recruitment. No fibrillations or positive waves were seen. The vastus lateralis muscle reveals 2 to 4K motor units with full recruitment. No fibrillations or positive waves were seen. The iliopsoas muscle reveals 2 to 4K motor units with full recruitment. No fibrillations or positive waves were seen. The biceps femoris muscle (long head) reveals 2 to 4K motor units with full recruitment. No fibrillations or positive waves were seen. The lumbosacral paraspinal muscles were tested at 3 levels, and revealed no abnormalities of insertional activity at all 3 levels tested. There was good relaxation.   IMPRESSION:  Nerve conduction studies were performed on both lower extremities and were unremarkable, no evidence of a neuropathy was seen.  EMG evaluation of the right lower extremity was unremarkable without evidence of an overlying lumbosacral  radiculopathy.  Marlan Palau MD 11/01/2019 2:55 PM  [facility] [address] [place], [place] [address]  Phone [phone] Fax [phone]

## 2019-11-01 NOTE — Progress Notes (Signed)
Please refer to EMG and nerve conduction procedure note.  

## 2019-11-01 NOTE — Progress Notes (Addendum)
The patient comes in for EMG and nerve conduction study evaluation.  She has right-sided sciatica, she is currently pregnant.  She has been on amitriptyline taking only 10 mg at night, claims it does not work.  She will go to 20 mg at night for a week and then go to 30 mg at night.  Nerve conduction studies on both legs were unremarkable.  EMG on the right leg was normal.      MNC    Nerve / Sites Muscle Latency Ref. Amplitude Ref. Rel Amp Segments Distance Velocity Ref. Area    ms ms mV mV %  cm m/s m/s mVms  R Peroneal - EDB     Ankle EDB 3.6 ?6.5 5.1 ?2.0 100 Ankle - EDB 9   14.4     Fib head EDB 8.5  4.7  90.9 Fib head - Ankle 26 54 ?44 14.0     Pop fossa EDB 10.4  4.5  95.7 Pop fossa - Fib head 10 54 ?44 13.9         Pop fossa - Ankle      L Peroneal - EDB     Ankle EDB 3.7 ?6.5 4.5 ?2.0 100 Ankle - EDB 9   12.2     Fib head EDB 8.7  3.9  86.1 Fib head - Ankle 26 52 ?44 10.7     Pop fossa EDB 10.6  4.1  105 Pop fossa - Fib head 10 52 ?44 11.1         Pop fossa - Ankle      R Tibial - AH     Ankle AH 3.3 ?5.8 16.4 ?4.0 100 Ankle - AH 9   29.3     Pop fossa AH 10.3  12.3  75.2 Pop fossa - Ankle 36 51 ?41 26.6  L Tibial - AH     Ankle AH 3.1 ?5.8 16.0 ?4.0 100 Ankle - AH 9   27.9     Pop fossa AH 10.1  12.3  77.2 Pop fossa - Ankle 34 49 ?41 25.4             SNC    Nerve / Sites Rec. Site Peak Lat Ref.  Amp Ref. Segments Distance    ms ms V V  cm  R Sural - Ankle (Calf)     Calf Ankle 3.1 ?4.4 17 ?6 Calf - Ankle 14  L Sural - Ankle (Calf)     Calf Ankle 3.0 ?4.4 11 ?6 Calf - Ankle 14  R Superficial peroneal - Ankle     Lat leg Ankle 3.4 ?4.4 7 ?6 Lat leg - Ankle 14  L Superficial peroneal - Ankle     Lat leg Ankle 3.3 ?4.4 8 ?6 Lat leg - Ankle 14             F  Wave    Nerve F Lat Ref.   ms ms  R Tibial - AH 44.6 ?56.0  L Tibial - AH 44.1 ?56.0

## 2019-11-10 ENCOUNTER — Encounter: Payer: Self-pay | Admitting: Dietician

## 2019-11-10 ENCOUNTER — Encounter: Payer: Medicaid Other | Attending: Obstetrics and Gynecology | Admitting: Dietician

## 2019-11-10 ENCOUNTER — Other Ambulatory Visit: Payer: Self-pay

## 2019-11-10 DIAGNOSIS — O24410 Gestational diabetes mellitus in pregnancy, diet controlled: Secondary | ICD-10-CM | POA: Diagnosis not present

## 2019-11-11 NOTE — Progress Notes (Signed)
Patient was seen on 11/10/2019 for Gestational Diabetes Self-Management class at the [facility] office. The following learning objectives were met by the patient during this course:   States the definition of Gestational Diabetes  States why dietary management is important in controlling blood glucose  Describes the effects each nutrient has on blood glucose levels  Demonstrates ability to create a balanced meal plan  Demonstrates carbohydrate counting   States when to check blood glucose levels  Demonstrates proper blood glucose monitoring techniques  States the effect of stress and exercise on blood glucose levels  States the importance of limiting caffeine and abstaining from alcohol and smoking  Blood glucose monitor given: Accu-Chek Guide Me Lot # [id number] Exp: 12/07/20 Blood glucose reading: 140  Patient instructed to monitor glucose levels: FBS: 60 - <95; 1 hour: <140; 2 hour: <120  Patient received handouts:  Nutrition Diabetes and Pregnancy  Patient will be seen for follow-up as needed.  

## 2019-11-18 ENCOUNTER — Other Ambulatory Visit: Payer: Self-pay

## 2019-11-18 ENCOUNTER — Ambulatory Visit: Payer: Medicaid Other | Attending: Obstetrics and Gynecology

## 2019-11-18 ENCOUNTER — Ambulatory Visit: Payer: Medicaid Other | Admitting: *Deleted

## 2019-11-18 DIAGNOSIS — O24410 Gestational diabetes mellitus in pregnancy, diet controlled: Secondary | ICD-10-CM | POA: Insufficient documentation

## 2019-11-18 DIAGNOSIS — Z3400 Encounter for supervision of normal first pregnancy, unspecified trimester: Secondary | ICD-10-CM | POA: Diagnosis present

## 2019-11-18 DIAGNOSIS — Z362 Encounter for other antenatal screening follow-up: Secondary | ICD-10-CM | POA: Diagnosis not present

## 2019-11-18 DIAGNOSIS — O283 Abnormal ultrasonic finding on antenatal screening of mother: Secondary | ICD-10-CM

## 2019-11-18 DIAGNOSIS — Z3A32 32 weeks gestation of pregnancy: Secondary | ICD-10-CM

## 2019-11-18 DIAGNOSIS — O09513 Supervision of elderly primigravida, third trimester: Secondary | ICD-10-CM

## 2019-11-18 DIAGNOSIS — O0930 Supervision of pregnancy with insufficient antenatal care, unspecified trimester: Secondary | ICD-10-CM

## 2019-11-18 DIAGNOSIS — O0933 Supervision of pregnancy with insufficient antenatal care, third trimester: Secondary | ICD-10-CM

## 2019-11-18 DIAGNOSIS — O09519 Supervision of elderly primigravida, unspecified trimester: Secondary | ICD-10-CM

## 2019-11-18 IMAGING — US US MFM OB FOLLOW-UP
1 series · 13 of 28 positions shown · non-contrast
Comparison: none

[Series 1: us mfm ob follow-up · 32 acquisitions, 13 frames shown]
[im 2/32]
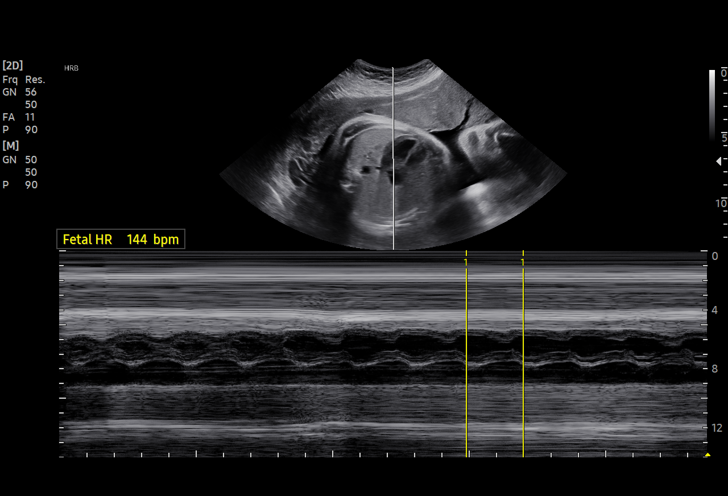
[im 4/32]
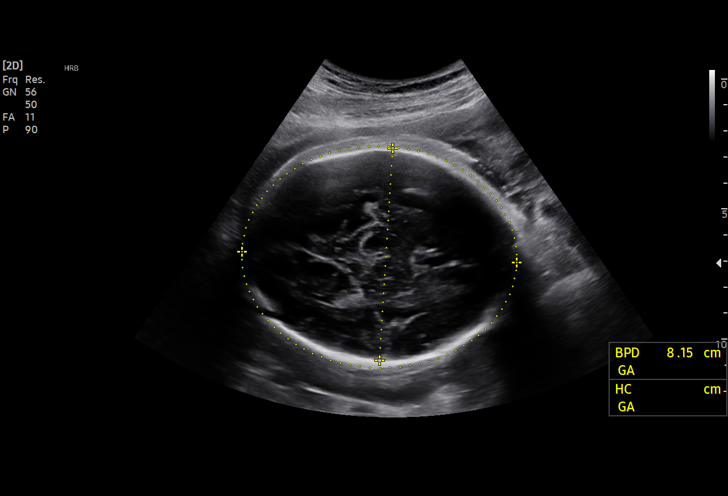
[im 6/32]
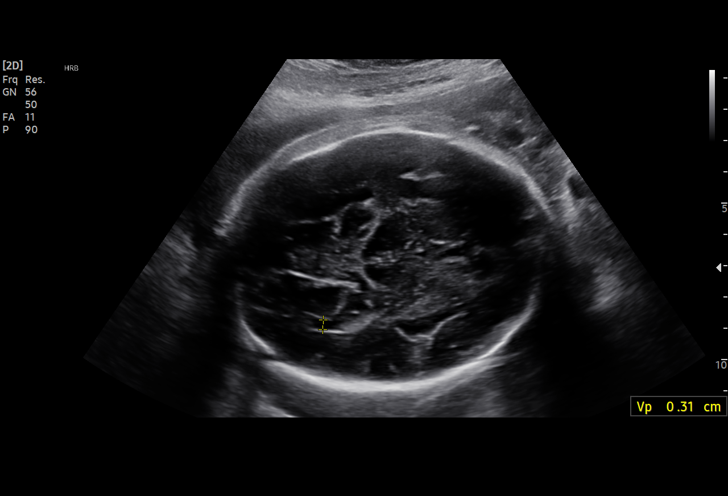
[im 9/32]
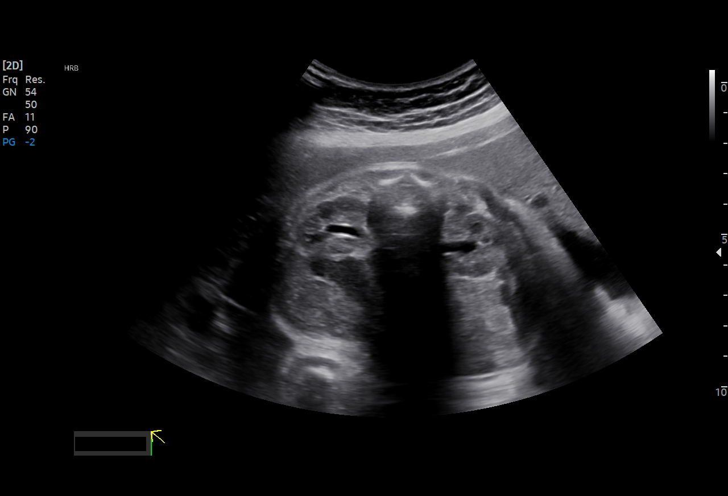
[im 11/32]
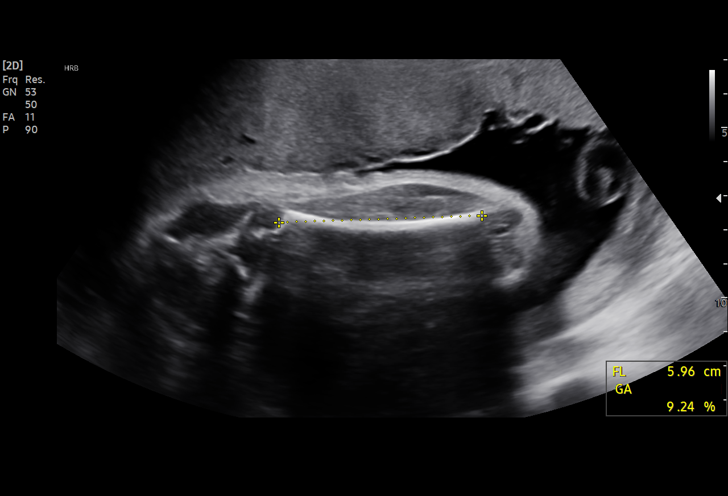
[im 13/32]
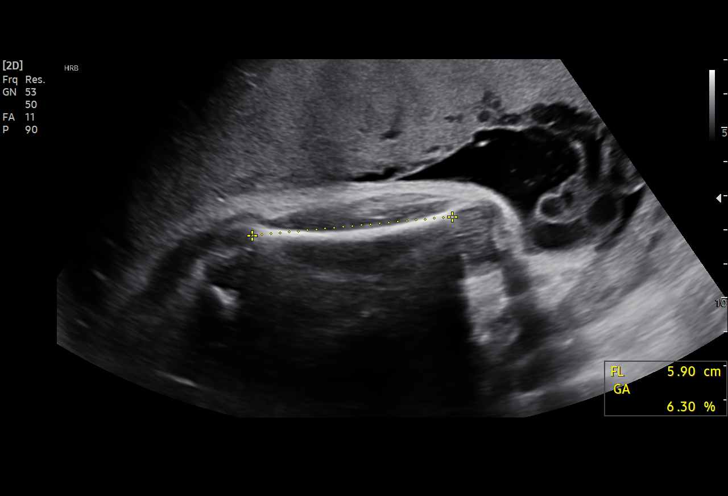
[im 17/32]
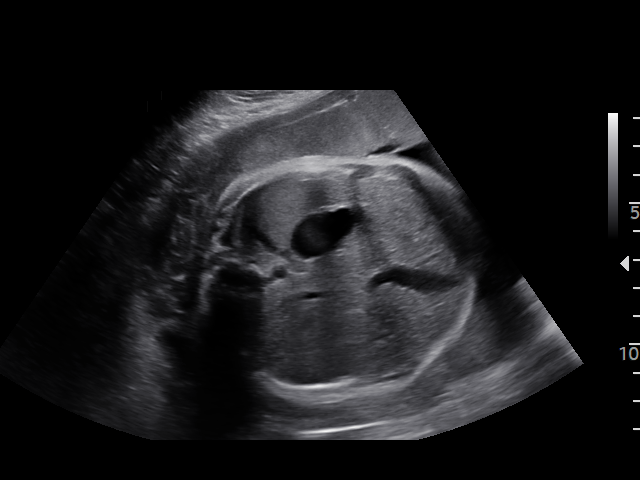
[im 19/32]
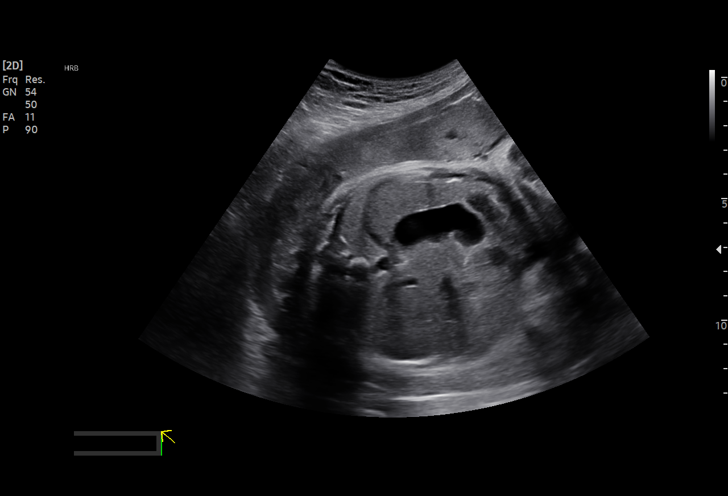
[im 21/32]
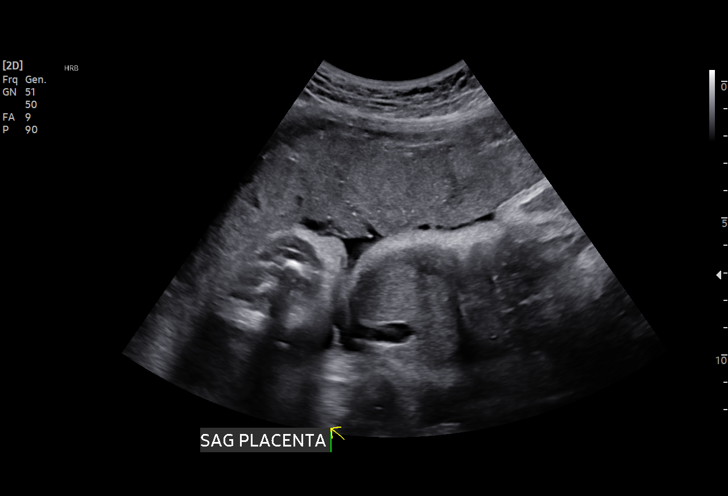
[im 23/32]
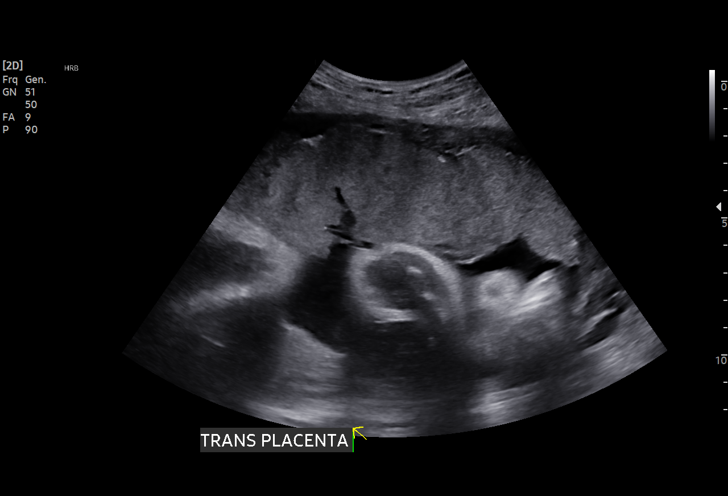
[im 26/32]
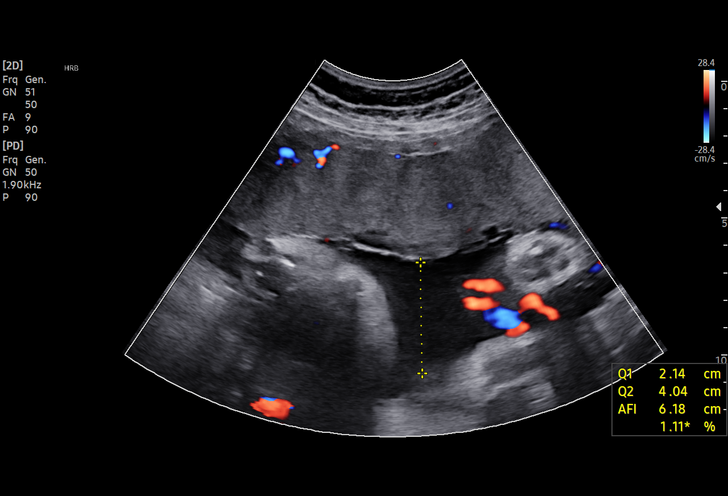
[im 28/32]
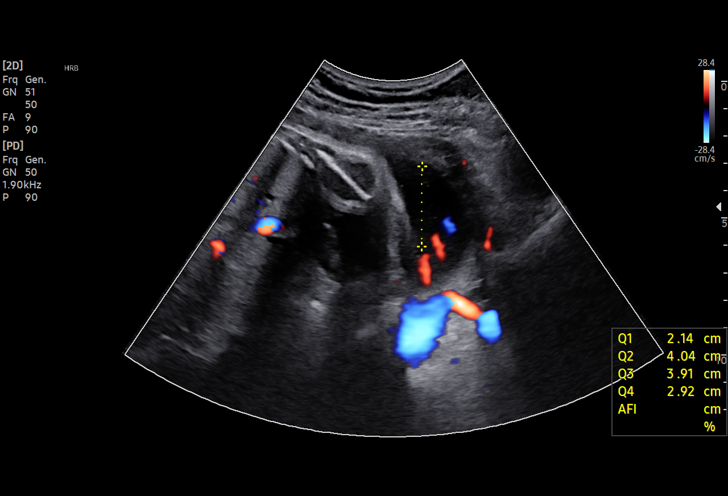
[im 30/32]
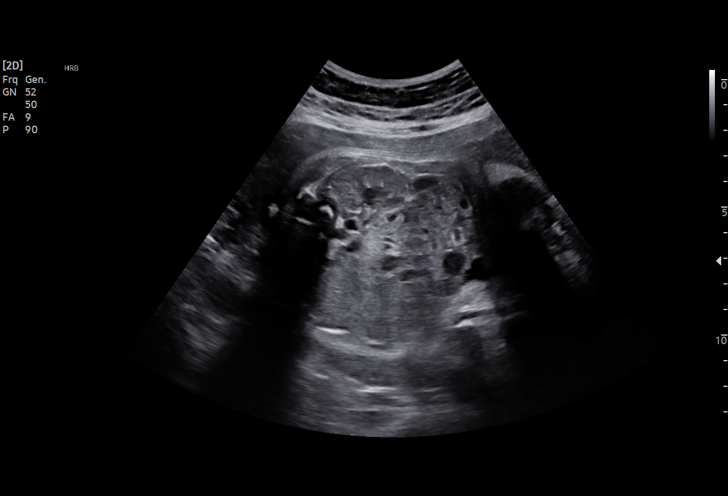

[13 of 28 positions shown; findings below may reference images not displayed]

Indications

 Pyelectasis of fetus on prenatal ultrasound    [2B]
 Encounter for other antenatal screening        [2B]
 follow-up
 Advanced maternal age primigravida 35+,        [2B]
 second trimester
 Late prenatal care, second trimester           [2B]
 32 weeks gestation of pregnancy
Vital Signs

                                                Height:        5'5"
Fetal Evaluation

 Num Of Fetuses:         1
 Fetal Heart Rate(bpm):  144
 Cardiac Activity:       Observed
 Presentation:           Cephalic
 Placenta:               Anterior
 P. Cord Insertion:      Previously Visualized

 Amniotic Fluid
 AFI FV:      Within normal limits

 AFI Sum(cm)     %Tile       Largest Pocket(cm)
 12.9            39          4

 RUQ(cm)       RLQ(cm)       LUQ(cm)        LLQ(cm)
 2.1           2.9           4
Biometry
 BPD:      81.7  mm     G. Age:  32w 6d         55  %    CI:        75.39   %    70 - 86
                                                         FL/HC:      19.8   %    19.1 -
 HC:      298.4  mm     G. Age:  33w 0d         28  %    HC/AC:      1.00        0.96 -
 AC:      298.8  mm     G. Age:  33w 6d         86  %    FL/BPD:     72.5   %    71 - 87
 FL:       59.2  mm     G. Age:  30w 6d          7  %    FL/AC:      19.8   %    20 - 24

 LV:        3.1  mm

 Est. FW:    [2B]  gm      4 lb 9 oz     53  %
OB History

 Maternal Racial/Ethnic Group:   Asian
 Gravidity:    1         Term:   0        Prem:   0        SAB:   0
 TOP:          0       Ectopic:  0        Living: 0
Gestational Age

 LMP:           32w 3d        Date:  [DATE]                 EDD:   [DATE]
 U/S Today:     32w 5d                                        EDD:   [DATE]
 Best:          32w 3d     Det. By:  LMP  ([DATE])          EDD:   [DATE]
Anatomy

 Cranium:               Appears normal         Aortic Arch:            Previously seen
 Cavum:                 Appears normal         Ductal Arch:            Previously seen
 Ventricles:            Appears normal         Diaphragm:              Appears normal
 Choroid Plexus:        Previously seen        Stomach:                Appears normal, left
                                                                       sided
 Cerebellum:            Previously seen        Abdomen:                Appears normal
 Posterior Fossa:       Previously seen        Abdominal Wall:         Previously seen
 Nuchal Fold:           Not applicable (>20    Cord Vessels:           Previously seen
                        wks GA)
 Face:                  Orbits and profile     Kidneys:                Appear normal
                        previously seen
 Lips:                  Previously seen        Bladder:                Appears normal
 Thoracic:              Appears normal         Spine:                  Previously seen
 Heart:                 Previously seen        Upper Extremities:      Previously seen
 RVOT:                  Previously seen        Lower Extremities:      Previously seen
 LVOT:                  Previously seen

 Other:  Heels visualized. Nasal bone visualized. Technically difficult due to
         fetal position.
Cervix Uterus Adnexa

 Cervix
 Not visualized (advanced GA >[2B])
Comments

 This patient was seen for a follow up growth scan due to mild
 bilateral pyelectasis that was noted during her prior
 ultrasound exam.  She reports that she was recently
 diagnosed with diet-controlled gestational diabetes.
 She was informed that the fetal growth and amniotic fluid
 level appears appropriate for her gestational age.
 The previously noted bilateral pyelectasis appears to have
 resolved on today's exam.
 Due to gestational diabetes, follow-up growth scan was
 scheduled in 4 weeks.  She should be referred back for
 weekly fetal testing should she require Metformin or insulin
 for treatment of gestational diabetes.

## 2019-11-19 ENCOUNTER — Other Ambulatory Visit: Payer: Self-pay | Admitting: *Deleted

## 2019-11-19 DIAGNOSIS — O24419 Gestational diabetes mellitus in pregnancy, unspecified control: Secondary | ICD-10-CM

## 2019-11-24 ENCOUNTER — Encounter: Payer: Self-pay | Admitting: Student

## 2019-11-24 ENCOUNTER — Other Ambulatory Visit: Payer: Self-pay

## 2019-11-24 ENCOUNTER — Ambulatory Visit (INDEPENDENT_AMBULATORY_CARE_PROVIDER_SITE_OTHER): Payer: Medicaid Other | Admitting: Student

## 2019-11-24 VITALS — BP 101/66 | HR 96 | Temp 98.4°F | Wt 154.8 lb

## 2019-11-24 DIAGNOSIS — O99113 Other diseases of the blood and blood-forming organs and certain disorders involving the immune mechanism complicating pregnancy, third trimester: Secondary | ICD-10-CM

## 2019-11-24 DIAGNOSIS — Z3A33 33 weeks gestation of pregnancy: Secondary | ICD-10-CM

## 2019-11-24 DIAGNOSIS — R102 Pelvic and perineal pain: Secondary | ICD-10-CM

## 2019-11-24 DIAGNOSIS — D696 Thrombocytopenia, unspecified: Secondary | ICD-10-CM

## 2019-11-24 DIAGNOSIS — O26893 Other specified pregnancy related conditions, third trimester: Secondary | ICD-10-CM

## 2019-11-24 DIAGNOSIS — O24410 Gestational diabetes mellitus in pregnancy, diet controlled: Secondary | ICD-10-CM

## 2019-11-24 DIAGNOSIS — Z3400 Encounter for supervision of normal first pregnancy, unspecified trimester: Secondary | ICD-10-CM

## 2019-11-24 MED ORDER — COMFORT FIT MATERNITY SUPP SM MISC: 1.0000 [IU] | Freq: Every day | 0 refills | Status: DC | PRN

## 2019-11-24 MED ORDER — ACCU-CHEK GUIDE VI STRP: ORAL_STRIP | 3 refills | Status: DC

## 2019-11-24 NOTE — Patient Instructions (Addendum)
PREGNANCY SUPPORT BELT: You are not alone, Seventy-five percent of women have some sort of abdominal or back pain at some point in their pregnancy. Your baby is growing at a fast pace, which means that your whole body is rapidly trying to adjust to the changes. As your uterus grows, your back may start feeling a bit under stress and this can result in back or abdominal pain that can go from mild, and therefore bearable, to severe pains that will not allow you to sit or lay down comfortably, When it comes to dealing with pregnancy-related pains and cramps, some pregnant women usually prefer natural remedies, which the market is filled with nowadays. For example, wearing a pregnancy support belt can help ease and lessen your discomfort and pain. WHAT ARE THE BENEFITS OF WEARING A PREGNANCY SUPPORT BELT? A pregnancy support belt provides support to the lower portion of the belly taking some of the weight of the growing uterus and distributing to the other parts of your body. It is designed make you comfortable and gives you extra support. Over the years, the pregnancy apparel market has been studying the needs and wants of pregnant women and they have come up with the most comfortable pregnancy support belts that woman could ever ask for. In fact, you will no longer have to wear a stretched-out or bulky pregnancy belt that is visible underneath your clothes and makes you feel even more uncomfortable. Nowadays, a pregnancy support belt is made of comfortable and stretchy materials that will not irritate your skin but will actually make you feel at ease and you will not even notice you are wearing it. They are easy to put on and adjust during the day and can be worn at night for additional support.  BENEFITS: . Relives Back pain . Relieves Abdominal Muscle and Leg Pain . Stabilizes the Pelvic Ring . Offers a Cushioned Abdominal Lift Pad . Relieves pressure on the Sciatic Nerve Within Minutes WHERE TO GET  YOUR PREGNANCY BELT: [employer] [phone] [phone]  [place], [place] [address]       Blood Glucose Monitoring, Adult Monitoring your blood sugar (glucose) is an important part of managing your diabetes (diabetes mellitus). Blood glucose monitoring involves checking your blood glucose as often as directed and keeping a record (log) of your results over time. Checking your blood glucose regularly and keeping a blood glucose log can:  Help you and your health care provider adjust your diabetes management plan as needed, including your medicines or insulin.  Help you understand how food, exercise, illnesses, and medicines affect your blood glucose.  Let you know what your blood glucose is at any time. You can quickly find out if you have low blood glucose (hypoglycemia) or high blood glucose (hyperglycemia). Your health care provider will set individualized treatment goals for you. Your goals will be based on your age, other medical conditions you have, and how you respond to diabetes treatment. Generally, the goal of treatment is to maintain the following blood glucose levels:  Before meals (preprandial): 80-130 mg/dL (4.4-7.2 mmol/L).  After meals (postprandial): below 180 mg/dL (10 mmol/L).  A1c level: less than 7%. Supplies needed:  Blood glucose meter.  Test strips for your meter. Each meter has its own strips. You must use the strips that came with your meter.  A needle to prick your finger (lancet). Do not use a lancet more than one time.  A device that holds the lancet (lancing  device).  A journal or log book to write down your results. How to check your blood glucose  1. Wash your hands with soap and water. 2. Prick the side of your finger (not the tip) with the lancet. Use a different finger each time. 3. Gently rub the finger until a small drop of blood appears. 4. Follow instructions that come with your meter for inserting the test strip,  applying blood to the strip, and using your blood glucose meter. 5. Write down your result and any notes. Some meters allow you to use areas of your body other than your finger (alternative sites) to test your blood. The most common alternative sites are:  Forearm.  Thigh.  Palm of the hand. If you think you may have hypoglycemia, or if you have a history of not knowing when your blood glucose is getting low (hypoglycemia unawareness), do not use alternative sites. Use your finger instead. Alternative sites may not be as accurate as the fingers, because blood flow is slower in these areas. This means that the result you get may be delayed, and it may be different from the result that you would get from your finger. Follow these instructions at home: Blood glucose log   Every time you check your blood glucose, write down your result. Also write down any notes about things that may be affecting your blood glucose, such as your diet and exercise for the day. This information can help you and your health care provider: ? Look for patterns in your blood glucose over time. ? Adjust your diabetes management plan as needed.  Check if your meter allows you to download your records to a computer. Most glucose meters store a record of glucose readings in the meter. If you have type 1 diabetes:  Check your blood glucose 2 or more times a day.  Also check your blood glucose: ? Before every insulin injection. ? Before and after exercise. ? Before meals. ? 2 hours after a meal. ? Occasionally between 2:00 a.m. and 3:00 a.m., as directed. ? Before potentially dangerous tasks, like driving or using heavy machinery. ? At bedtime.  You may need to check your blood glucose more often, up to 6-10 times a day, if you: ? Use an insulin pump. ? Need multiple daily injections (MDI). ? Have diabetes that is not well-controlled. ? Are ill. ? Have a history of severe hypoglycemia. ? Have hypoglycemia  unawareness. If you have type 2 diabetes:  If you take insulin or other diabetes medicines, check your blood glucose 2 or more times a day.  If you are on intensive insulin therapy, check your blood glucose 4 or more times a day. Occasionally, you may also need to check between 2:00 a.m. and 3:00 a.m., as directed.  Also check your blood glucose: ? Before and after exercise. ? Before potentially dangerous tasks, like driving or using heavy machinery.  You may need to check your blood glucose more often if: ? Your medicine is being adjusted. ? Your diabetes is not well-controlled. ? You are ill. General tips  Always keep your supplies with you.  If you have questions or need help, all blood glucose meters have a 24-hour "hotline" phone number that you can call. You may also contact your health care provider.  After you use a few boxes of test strips, adjust (calibrate) your blood glucose meter by following instructions that came with your meter. Contact a health care provider if:  Your blood glucose  is at or above 240 mg/dL (22.9 mmol/L) for 2 days in a row.  You have been sick or have had a fever for 2 days or longer, and you are not getting better.  You have any of the following problems for more than 6 hours: ? You cannot eat or drink. ? You have nausea or vomiting. ? You have diarrhea. Get help right away if:  Your blood glucose is lower than 54 mg/dL (3 mmol/L).  You become confused or you have trouble thinking clearly.  You have difficulty breathing.  You have moderate or large ketone levels in your urine. Summary  Monitoring your blood sugar (glucose) is an important part of managing your diabetes (diabetes mellitus).  Blood glucose monitoring involves checking your blood glucose as often as directed and keeping a record (log) of your results over time.  Your health care provider will set individualized treatment goals for you. Your goals will be based on your  age, other medical conditions you have, and how you respond to diabetes treatment.  Every time you check your blood glucose, write down your result. Also write down any notes about things that may be affecting your blood glucose, such as your diet and exercise for the day. This information is not intended to replace advice given to you by your health care provider. Make sure you discuss any questions you have with your health care provider. Document Revised: 12/26/2017 Document Reviewed: 08/14/2015 Elsevier Patient Education  2020 [employer]. Gestational Diabetes Mellitus, Self Care When you have gestational diabetes (gestational diabetes mellitus), you must make sure your blood sugar (glucose) stays in a healthy range. You can do this with:  Nutrition.  Exercise.  Lifestyle changes.  Medicines or insulin, if needed.  Support from your doctors and others. If you get treated for this condition, it may not hurt you or your unborn baby (fetus). If you do not get treated for this condition, it may cause problems that can hurt you or your unborn baby. If you get gestational diabetes, you are:  More likely to get it if you get pregnant again.  More likely to develop type 2 diabetes in the future. How to stay aware of blood sugar   Check your blood sugar every day while you are pregnant. Check it as often as told.  Call your doctor if your blood sugar is above your goal numbers for two tests in a row. Your doctor will set personal treatment goals for you. Generally, you should have these blood sugar levels:  Before meals, or after not eating for a long time (fasting or preprandial): at or below 95 mg/dL (5.3 mmol/L).  After meals (postprandial): ? One hour after a meal: at or below 140 mg/dL (7.8 mmol/L). ? Two hours after a meal: at or below 120 mg/dL (6.7 mmol/L).  A1c (hemoglobin A1c) level: 6-6.5%. How to manage high and low blood sugar Signs of high blood sugar High blood  sugar is called hyperglycemia. Know the early signs of high blood sugar. Signs may include:  Feeling: ? Thirsty. ? Hungry. ? Very tired.  Needing to pee (urinate) more than usual.  Blurry vision. Signs of low blood sugar Low blood sugar is called hypoglycemia. This is when blood sugar is at or below 70 mg/dL (3.9 mmol/L). Signs may include:  Feeling: ? Hungry. ? Worried or nervous (anxious). ? Sweaty and clammy. ? Confused. ? Dizzy. ? Sleepy. ? Sick to your stomach (nauseous).  Having: ? A fast  heartbeat. ? A headache. ? A change in your vision. ? Tingling or no feeling (numbness) around your mouth, lips, or tongue. ? Jerky movements that you cannot control (seizure).  Having trouble with: ? Moving (coordination). ? Sleeping. ? Passing out (fainting). ? Getting upset easily (irritability). Treating low blood sugar To treat low blood sugar, eat or drink something sugary right away. If you can think clearly and swallow safely, follow the 15:15 rule:  Take 15 grams of a fast-acting carb (carbohydrate). Talk with your doctor about how much you should take.  Some fast-acting carbs are: ? Sugar tablets (glucose pills). Take 3-4 glucose pills. ? 6-8 pieces of hard candy. ? 4-6 oz (120-150 mL) of fruit juice. ? 4-6 oz (120-150 mL) of regular (not diet) soda. ? 1 Tbsp (15 mL) honey or sugar.  Check your blood sugar 15 minutes after you take the carb.  If your blood sugar is still at or below 70 mg/dL (3.9 mmol/L), take 15 grams of a carb again.  If your blood sugar does not go above 70 mg/dL (3.9 mmol/L) after 3 tries, get help right away.  After your blood sugar goes back to normal, eat a meal or a snack within 1 hour. Treating very low blood sugar If your blood sugar is at or below 54 mg/dL (3 mmol/L), you have very low blood sugar (severe hypoglycemia). This is an emergency. Do not wait to see if the symptoms will go away. Get medical help right away. Call your local  emergency services (911 in the [place] If you have very low blood sugar and you cannot eat or drink, you may need a glucagon shot (injection). A family member or friend should learn how to check your blood sugar and how to give you a glucagon shot. Ask your doctor if you need to have a glucagon shot kit at home. Follow these instructions at home: Medicine  Take your insulin and diabetes medicines as told.  If your doctor says you should take more or less insulin or medicines, do this exactly as told.  Do not run out of insulin or medicines. Food   Make healthy food choices. These include: ? Chicken, fish, egg whites, and beans. ? Oats, whole wheat, bulgur, brown rice, quinoa, and millet. ? Fresh fruits and vegetables. ? Low-fat dairy products. ? Nuts, avocado, olive oil, and canola oil.  Meet with a food specialist (dietitian). He or she can help you make an eating plan that is right for you.  Follow instructions from your doctor about what you cannot eat or drink.  Drink enough fluid to keep your pee (urine) pale yellow.  Eat healthy snacks between healthy meals.  Keep track of carbs that you eat. Do this by reading food labels and learning food serving sizes.  Follow your sick day plan when you cannot eat or drink normally. Make this plan with your doctor so it is ready to use. Activity  Exercise for 30 or more minutes a day, or as much as your doctor recommends.  Talk with your doctor before you start a new exercise or activity. Your doctor may need to tell you to change: ? How much insulin or medicines you take. ? How much food you eat. Lifestyle  Do not drink alcohol.  Do not use any tobacco products. These include cigarettes, chewing tobacco, and e-cigarettes. If you need help quitting, ask your doctor.  Learn how to deal with stress. If you need help with this, ask  your doctor. Body care  Stay up to date with your shots (immunizations).  Brush your teeth and  gums two times a day. Floss one or more times a day.  Go to the dentist one or more times every 6 months.  Stay at a healthy weight while you are pregnant. General instructions  Take over-the-counter and prescription medicines only as told by your doctor.  Ask your doctor about risks of high blood pressure in pregnancy (preeclampsia and eclampsia).  Share your diabetes care plan with: ? Your work or school. ? People you live with.  Check your pee for ketones: ? When you are sick. ? As told by your doctor.  Carry a card or wear jewelry that says you have diabetes.  Keep all follow-up visits as told by your doctor. This is important. Care after giving birth  Have your blood sugar checked 4-12 weeks after you give birth.  Get checked for diabetes one or more times during 3 years. Questions to ask your doctor  Do I need to meet with a diabetes educator?  Where can I find a support group for people with gestational diabetes? Where to find more information To learn more about diabetes, visit:  American Diabetes Association: www.diabetes.org  Centers for Disease Control and Prevention (CDC): http://www.wolf.info/ Summary  Check your blood sugar (glucose) every day while you are pregnant. Check it as often as told.  Take your insulin and diabetes medicines as told.  Keep all follow-up visits as told by your doctor. This is important.  Have your blood sugar checked 4-12 weeks after you give birth. This information is not intended to replace advice given to you by your health care provider. Make sure you discuss any questions you have with your health care provider. Document Revised: 08/25/2017 Document Reviewed: 04/07/2015 Elsevier Patient Education  2020 [employer].

## 2019-11-24 NOTE — Progress Notes (Signed)
Pt. Presents for ROB, [redacted]w[redacted]d. Pt. States "it is hard to walk"  Denies any other concerns at this time.

## 2019-11-24 NOTE — Progress Notes (Signed)
PRENATAL VISIT NOTE  Subjective:  Carol Rangel is a 34 y.o. G1P0 at [redacted]w[redacted]d being seen today for ongoing prenatal care.  She is currently monitored for the following issues for this high-risk pregnancy and has Malnutrition of moderate degree (HCC); Supervision of normal first pregnancy, antepartum; Late prenatal care; Advanced maternal age, 1st pregnancy; Sciatica of right side; Gastroesophageal reflux during pregnancy in third trimester, antepartum; Language barrier affecting health care; and Gestational diabetes mellitus, class A1 on their problem list.  Patient reports pelvic pain and continued leg pain. Is being followed by neurology for her sciatica & has appointment with the next week. Has been taking amitriptyline witout relief. Now has pain in the front of her pelvis when she walks and has difficulty lifting her legs due to pain. .  Contractions: Irritability. Vag. Bleeding: None.  Movement: Present. Denies leaking of fluid.   The following portions of the patient's history were reviewed and updated as appropriate: allergies, current medications, past family history, past medical history, past social history, past surgical history and problem list.   Objective:   Vitals:   11/24/19 1500  BP: 101/66  Pulse: 96  Temp: 98.4 F (36.9 C)  Weight: 154 lb 12.8 oz (70.2 kg)    Fetal Status: Fetal Heart Rate (bpm): 127   Movement: Present     General:  Alert, oriented and cooperative. Patient is in no acute distress.  Skin: Skin is warm and dry. No rash noted.   Cardiovascular: Normal heart rate noted  Respiratory: Normal respiratory effort, no problems with respiration noted  Abdomen: Soft, gravid, appropriate for gestational age.  Pain/Pressure: Present     Pelvic: Cervical exam deferred        Extremities: Normal range of motion.  Edema: Trace  Mental Status: Normal mood and affect. Normal behavior. Normal judgment and thought content.   Assessment and Plan:  Pregnancy: G1P0 at  [redacted]w[redacted]d 1. Supervision of normal first pregnancy, antepartum   2. Benign gestational thrombocytopenia in third trimester (HCC)  - CBC  3. Gestational diabetes mellitus, class A1 -Patient did not bring log with her. States she didn't know how frequently to check her blood sugars so has only been checking 1-2 times per day. States they have been 120-140. Is out of lancets. Did not know that prescription as sent in for her.  -Discussed importance of proper monitoring of CBGs. Should check fasting & 2 hours after main meals. Goal for PP is <120. Goal for fasting is <95. She did have appt with diabetes educator which indicates that these goals were given to her.  -Called pharmacy to ensure that she can get lancets today, also prescribed test strips for patient.  -Will bring back for in person visit in 1 week to review blood sugar log. Patient will bring log with her.   -Already has growth ultrasound scheduled at 3 wks  - glucose blood (ACCU-CHEK GUIDE) test strip; Use as instructed  Dispense: 100 each; Refill: 3  4. Pelvic pain affecting pregnancy in third trimester, antepartum -Encouraged to keep appointment with neurology. Will also prescribe maternity support belt & refer to physical therapy.   - Ambulatory referral to Physical Therapy - Elastic Bandages & Supports (COMFORT FIT MATERNITY SUPP SM) MISC; 1 Units by Does not apply route daily as needed.  Dispense: 1 each; Refill: 0  5. [redacted] weeks gestation of pregnancy   Preterm labor symptoms and general obstetric precautions including but not limited to vaginal bleeding, contractions, leaking of fluid and  fetal movement were reviewed in detail with the patient. Please refer to After Visit Summary for other counseling recommendations.   Return in about 1 week (around 12/01/2019) for in person to review blood sugar log.  Future Appointments  Date Time Provider Department Center  11/29/2019  8:30 AM York Spaniel, MD [facility] None    12/01/2019  3:50 PM Sharyon Cable, CNM [facility] None  12/16/2019  1:30 PM [facility] NURSE [facility]  12/16/2019  1:45 PM [facility] US6 [facility]    Judeth Horn, NP

## 2019-11-25 ENCOUNTER — Encounter: Payer: Self-pay | Admitting: Student

## 2019-11-25 DIAGNOSIS — D696 Thrombocytopenia, unspecified: Secondary | ICD-10-CM | POA: Insufficient documentation

## 2019-11-25 LAB — CBC
Hematocrit: 37 % (ref 34.0–46.6)
Hemoglobin: 12.1 g/dL (ref 11.1–15.9)
MCH: 31.2 pg (ref 26.6–33.0)
MCHC: 32.7 g/dL (ref 31.5–35.7)
MCV: 95 fL (ref 79–97)
Platelets: 138 10*3/uL — ABNORMAL LOW (ref 150–450)
RBC: 3.88 x10E6/uL (ref 3.77–5.28)
RDW: 13.1 % (ref 11.7–15.4)
WBC: 9.5 10*3/uL (ref 3.4–10.8)

## 2019-11-29 ENCOUNTER — Ambulatory Visit: Payer: Medicaid Other | Admitting: Neurology

## 2019-11-29 ENCOUNTER — Encounter: Payer: Self-pay | Admitting: Neurology

## 2019-11-29 VITALS — BP 99/61 | HR 86 | Ht 66.0 in | Wt 156.0 lb

## 2019-11-29 DIAGNOSIS — M5431 Sciatica, right side: Secondary | ICD-10-CM

## 2019-11-29 MED ORDER — GABAPENTIN 100 MG PO CAPS
ORAL_CAPSULE | ORAL | 3 refills | Status: DC
Start: 2019-11-29 — End: 2020-08-30

## 2019-11-29 NOTE — Progress Notes (Signed)
Reason for visit: Right-sided sciatica  Carol Rangel is an 34 y.o. female  History of present illness:  Carol Rangel is a 34 year old right-handed [place] female with a history of right-sided sciatica that has been present for about 2 and half years.  The patient currently is pregnant, her due date is on 10 January 2020.  The patient was given a trial on amitriptyline, she was worked up for 30 mg nightly dose but claims it was not helpful and stopped the medication.  The patient continues to have her sciatica that seems to be better in the morning and worse as the day goes on.  Prior EMG and nerve conduction study evaluation was unremarkable.  The patient has normal reflexes and normal strength in the right leg.  The patient currently sleeping fairly well.  She returns to the office today for an evaluation.  Past Medical History:  Diagnosis Date  . Medical history non-contributory   . Nausea with vomiting 12/20/2013  . Sciatica of right side 09/24/2019    Past Surgical History:  Procedure Laterality Date  . BREAST SURGERY Right    ?abscess surgery x2    Family History  Problem Relation Age of Onset  . Cancer Maternal Grandmother   . Hypertension Mother     Social history:  reports that she has never smoked. She has never used smokeless tobacco. She reports that she does not drink alcohol and does not use drugs.   No Known Allergies  Medications:  Prior to Admission medications   Medication Sig Start Date End Date Taking? Authorizing Provider  Accu-Chek Softclix Lancets lancets Use as instructed 10/29/19  Yes Laury Deep, CNM  Blood Glucose Monitoring Suppl (ACCU-CHEK GUIDE) w/Device KIT 1 each by Does not apply route 4 (four) times daily. 10/29/19  Yes Laury Deep, CNM  Elastic Bandages & Supports (COMFORT FIT MATERNITY SUPP SM) MISC 1 Units by Does not apply route daily as needed. 11/24/19  Yes Jorje Guild, NP  glucose blood (ACCU-CHEK GUIDE) test strip Use as instructed  11/24/19  Yes Jorje Guild, NP  Prenatal Vit-Fe Fumarate-FA (MULTIVITAMIN-PRENATAL) 27-0.8 MG TABS tablet Take 1 tablet by mouth daily at 12 noon.   Yes [provider]    ROS:  Out of a complete 14 system review of symptoms, the patient complains only of the following symptoms, and all other reviewed systems are negative.  Right leg pain Current pregnancy  Last menstrual period 04/05/2019.  Physical Exam  General: The patient is alert and cooperative at the time of the examination.  The patient is pregnant.  Skin: No significant peripheral edema is noted.   Neurologic Exam  Mental status: The patient is alert and oriented x 3 at the time of the examination. The patient has apparent normal recent and remote memory, with an apparently normal attention span and concentration ability.   Cranial nerves: Facial symmetry is present. Speech is normal, no aphasia or dysarthria is noted. Extraocular movements are full. Visual fields are full.  Motor: The patient has good strength in all 4 extremities.  Sensory examination: Soft touch sensation is symmetric on the face, arms, and legs.  Coordination: The patient has good finger-nose-finger and heel-to-shin bilaterally.  Gait and station: The patient has a normal gait. Tandem gait is normal. Romberg is negative. No drift is seen.  The patient is able to walk on his heels and the toes bilaterally.  Reflexes: Deep tendon reflexes are symmetric.  Ankle jerk reflexes are well-maintained and symmetric.  Assessment/Plan:  1.  Right-sided sciatica  2.  Current pregnancy  The patient will be placed on gabapentin as she is in the third trimester of her pregnancy.  She will work up to 200 mg 3 times daily.  She will call for any dose adjustments.  Once she delivers, she may contact our office and we will consider MRI evaluation of the lumbar spine.  The gabapentin should be safe with lactation.  She will follow-up here in 4  months.  Jill Alexanders MD 11/29/2019 8:26 AM  [facility] [address] [place], [place] [address]  Phone [phone] Fax [phone]

## 2019-12-01 ENCOUNTER — Encounter: Payer: Self-pay | Admitting: Certified Nurse Midwife

## 2019-12-01 ENCOUNTER — Ambulatory Visit (INDEPENDENT_AMBULATORY_CARE_PROVIDER_SITE_OTHER): Payer: Medicaid Other | Admitting: Certified Nurse Midwife

## 2019-12-01 ENCOUNTER — Other Ambulatory Visit: Payer: Self-pay

## 2019-12-01 VITALS — BP 109/70 | HR 90 | Wt 156.6 lb

## 2019-12-01 DIAGNOSIS — Z3A34 34 weeks gestation of pregnancy: Secondary | ICD-10-CM

## 2019-12-01 DIAGNOSIS — Z3400 Encounter for supervision of normal first pregnancy, unspecified trimester: Secondary | ICD-10-CM

## 2019-12-01 DIAGNOSIS — D696 Thrombocytopenia, unspecified: Secondary | ICD-10-CM

## 2019-12-01 DIAGNOSIS — O99113 Other diseases of the blood and blood-forming organs and certain disorders involving the immune mechanism complicating pregnancy, third trimester: Secondary | ICD-10-CM

## 2019-12-01 DIAGNOSIS — O24410 Gestational diabetes mellitus in pregnancy, diet controlled: Secondary | ICD-10-CM

## 2019-12-01 NOTE — Progress Notes (Addendum)
   PRENATAL VISIT NOTE  Subjective:  Carol Rangel is a 34 y.o. G1P0 at [redacted]w[redacted]d being seen today for ongoing prenatal care.  She is currently monitored for the following issues for this high-risk pregnancy and has Malnutrition of moderate degree (HCC); Supervision of normal first pregnancy, antepartum; Late prenatal care; Advanced maternal age, 1st pregnancy; Sciatica of right side; Gastroesophageal reflux during pregnancy in third trimester, antepartum; Language barrier affecting health care; Gestational diabetes mellitus, class A1; and Gestational thrombocytopenia (HCC) on their problem list.  Patient reports no complaints.  Contractions: Irritability.  .  Movement: Present. Denies leaking of fluid.   The following portions of the patient's history were reviewed and updated as appropriate: allergies, current medications, past family history, past medical history, past social history, past surgical history and problem list.   Objective:   Vitals:   12/01/19 1556  BP: 109/70  Pulse: 90  Weight: 156 lb 9.6 oz (71 kg)    Fetal Status: Fetal Heart Rate (bpm): 136 Fundal Height: 31 cm Movement: Present  Presentation: Vertex  General:  Alert, oriented and cooperative. Patient is in no acute distress.  Skin: Skin is warm and dry. No rash noted.   Cardiovascular: Normal heart rate noted  Respiratory: Normal respiratory effort, no problems with respiration noted  Abdomen: Soft, gravid, appropriate for gestational age.  Pain/Pressure: Present     Pelvic: Cervical exam deferred        Extremities: Normal range of motion.  Edema: Trace  Mental Status: Normal mood and affect. Normal behavior. Normal judgment and thought content.   Assessment and Plan:  Pregnancy: G1P0 at [redacted]w[redacted]d 1. Supervision of normal first pregnancy, antepartum - Patient doing well, no complaints  - Routine prenatal care  - Anticipatory guidance on upcoming appointments including GBS at next appointment, discussed with patient if  GBS is positive then will recommend antibiotics during labor, patient verbalizes understanding   2. Benign gestational thrombocytopenia in third trimester (HCC) - stable, PLT 138 on 9/8 - needs repeat CBC at 36 week visit    3. Gestational diabetes mellitus, class A1 - Reviewed CBGs with patient  Fasting range from 93-95 , 2 hr PP range from 102-118 - Patient reports fasting is over a course of 13 hours, encouraged patient to eat a protein snack at night prior to bedtime to help decrease fasting level , patient verbalizes understanding   4. [redacted] weeks gestation of pregnancy  Preterm labor symptoms and general obstetric precautions including but not limited to vaginal bleeding, contractions, leaking of fluid and fetal movement were reviewed in detail with the patient. Please refer to After Visit Summary for other counseling recommendations.   Return in about 2 weeks (around 12/15/2019) for ROB/GBS.  Future Appointments  Date Time Provider Department Center  12/16/2019  1:30 PM [facility] NURSE [facility]  12/16/2019  1:45 PM [facility] US6 [facility]  12/17/2019 11:10 AM Armando Reichert, CNM [facility] None  04/06/2020  9:00 AM York Spaniel, MD [facility] None    Sharyon Cable, CNM

## 2019-12-01 NOTE — Patient Instructions (Signed)

## 2019-12-08 ENCOUNTER — Encounter: Payer: Self-pay | Admitting: General Practice

## 2019-12-16 ENCOUNTER — Ambulatory Visit: Payer: Medicaid Other | Attending: Obstetrics and Gynecology

## 2019-12-16 ENCOUNTER — Ambulatory Visit: Payer: Medicaid Other | Admitting: *Deleted

## 2019-12-16 ENCOUNTER — Other Ambulatory Visit: Payer: Self-pay

## 2019-12-16 DIAGNOSIS — O0930 Supervision of pregnancy with insufficient antenatal care, unspecified trimester: Secondary | ICD-10-CM

## 2019-12-16 DIAGNOSIS — Z3A36 36 weeks gestation of pregnancy: Secondary | ICD-10-CM

## 2019-12-16 DIAGNOSIS — O09519 Supervision of elderly primigravida, unspecified trimester: Secondary | ICD-10-CM | POA: Diagnosis present

## 2019-12-16 DIAGNOSIS — O99113 Other diseases of the blood and blood-forming organs and certain disorders involving the immune mechanism complicating pregnancy, third trimester: Secondary | ICD-10-CM

## 2019-12-16 DIAGNOSIS — O283 Abnormal ultrasonic finding on antenatal screening of mother: Secondary | ICD-10-CM | POA: Diagnosis not present

## 2019-12-16 DIAGNOSIS — Z362 Encounter for other antenatal screening follow-up: Secondary | ICD-10-CM

## 2019-12-16 DIAGNOSIS — O24410 Gestational diabetes mellitus in pregnancy, diet controlled: Secondary | ICD-10-CM | POA: Insufficient documentation

## 2019-12-16 DIAGNOSIS — O24419 Gestational diabetes mellitus in pregnancy, unspecified control: Secondary | ICD-10-CM | POA: Diagnosis not present

## 2019-12-16 DIAGNOSIS — O09513 Supervision of elderly primigravida, third trimester: Secondary | ICD-10-CM

## 2019-12-16 DIAGNOSIS — O0932 Supervision of pregnancy with insufficient antenatal care, second trimester: Secondary | ICD-10-CM

## 2019-12-16 DIAGNOSIS — O24415 Gestational diabetes mellitus in pregnancy, controlled by oral hypoglycemic drugs: Secondary | ICD-10-CM | POA: Diagnosis not present

## 2019-12-16 DIAGNOSIS — Z3400 Encounter for supervision of normal first pregnancy, unspecified trimester: Secondary | ICD-10-CM

## 2019-12-16 DIAGNOSIS — D696 Thrombocytopenia, unspecified: Secondary | ICD-10-CM | POA: Diagnosis present

## 2019-12-16 IMAGING — US US MFM OB FOLLOW-UP
1 series · 14 of 28 positions shown · non-contrast
Comparison: none

[Series 1: us mfm ob follow-up · 37 acquisitions, 14 frames shown]
[im 2/37]
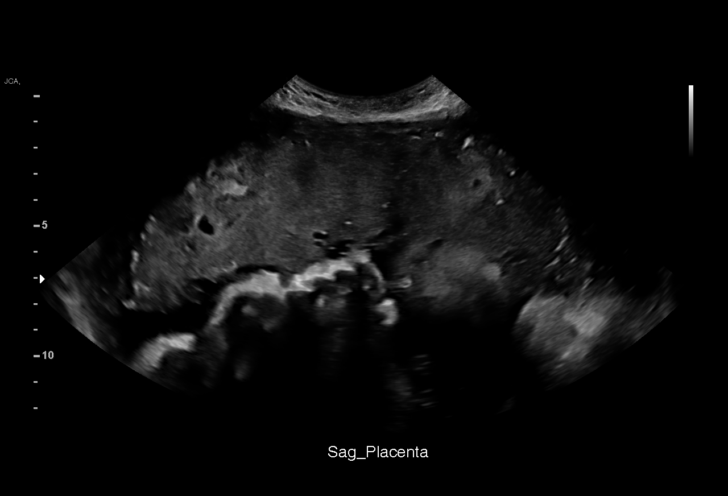
[im 5/37]
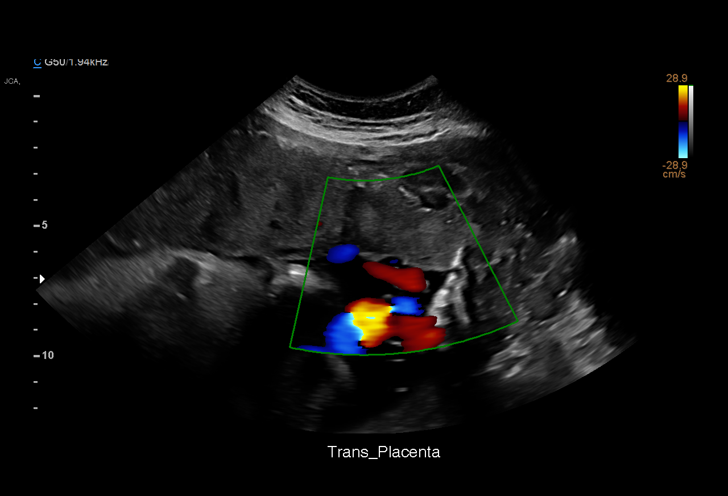
[im 7/37]
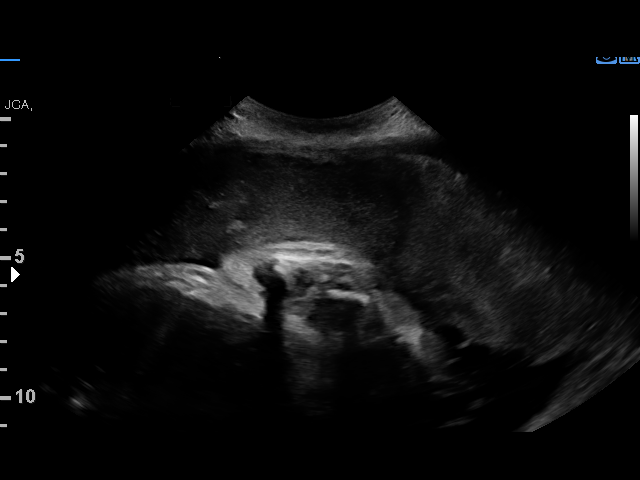
[im 10/37]
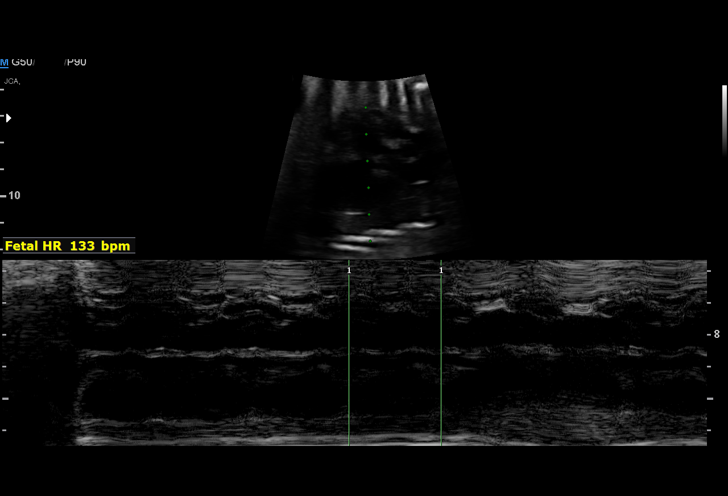
[im 13/37]
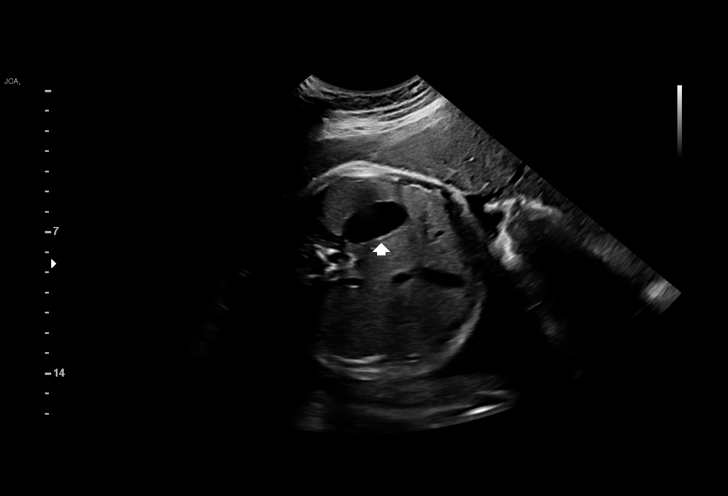
[im 15/37]
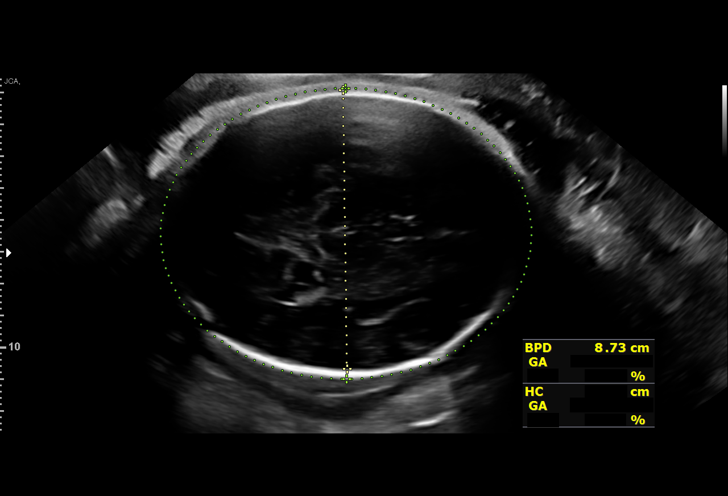
[im 18/37]
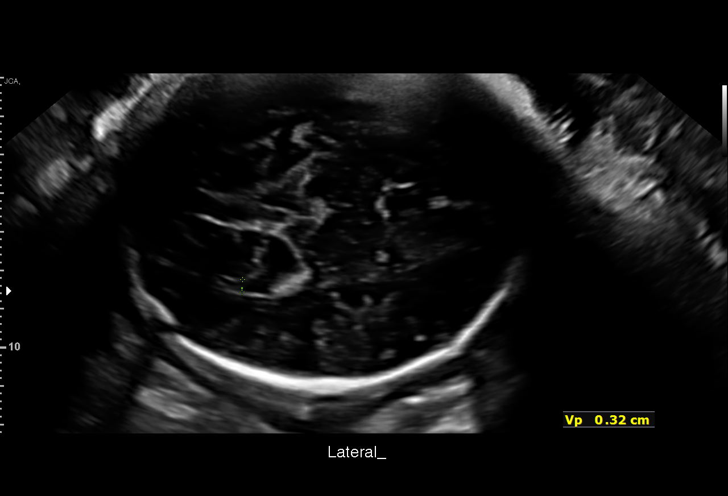
[im 21/37]
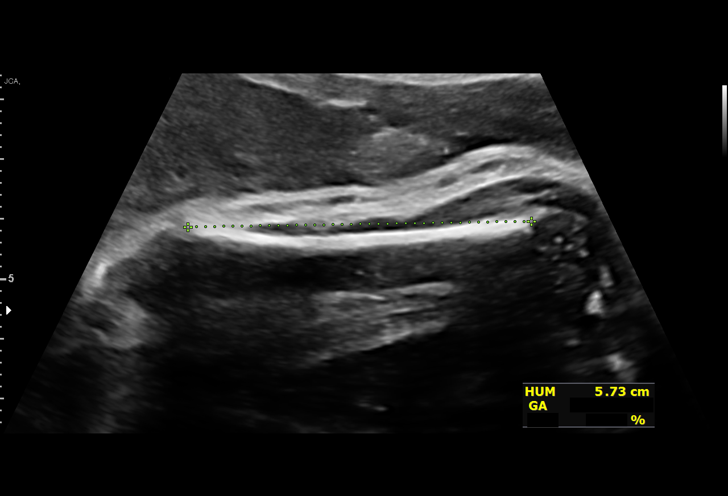
[im 23/37]
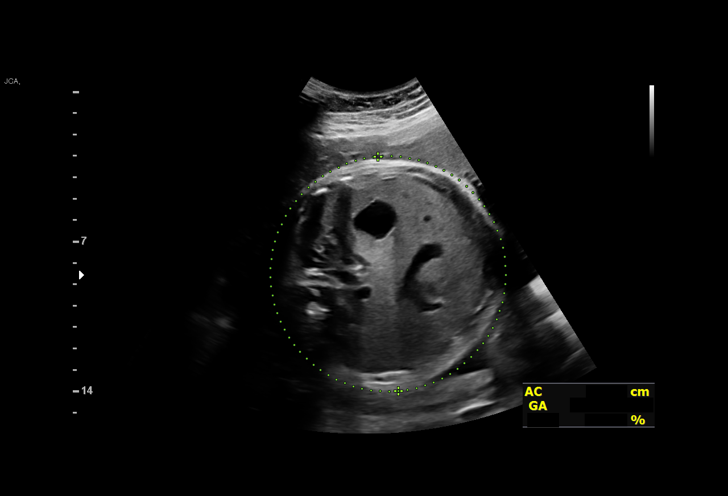
[im 26/37]
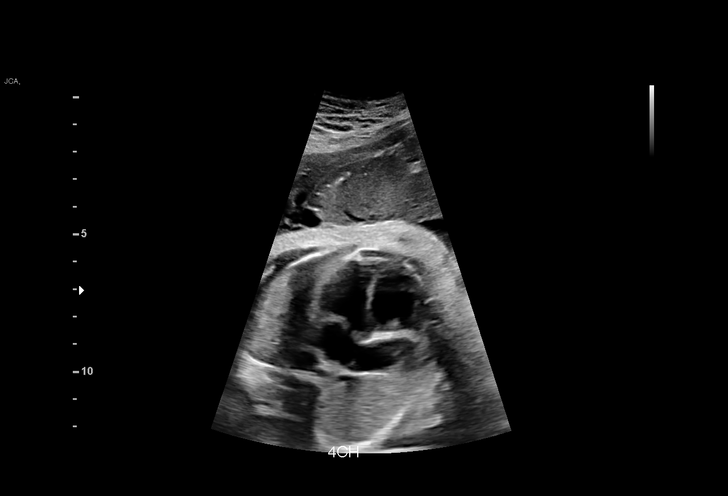
[im 29/37]
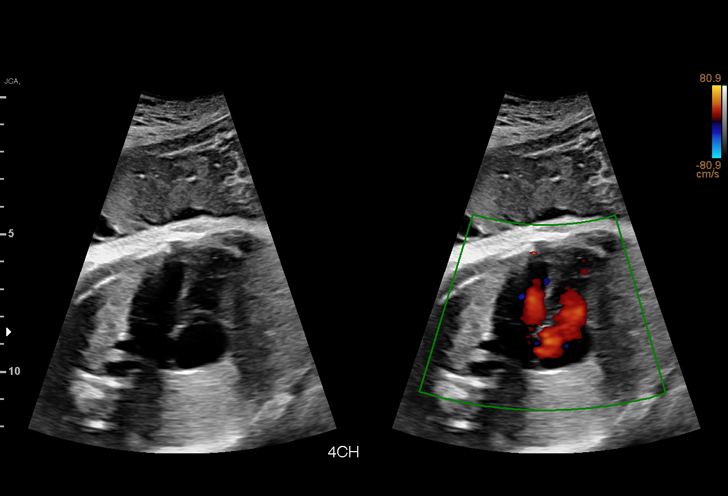
[im 31/37]
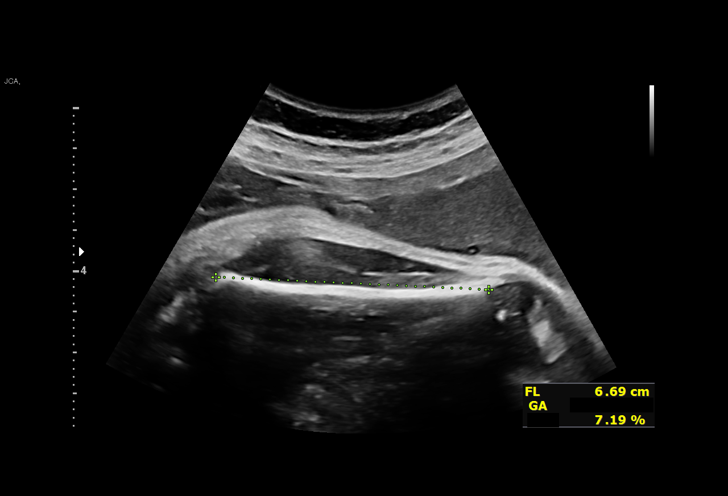
[im 34/37]
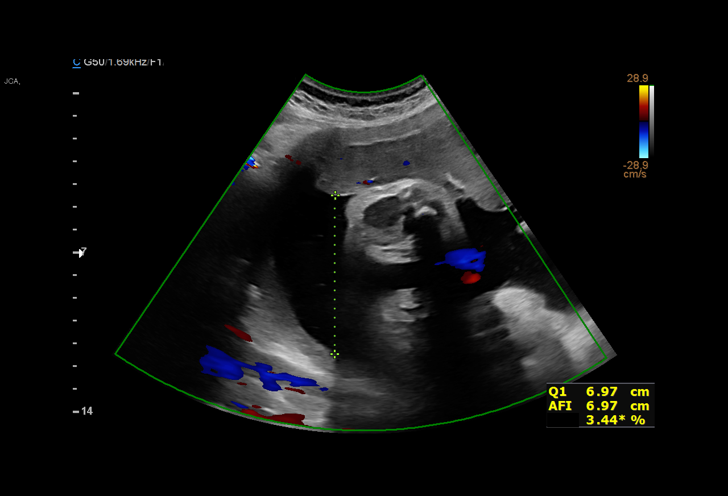
[im 37/37]
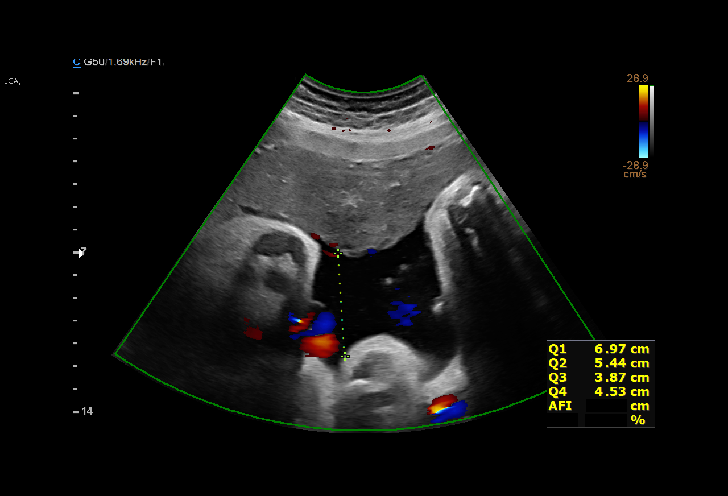

[14 of 28 positions shown; findings below may reference images not displayed]

Indications

 Pyelectasis of fetus on prenatal ultrasound    [XO]
 (resolved)
 Advanced maternal age primigravida 35+,        [XO]
 second trimester
 Late prenatal care, second trimester           [XO]
 36 weeks gestation of pregnancy
 Encounter for other antenatal screening        [XO]
 follow-up
Fetal Evaluation

 Num Of Fetuses:         1
 Fetal Heart Rate(bpm):  119
 Cardiac Activity:       Observed
 Presentation:           Cephalic
 Placenta:               Anterior
 P. Cord Insertion:      Visualized

 Amniotic Fluid
 AFI FV:      Within normal limits

 AFI Sum(cm)     %Tile       Largest Pocket(cm)
 20.81           79

 RUQ(cm)       RLQ(cm)       LUQ(cm)        LLQ(cm)

Biometry

 BPD:      88.5  mm     G. Age:  35w 5d         43  %    CI:         71.6   %    70 - 86
                                                         FL/HC:      20.1   %    20.1 -
 HC:       333   mm     G. Age:  38w 0d         61  %    HC/AC:      0.97        0.93 -
 AC:      342.7  mm     G. Age:  38w 1d         95  %    FL/BPD:     75.6   %    71 - 87
 FL:       66.9  mm     G. Age:  34w 3d          7  %    FL/AC:      19.5   %    20 - 24
 HUM:      57.6  mm     G. Age:  33w 3d         12  %

 LV:        3.2  mm
 Est. FW:    [XO]  gm    6 lb 13 oz      69  %
OB History

 Maternal Racial/Ethnic Group:   Asian
 Gravidity:    1         Term:   0        Prem:   0        SAB:   0
 TOP:          0       Ectopic:  0        Living: 0
Gestational Age

 LMP:           36w 3d        Date:  [DATE]                 EDD:   [DATE]
 U/S Today:     36w 4d                                        EDD:   [DATE]
 Best:          36w 3d     Det. By:  LMP  ([DATE])          EDD:   [DATE]
Anatomy

 Cranium:               Appears normal         Aortic Arch:            Previously seen
 Cavum:                 Appears normal         Ductal Arch:            Previously seen
 Ventricles:            Appears normal         Diaphragm:              Appears normal
 Choroid Plexus:        Previously seen        Stomach:                Appears normal, left
                                                                       sided
 Cerebellum:            Previously seen        Abdomen:                Appears normal
 Posterior Fossa:       Previously seen        Abdominal Wall:         Previously seen
 Nuchal Fold:           Not applicable (>20    Cord Vessels:           Previously seen
                        wks GA)
 Face:                  Orbits and profile     Kidneys:                Previously seen
                        previously seen
 Lips:                  Previously seen        Bladder:                Appears normal
 Thoracic:              Appears normal         Spine:                  Previously seen
 Heart:                 Previously seen        Upper Extremities:      Previously seen
 RVOT:                  Previously seen        Lower Extremities:      Previously seen
 LVOT:                  Previously seen

 Other:  Heels visualized. Nasal bone visualized. Technically difficult due to
         fetal position.
Impression

 Gestational diabetes. Well-controlled on diet .
 Fetal growth is appropriate for gestational age .Amniotic fluid
 is normal and good fetal activity is seen .
Recommendations

 Follow-up scans as clinically indicated.
                 RISTIC

## 2019-12-17 ENCOUNTER — Other Ambulatory Visit (HOSPITAL_COMMUNITY)
Admission: RE | Admit: 2019-12-17 | Discharge: 2019-12-17 | Disposition: A | Discharge status: Home / Self Care | Payer: Medicaid Other | Source: Ambulatory Visit | Attending: Advanced Practice Midwife | Admitting: Advanced Practice Midwife

## 2019-12-17 ENCOUNTER — Ambulatory Visit (INDEPENDENT_AMBULATORY_CARE_PROVIDER_SITE_OTHER): Payer: Medicaid Other | Admitting: Advanced Practice Midwife

## 2019-12-17 VITALS — BP 105/69 | HR 76 | Temp 97.9°F | Wt 160.4 lb

## 2019-12-17 DIAGNOSIS — Z3400 Encounter for supervision of normal first pregnancy, unspecified trimester: Secondary | ICD-10-CM | POA: Insufficient documentation

## 2019-12-17 DIAGNOSIS — Z3A36 36 weeks gestation of pregnancy: Secondary | ICD-10-CM

## 2019-12-17 DIAGNOSIS — O24410 Gestational diabetes mellitus in pregnancy, diet controlled: Secondary | ICD-10-CM

## 2019-12-17 NOTE — Patient Instructions (Signed)
Contraception Choices Contraception, also called birth control, refers to methods or devices that prevent pregnancy. Hormonal methods Contraceptive implant  A contraceptive implant is a thin, plastic tube that contains a hormone. It is inserted into the upper part of the arm. It can remain in place for up to 3 years. Progestin-only injections Progestin-only injections are injections of progestin, a synthetic form of the hormone progesterone. They are given every 3 months by a health care provider. Birth control pills  Birth control pills are pills that contain hormones that prevent pregnancy. They must be taken once a day, preferably at the same time each day. Birth control patch  The birth control patch contains hormones that prevent pregnancy. It is placed on the skin and must be changed once a week for three weeks and removed on the fourth week. A prescription is needed to use this method of contraception. Vaginal ring  A vaginal ring contains hormones that prevent pregnancy. It is placed in the vagina for three weeks and removed on the fourth week. After that, the process is repeated with a new ring. A prescription is needed to use this method of contraception. Emergency contraceptive Emergency contraceptives prevent pregnancy after unprotected sex. They come in pill form and can be taken up to 5 days after sex. They work best the sooner they are taken after having sex. Most emergency contraceptives are available without a prescription. This method should not be used as your only form of birth control. Barrier methods Female condom  A female condom is a thin sheath that is worn over the penis during sex. Condoms keep sperm from going inside a woman's body. They can be used with a spermicide to increase their effectiveness. They should be disposed after a single use. Female condom  A female condom is a soft, loose-fitting sheath that is put into the vagina before sex. The condom keeps sperm  from going inside a woman's body. They should be disposed after a single use. Diaphragm  A diaphragm is a soft, dome-shaped barrier. It is inserted into the vagina before sex, along with a spermicide. The diaphragm blocks sperm from entering the uterus, and the spermicide kills sperm. A diaphragm should be left in the vagina for 6-8 hours after sex and removed within 24 hours. A diaphragm is prescribed and fitted by a health care provider. A diaphragm should be replaced every 1-2 years, after giving birth, after gaining more than 15 lb (6.8 kg), and after pelvic surgery. Cervical cap  A cervical cap is a round, soft latex or plastic cup that fits over the cervix. It is inserted into the vagina before sex, along with spermicide. It blocks sperm from entering the uterus. The cap should be left in place for 6-8 hours after sex and removed within 48 hours. A cervical cap must be prescribed and fitted by a health care provider. It should be replaced every 2 years. Sponge  A sponge is a soft, circular piece of polyurethane foam with spermicide on it. The sponge helps block sperm from entering the uterus, and the spermicide kills sperm. To use it, you make it wet and then insert it into the vagina. It should be inserted before sex, left in for at least 6 hours after sex, and removed and thrown away within 30 hours. Spermicides Spermicides are chemicals that kill or block sperm from entering the cervix and uterus. They can come as a cream, jelly, suppository, foam, or tablet. A spermicide should be inserted into the   vagina with an applicator at least 10-15 minutes before sex to allow time for it to work. The process must be repeated every time you have sex. Spermicides do not require a prescription. Intrauterine contraception Intrauterine device (IUD) An IUD is a T-shaped device that is put in a woman's uterus. There are two types:  Hormone IUD.This type contains progestin, a synthetic form of the hormone  progesterone. This type can stay in place for 3-5 years.  Copper IUD.This type is wrapped in copper wire. It can stay in place for 10 years.  Permanent methods of contraception Female tubal ligation In this method, a woman's fallopian tubes are sealed, tied, or blocked during surgery to prevent eggs from traveling to the uterus. Hysteroscopic sterilization In this method, a small, flexible insert is placed into each fallopian tube. The inserts cause scar tissue to form in the fallopian tubes and block them, so sperm cannot reach an egg. The procedure takes about 3 months to be effective. Another form of birth control must be used during those 3 months. Female sterilization This is a procedure to tie off the tubes that carry sperm (vasectomy). After the procedure, the man can still ejaculate fluid (semen). Natural planning methods Natural family planning In this method, a couple does not have sex on days when the woman could become pregnant. Calendar method This means keeping track of the length of each menstrual cycle, identifying the days when pregnancy can happen, and not having sex on those days. Ovulation method In this method, a couple avoids sex during ovulation. Symptothermal method This method involves not having sex during ovulation. The woman typically checks for ovulation by watching changes in her temperature and in the consistency of cervical mucus. Post-ovulation method In this method, a couple waits to have sex until after ovulation. Summary  Contraception, also called birth control, means methods or devices that prevent pregnancy.  Hormonal methods of contraception include implants, injections, pills, patches, vaginal rings, and emergency contraceptives.  Barrier methods of contraception can include female condoms, female condoms, diaphragms, cervical caps, sponges, and spermicides.  There are two types of IUDs (intrauterine devices). An IUD can be put in a woman's uterus to  prevent pregnancy for 3-5 years.  Permanent sterilization can be done through a procedure for males, females, or both.  Natural family planning methods involve not having sex on days when the woman could become pregnant. This information is not intended to replace advice given to you by your health care provider. Make sure you discuss any questions you have with your health care provider. Document Revised: 03/06/2017 Document Reviewed: 04/06/2016 Elsevier Patient Education  2020 [employer]  COVID-19 Vaccination if [place] Are Pregnant or Breastfeeding  The Society for Maternal-Fetal Medicine (SMFM) and other pregnancy experts recommend that pregnant and lactating people be vaccinated against COVID-19. The Centers for Disease Control and Prevention (CDC) also recommend vaccination for "all people aged 36 years and older, including people who are pregnant, breastfeeding, trying to get pregnant now, or might become pregnant in the future." Vaccination is the best way to reduce the risks of COVID-19 infection and COVID-related complications for both you and your baby.  Three vaccines are available to prevent COVID-19: . The two-dose Pfizer vaccine for people 12 years and older--APPROVED by the US Food and Drug Administration on November 08, 2019 . The two-dose Moderna vaccine for people 18 years and older--AUTHORIZED for emergency use . The one-dose [employer] vaccine for people 18 years and older (you  may also see this vaccine referred to as the "Janssen vaccine")--AUTHORIZED for emergency use  For those receiving the Pfizer and Moderna vaccines, the second dose is given 21 days [employer]) and 28 days (Moderna) after the first dose. The [employer] vaccine is only one dose.  Information for Pregnant Individuals If you are pregnant or planning to become pregnant and are thinking about getting vaccinated, consider talking with your health care professional about the vaccine.   To help with  your decision, you should consider the following key points: Anyone can get the COVID vaccines free of charge regardless of immigration status or whether they have insurance. You may be asked for your social security number, but it is  NOT required to get vaccinated.  What are benefits of getting the COVID-19 vaccines during pregnancy?  . The vaccines can help protect you from getting COVID-19. With the two-dose vaccines, you must get both doses for maximum effectiveness. It's not yet known how long protection lasts.  . Another potential benefit is that getting the vaccine while pregnant may help you pass antiCOVID-19 antibodies to your baby. In numerous studies of vaccinated moms, antibodies were found in the umbilical cord blood of babies and in the mother's breastmilk.  . The CDC, along with other federal partners, are monitoring people who have been vaccinated for serious side effects. So far, more than 139,000 pregnant people have been vaccinated. No unexpected pregnancy or fetal problems have occurred. There have been no reports of any increased risk of pregnancy loss, growth problems, or birth defects.  . A safe vaccine is generally considered one in which the benefits of being vaccinated outweigh the risks. The current vaccines are not live vaccines. There is only a very small  chance that they cross the placenta, so it's unlikely that they even reach the fetus. Vaccines don't affect future fertility. The only people who should NOT get vaccinated are those who have had a severe allergic reaction to vaccines in the past or any vaccine ingredients.  . Side effects may occur in the first 3 days after getting vaccinated.1 These include mild to moderate fever, headache, and muscle aches. Side effects may be worse after the second dose of the [employer] and Moderna vaccines. Fever should be avoided during pregnancy,especially in the first trimester. Those who develop a fever after vaccination can  take acetaminophen (Tylenol). This medication is safe to use during pregnancy and does not  affect how the vaccine works.   What are the known risks of getting COVID-19 during pregnancy?  About 1 to 3 per 1,000 pregnant women with COVID-19 will develop severe disease. Compared with those who aren't pregnant, pregnant people infected by the COVID-19 virus: . Are 3 times more likely to need ICU care . Are 2 to 3 times more likely to need advanced life support and a breathing tube  . Have a small increased risk of dying due to COVID-19 They may also be at increased risk of stillbirth and preterm birth.  What is my risk of getting COVID-19?  Your risk of getting COVID-19 depends on the chance that you will come into contact with another infected person. The risk may be higher if you live in a community where there is a lot of COVID-19 infection or work in healthcare or another high-contact setting.   What is my risk for severe complications if I get COVID-19?  Data show that older pregnant women; those with preexisting health conditions, such as a body  mass index higher than 35 kg/m2, diabetes, and heart disorders; and Black or Latinx women have an especially increased risk of severe disease and death from COVID-19.   If you still have questions about the vaccines or need more information, ask your health care provider or go to the Centers for Disease Control and Prevention's COVID-19 vaccine webpage.   An Update on the [employer]user-[employer] Vaccine   In April 2021, the FDA and CDC called for a brief pause to use of the [employer]user-[employer] vaccine. They did so after reports of a severe side effect in a very small number of women younger than age 38 following vaccination. This side effect, called thrombosis with thrombocytopenia syndrome (TTS), causes blood clots (thrombosis) combined with low levels of platelets (thrombocytopenia).  TTS following the [employer]user-[employer] vaccine is extremely rare. At the  time of this update, it has occurred in only 7 people per 1 million [employer] shots given. According to the CDC, being on hormonal birth control (the pill, patch, or ring), pregnancy, breastfeeding, or being recently pregnant does not make you more likely to develop TTS after getting the [employer] vaccine. The pause was lifted on July 09, 2019, after the FDA and CDC determined that the known benefits of the [employer] vaccine far outweigh the risks.   Health care professionals have been alerted to the possibility of this side effect in people who have received the [facility] vaccine. National organizations continue to recommend COVID-19 vaccination with any of the vaccines for pregnant women. All women younger than age 56 years, whether pregnant, breastfeeding, or not, should be aware of the very rare risk of TTS after getting the [employer] vaccine. The Pfizer and Moderna vaccines don't have this risk. If you get the [employer] vaccine,  seek medical help right away if you develop any of the following symptoms within 3 weeks of getting your shot:  . Severe or persistent headaches or blurred vision . Shortness of breath . Chest pain . Leg swelling . Persistent abdominal pain . Easy bruising or tiny blood spots under the skin beyond the injection site  Experts continue to collect health and safety information from pregnant people who have been vaccinated. If you have questions about vaccination during pregnancy, visit the CDC website or talk to your health care professional. Information for Breastfeeding/Lactating Individuals The Society for Maternal-Fetal Medicine and other pregnancy experts recommend COVID-19 vaccination for people who are breastfeeding/lactating. You don't have to delay or stop  breastfeeding just because you get vaccinated.   Getting Vaccinated  You can get vaccinated at any time during pregnancy. The CDC is committed to monitoring the  vaccine's safety for all individuals. Your health professional or vaccine clinic may give you information about enrolling in the v-safe after vaccination health checker (see the box below).Even after you're fully vaccinated, it is important to follow the CDC's guidance for wearing a mask indoors in areas where there are substantial or high rates of COVID-19 infection.   What Happens When You Enroll in v-Safe?  The v-safe after vaccination health checker program lets the CDC check in with you after your vaccination. At sign-up, you can indicate that you are pregnant. Once you do that, expect the following: . Someone may call you from the v-safe program to ask initial questions and get more information. . You may be asked to enroll in the vaccine pregnancy registry, which is collecting information  about any effects of the vaccine during pregnancy. This is a great way to help scientists monitor the vaccine's safety and effectiveness.   References 1. [address], [place] Lo M, Wallace M, Curran KG, Chamberland M, et al. The Advisory Committee on [employer]' Interim Recommendation for Use of Pfizer-BioNTech  COVID-19 Vaccine -- [place], December 2020. MMWR Morbidity and Mortality Weekly Report 2020;69. 2. FDA Briefing Document. Janssen Ad26.COV2.S Vaccine for the Prevention of COVID-19. 2021. Accessed May 21, 2019; Available from: FemaleHumor.es 3. PFIZER-BIONTECH COVID-19 VACCINE [package insert] [place]: Pfizer and [place], [place]: Biontech;2020. 4. FDA Briefing Document. Moderna COVID-19 Vaccine. 2020. Accessed 2020, Dec 18; Available from: DateTunes.nl  5. Bea Laura, Bordt EA, Atyeo C, Deriso E, Akinwunmi B, Young N, et al. COVID-19 vaccine response in pregnant and lactating women: a cohort study. Am J Obstet Gynecol 2021 Mar 24. 6. Panagiotakopoulos Elbert Ewings, Omer Jack, Lipkind HS, Caprice Renshaw DS, et al. SARS-CoV-2  Infection Among Hospitalized Pregnant Women: Reasons for Admission and Pregnancy  Characteristics - [facility], March 1-Aug 15, 2018. MMWR Morb Baxter Flattery Rep 2020 Sep 23;69(38):1355-9. 7. Zambrano LD, Groom, Strid P, [place], Baldo Ash [place], et al. Update: Characteristics of Symptomatic Women of Reproductive Age with Laboratory-Confirmed SARSCoV-2 Infection by Pregnancy Status - [place] States, January 22-December 19, 2018. MMWR Morb Baxter Flattery Rep 2020 Nov 6;69(44):1641-7. 8. Delahoy MJ, Georgia Duff, Weston Settle PD, Blima Ledger, et al. Characteristics and Maternal and Birth Outcomes of Hospitalized Pregnant Women with Laboratory-Confirmed COVID-19 - COVID-NET, 70 States, March 1-November 07, 2018. MMWR Morb Baxter Flattery Rep 2020 Sep 25;69(38):1347-54.

## 2019-12-17 NOTE — Progress Notes (Signed)
PRENATAL VISIT NOTE  Subjective:  Carol Rangel is a 34 y.o. G1P0 at [redacted]w[redacted]d being seen today for ongoing prenatal care.  She is currently monitored for the following issues for this high-risk pregnancy and has Malnutrition of moderate degree (HCC); Supervision of normal first pregnancy, antepartum; Late prenatal care; Advanced maternal age, 1st pregnancy; Sciatica of right side; Gastroesophageal reflux during pregnancy in third trimester, antepartum; Language barrier affecting health care; Gestational diabetes mellitus, class A1; and Gestational thrombocytopenia (HCC) on their problem list.  Patient reports no complaints.  Contractions: Not present. Vag. Bleeding: None.  Movement: Present. Denies leaking of fluid.   The following portions of the patient's history were reviewed and updated as appropriate: allergies, current medications, past family history, past medical history, past social history, past surgical history and problem list.   Objective:   Vitals:   12/17/19 1106  BP: 105/69  Pulse: 76  Temp: 97.9 F (36.6 C)  Weight: 160 lb 6.4 oz (72.8 kg)    Fetal Status: Fetal Heart Rate (bpm): 130 Fundal Height: 35 cm Movement: Present     General:  Alert, oriented and cooperative. Patient is in no acute distress.  Skin: Skin is warm and dry. No rash noted.   Cardiovascular: Normal heart rate noted  Respiratory: Normal respiratory effort, no problems with respiration noted  Abdomen: Soft, gravid, appropriate for gestational age.  Pain/Pressure: Present     Pelvic: Cervical exam deferred        Extremities: Normal range of motion.  Edema: Mild pitting, slight indentation  Mental Status: Normal mood and affect. Normal behavior. Normal judgment and thought content.   Assessment and Plan:  Pregnancy: G1P0 at [redacted]w[redacted]d 1. Supervision of normal first pregnancy, antepartum - Cervicovaginal ancillary only( [facility]) - Culture, beta strep (group b only)  2. [redacted] weeks gestation of  pregnancy   3. Diet controlled gestational diabetes mellitus (GDM) in third trimester - Growth [place] with MFM on 9/30 - Glucose well controlled after meals, Fasting levels are often >90. Encouraged exercise in the evening. Patient also reports eating late at 11:00 pm many nights. Advised patient to have her last meal earlier in the night   Guidelines for Antenatal Testing and Sonography  (with updated ICD-10 codes)  Updated  02/17/19 with Dr. Noralee Space  INDICATION U/S 2 X week NST/AFI  or full BPP wkly DELIVERY  Diabetes   A1 - good control - O24.410    A2 - good control - O24.419      A2  - poor control or poor compliance - O24.419, E11.65   (Macrosomia or polyhydramnios) **E11.65 is extra code for poor control**    A2/B - O24.919  and B-C O24.319/Pregestational Type II DM    Poor control B-C or D-R-F-T - O24.319  or  Type I DM - O24.019  20-28-36  20-28-32-36  20-24-28-32-36   20-24-28-32-36  20-24-27-30-33-36  40 if still pregnant  32  32   32  28  39-40  39-39.6  36 or as per MFM (consider admission)  39  36 or earlier as per MFM     Term labor symptoms and general obstetric precautions including but not limited to vaginal bleeding, contractions, leaking of fluid and fetal movement were reviewed in detail with the patient. Please refer to After Visit Summary for other counseling recommendations.   No follow-ups on file.  Future Appointments  Date Time Provider Department Center  12/30/2019  1:10 PM Raelyn Mora, CNM [facility] None  04/06/2020  9:00 AM York Spaniel, MD [facility] None   Thressa Sheller DNP, CNM  12/17/19  11:49 AM

## 2019-12-20 ENCOUNTER — Encounter: Payer: Self-pay | Admitting: General Practice

## 2019-12-20 LAB — CERVICOVAGINAL ANCILLARY ONLY
Bacterial Vaginitis (gardnerella): POSITIVE — AB
Candida Glabrata: NEGATIVE
Candida Vaginitis: NEGATIVE
Chlamydia: NEGATIVE
Comment: NEGATIVE
Comment: NEGATIVE
Comment: NEGATIVE
Comment: NEGATIVE
Comment: NEGATIVE
Comment: NORMAL
Neisseria Gonorrhea: NEGATIVE
Trichomonas: NEGATIVE

## 2019-12-21 LAB — CULTURE, BETA STREP (GROUP B ONLY): Strep Gp B Culture: NEGATIVE

## 2019-12-30 ENCOUNTER — Ambulatory Visit (INDEPENDENT_AMBULATORY_CARE_PROVIDER_SITE_OTHER): Payer: Medicaid Other | Admitting: Obstetrics and Gynecology

## 2019-12-30 ENCOUNTER — Other Ambulatory Visit: Payer: Self-pay

## 2019-12-30 VITALS — BP 109/72 | HR 91 | Temp 98.5°F | Wt 159.0 lb

## 2019-12-30 DIAGNOSIS — O24410 Gestational diabetes mellitus in pregnancy, diet controlled: Secondary | ICD-10-CM

## 2019-12-30 DIAGNOSIS — Z3400 Encounter for supervision of normal first pregnancy, unspecified trimester: Secondary | ICD-10-CM

## 2019-12-30 DIAGNOSIS — Z3A38 38 weeks gestation of pregnancy: Secondary | ICD-10-CM

## 2019-12-30 NOTE — Progress Notes (Signed)
   LOW-RISK PREGNANCY OFFICE VISIT Patient name: Carol Rangel MRN [id number]  Date of birth: [birth year] Chief Complaint:   Routine Prenatal Visit  History of Present Illness:   Carol Rangel is a 34 y.o. G1P0 female at [redacted]w[redacted]d with an Estimated Date of Delivery: 01/10/20 being seen today for ongoing management of a low-risk pregnancy.  Today she reports no complaints. Contractions: Not present. Vag. Bleeding: None.  Movement: Present. denies leaking of fluid. Review of Systems:   Pertinent items are noted in HPI Denies abnormal vaginal discharge w/ itching/odor/irritation, headaches, visual changes, shortness of breath, chest pain, abdominal pain, severe nausea/vomiting, or problems with urination or bowel movements unless otherwise stated above. Pertinent History Reviewed:  Reviewed past medical,surgical, social, obstetrical and family history.  Reviewed problem list, medications and allergies. Physical Assessment:   Vitals:   12/30/19 1327  BP: 109/72  Pulse: 91  Temp: 98.5 F (36.9 C)  Weight: 159 lb (72.1 kg)  Body mass index is 25.66 kg/m.        Physical Examination:   General appearance: Well appearing, and in no distress  Mental status: Alert, oriented to person, place, and time  Skin: Warm & dry  Cardiovascular: Normal heart rate noted  Respiratory: Normal respiratory effort, no distress  Abdomen: Soft, gravid, nontender  Pelvic: Cervical exam deferred         Extremities: Edema: None  Fetal Status: Fetal Heart Rate (bpm): 156 Fundal Height: 36 cm Movement: Present Presentation: Vertex  No results found for this or any previous visit (from the past 24 hour(s)).  Assessment & Plan:  1) Low-risk pregnancy G1P0 at [redacted]w[redacted]d with an Estimated Date of Delivery: 01/10/20   2) Supervision of normal first pregnancy, antepartum - Discussed labor precautions - Last EFW 3089 gm 6 lbs 13 oz 69th %tile @ 36 wks (12/16/2019)  3) Diet controlled gestational diabetes mellitus (GDM) in  third trimester - Review of Blood Sugar Levels: FBS range = 90-93 mg/dL  2 hr PP range = 517-616  4) [redacted] weeks gestation of pregnancy     Meds: No orders of the defined types were placed in this encounter.  Labs/procedures today: none  Plan:  Continue routine obstetrical care   Reviewed: Term labor symptoms and general obstetric precautions including but not limited to vaginal bleeding, contractions, leaking of fluid and fetal movement were reviewed in detail with the patient.  All questions were answered.    Follow-up: Return in about 1 week (around 01/06/2020) for Return OB visit - cervical check.  No orders of the defined types were placed in this encounter.  Raelyn Mora MSN, CNM 12/30/2019 1:45 PM

## 2020-01-04 ENCOUNTER — Other Ambulatory Visit: Payer: Self-pay

## 2020-01-04 ENCOUNTER — Inpatient Hospital Stay (HOSPITAL_COMMUNITY): Payer: Medicaid Other | Admitting: Anesthesiology

## 2020-01-04 ENCOUNTER — Inpatient Hospital Stay (HOSPITAL_COMMUNITY)
Admission: AD | Admit: 2020-01-04 | Discharge: 2020-01-08 | DRG: 806 | Disposition: A | Discharge status: Home / Self Care | Payer: Medicaid Other | Attending: Family Medicine | Admitting: Family Medicine

## 2020-01-04 ENCOUNTER — Encounter (HOSPITAL_COMMUNITY): Payer: Self-pay | Admitting: Obstetrics & Gynecology

## 2020-01-04 DIAGNOSIS — O24420 Gestational diabetes mellitus in childbirth, diet controlled: Secondary | ICD-10-CM | POA: Diagnosis present

## 2020-01-04 DIAGNOSIS — R531 Weakness: Secondary | ICD-10-CM | POA: Diagnosis not present

## 2020-01-04 DIAGNOSIS — D62 Acute posthemorrhagic anemia: Secondary | ICD-10-CM | POA: Diagnosis not present

## 2020-01-04 DIAGNOSIS — R32 Unspecified urinary incontinence: Secondary | ICD-10-CM | POA: Diagnosis not present

## 2020-01-04 DIAGNOSIS — O99893 Other specified diseases and conditions complicating puerperium: Secondary | ICD-10-CM | POA: Diagnosis not present

## 2020-01-04 DIAGNOSIS — O99354 Diseases of the nervous system complicating childbirth: Secondary | ICD-10-CM | POA: Diagnosis present

## 2020-01-04 DIAGNOSIS — Z3A39 39 weeks gestation of pregnancy: Secondary | ICD-10-CM

## 2020-01-04 DIAGNOSIS — O9912 Other diseases of the blood and blood-forming organs and certain disorders involving the immune mechanism complicating childbirth: Secondary | ICD-10-CM | POA: Diagnosis present

## 2020-01-04 DIAGNOSIS — O99119 Other diseases of the blood and blood-forming organs and certain disorders involving the immune mechanism complicating pregnancy, unspecified trimester: Secondary | ICD-10-CM | POA: Diagnosis present

## 2020-01-04 DIAGNOSIS — R29898 Other symptoms and signs involving the musculoskeletal system: Secondary | ICD-10-CM

## 2020-01-04 DIAGNOSIS — M5430 Sciatica, unspecified side: Secondary | ICD-10-CM | POA: Diagnosis not present

## 2020-01-04 DIAGNOSIS — N710 Acute inflammatory disease of uterus: Secondary | ICD-10-CM | POA: Diagnosis not present

## 2020-01-04 DIAGNOSIS — O99113 Other diseases of the blood and blood-forming organs and certain disorders involving the immune mechanism complicating pregnancy, third trimester: Secondary | ICD-10-CM

## 2020-01-04 DIAGNOSIS — O26893 Other specified pregnancy related conditions, third trimester: Secondary | ICD-10-CM | POA: Diagnosis present

## 2020-01-04 DIAGNOSIS — Z20822 Contact with and (suspected) exposure to covid-19: Secondary | ICD-10-CM | POA: Diagnosis present

## 2020-01-04 DIAGNOSIS — O0930 Supervision of pregnancy with insufficient antenatal care, unspecified trimester: Secondary | ICD-10-CM

## 2020-01-04 DIAGNOSIS — O9081 Anemia of the puerperium: Secondary | ICD-10-CM | POA: Diagnosis not present

## 2020-01-04 DIAGNOSIS — O09519 Supervision of elderly primigravida, unspecified trimester: Secondary | ICD-10-CM

## 2020-01-04 DIAGNOSIS — Z3400 Encounter for supervision of normal first pregnancy, unspecified trimester: Secondary | ICD-10-CM

## 2020-01-04 DIAGNOSIS — R202 Paresthesia of skin: Secondary | ICD-10-CM | POA: Diagnosis not present

## 2020-01-04 DIAGNOSIS — D6959 Other secondary thrombocytopenia: Secondary | ICD-10-CM | POA: Diagnosis present

## 2020-01-04 DIAGNOSIS — O24410 Gestational diabetes mellitus in pregnancy, diet controlled: Secondary | ICD-10-CM | POA: Diagnosis present

## 2020-01-04 DIAGNOSIS — O8612 Endometritis following delivery: Secondary | ICD-10-CM | POA: Diagnosis not present

## 2020-01-04 DIAGNOSIS — D696 Thrombocytopenia, unspecified: Secondary | ICD-10-CM | POA: Diagnosis present

## 2020-01-04 LAB — RESPIRATORY PANEL BY RT PCR (FLU A&B, COVID)
Influenza A by PCR: NEGATIVE
Influenza B by PCR: NEGATIVE
SARS Coronavirus 2 by RT PCR: NEGATIVE

## 2020-01-04 LAB — CBC
HCT: 39.4 % (ref 36.0–46.0)
HCT: 33 % — ABNORMAL LOW (ref 36.0–46.0)
Hemoglobin: 12.6 g/dL (ref 12.0–15.0)
Hemoglobin: 10.8 g/dL — ABNORMAL LOW (ref 12.0–15.0)
MCH: 31 pg (ref 26.0–34.0)
MCH: 31.8 pg (ref 26.0–34.0)
MCHC: 32 g/dL (ref 30.0–36.0)
MCHC: 32.7 g/dL (ref 30.0–36.0)
MCV: 96.8 fL (ref 80.0–100.0)
MCV: 97.1 fL (ref 80.0–100.0)
Platelets: 138 10*3/uL — ABNORMAL LOW (ref 150–400)
Platelets: 131 10*3/uL — ABNORMAL LOW (ref 150–400)
RBC: 4.07 MIL/uL (ref 3.87–5.11)
RBC: 3.4 MIL/uL — ABNORMAL LOW (ref 3.87–5.11)
RDW: 13.9 % (ref 11.5–15.5)
RDW: 14 % (ref 11.5–15.5)
WBC: 10.2 10*3/uL (ref 4.0–10.5)
WBC: 14.8 10*3/uL — ABNORMAL HIGH (ref 4.0–10.5)
nRBC: 0 % (ref 0.0–0.2)
nRBC: 0 % (ref 0.0–0.2)

## 2020-01-04 LAB — APTT: aPTT: 27 seconds (ref 24–36)

## 2020-01-04 LAB — TYPE AND SCREEN
ABO/RH(D): A POS
Antibody Screen: NEGATIVE

## 2020-01-04 LAB — GLUCOSE, CAPILLARY
Glucose-Capillary: 86 mg/dL (ref 70–99)
Glucose-Capillary: 88 mg/dL (ref 70–99)
Glucose-Capillary: 107 mg/dL — ABNORMAL HIGH (ref 70–99)

## 2020-01-04 LAB — PROTIME-INR
INR: 1.1 (ref 0.8–1.2)
Prothrombin Time: 13.5 seconds (ref 11.4–15.2)

## 2020-01-04 LAB — FIBRINOGEN: Fibrinogen: 422 mg/dL (ref 210–475)

## 2020-01-04 MED ORDER — LACTATED RINGERS IV BOLUS
1000.0000 mL | Freq: Once | INTRAVENOUS | Status: AC
  Administered 2020-01-04: 1000 mL via INTRAVENOUS

## 2020-01-04 MED ORDER — FENTANYL CITRATE (PF) 100 MCG/2ML IJ SOLN
100.0000 ug | INTRAMUSCULAR | Status: DC | PRN
  Administered 2020-01-04: 100 ug via INTRAVENOUS

## 2020-01-04 MED ORDER — DIBUCAINE (PERIANAL) 1 % EX OINT: 1.0000 "application " | TOPICAL_OINTMENT | CUTANEOUS | Status: DC | PRN

## 2020-01-04 MED ORDER — LIDOCAINE HCL (PF) 1 % IJ SOLN
INTRAMUSCULAR | Status: DC | PRN
  Administered 2020-01-04: 11 mL via EPIDURAL

## 2020-01-04 MED ORDER — POLYETHYLENE GLYCOL 3350 17 G PO PACK
17.0000 g | PACK | Freq: Every day | ORAL | Status: DC | PRN
  Filled 2020-01-04: qty 1

## 2020-01-04 MED ORDER — FENTANYL-BUPIVACAINE-NACL 0.5-0.125-0.9 MG/250ML-% EP SOLN
12.0000 mL/h | EPIDURAL | Status: DC | PRN
  Filled 2020-01-04: qty 250

## 2020-01-04 MED ORDER — TERBUTALINE SULFATE 1 MG/ML IJ SOLN: 0.2500 mg | Freq: Once | INTRAMUSCULAR | Status: DC | PRN

## 2020-01-04 MED ORDER — DOCUSATE SODIUM 100 MG PO CAPS
100.0000 mg | ORAL_CAPSULE | Freq: Two times a day (BID) | ORAL | Status: DC
  Administered 2020-01-04 – 2020-01-08 (×8): 100 mg via ORAL
  Filled 2020-01-04 (×8): qty 1

## 2020-01-04 MED ORDER — MISOPROSTOL 200 MCG PO TABS: 800.0000 ug | ORAL_TABLET | Freq: Once | ORAL | Status: AC

## 2020-01-04 MED ORDER — EPHEDRINE 5 MG/ML INJ: 10.0000 mg | INTRAVENOUS | Status: DC | PRN

## 2020-01-04 MED ORDER — SOD CITRATE-CITRIC ACID 500-334 MG/5ML PO SOLN: 30.0000 mL | ORAL | Status: DC | PRN

## 2020-01-04 MED ORDER — ONDANSETRON HCL 4 MG/2ML IJ SOLN: 4.0000 mg | INTRAMUSCULAR | Status: DC | PRN

## 2020-01-04 MED ORDER — ACETAMINOPHEN 500 MG PO TABS
1000.0000 mg | ORAL_TABLET | Freq: Three times a day (TID) | ORAL | Status: DC
  Administered 2020-01-04 – 2020-01-08 (×12): 1000 mg via ORAL
  Filled 2020-01-04 (×12): qty 2

## 2020-01-04 MED ORDER — BENZOCAINE-MENTHOL 20-0.5 % EX AERO
1.0000 "application " | INHALATION_SPRAY | CUTANEOUS | Status: DC | PRN
  Administered 2020-01-05 – 2020-01-08 (×2): 1 via TOPICAL
  Filled 2020-01-04 (×2): qty 56

## 2020-01-04 MED ORDER — LACTATED RINGERS IV SOLN
500.0000 mL | Freq: Once | INTRAVENOUS | Status: AC
  Administered 2020-01-04: 500 mL via INTRAVENOUS

## 2020-01-04 MED ORDER — OXYCODONE-ACETAMINOPHEN 5-325 MG PO TABS: 1.0000 | ORAL_TABLET | ORAL | Status: DC | PRN

## 2020-01-04 MED ORDER — DIPHENHYDRAMINE HCL 50 MG/ML IJ SOLN: 12.5000 mg | INTRAMUSCULAR | Status: DC | PRN

## 2020-01-04 MED ORDER — PRENATAL MULTIVITAMIN CH
1.0000 | ORAL_TABLET | Freq: Every day | ORAL | Status: DC
  Administered 2020-01-05 – 2020-01-08 (×4): 1 via ORAL
  Filled 2020-01-04 (×4): qty 1

## 2020-01-04 MED ORDER — PHENYLEPHRINE 40 MCG/ML (10ML) SYRINGE FOR IV PUSH (FOR BLOOD PRESSURE SUPPORT): 80.0000 ug | PREFILLED_SYRINGE | INTRAVENOUS | Status: DC | PRN

## 2020-01-04 MED ORDER — TETANUS-DIPHTH-ACELL PERTUSSIS 5-2.5-18.5 LF-MCG/0.5 IM SUSP: 0.5000 mL | Freq: Once | INTRAMUSCULAR | Status: DC

## 2020-01-04 MED ORDER — LACTATED RINGERS IV SOLN: 500.0000 mL | INTRAVENOUS | Status: DC | PRN

## 2020-01-04 MED ORDER — OXYTOCIN-SODIUM CHLORIDE 30-0.9 UT/500ML-% IV SOLN
1.0000 m[IU]/min | INTRAVENOUS | Status: DC
  Administered 2020-01-04: 2 m[IU]/min via INTRAVENOUS

## 2020-01-04 MED ORDER — MEASLES, MUMPS & RUBELLA VAC IJ SOLR: 0.5000 mL | Freq: Once | INTRAMUSCULAR | Status: DC

## 2020-01-04 MED ORDER — TRANEXAMIC ACID-NACL 1000-0.7 MG/100ML-% IV SOLN
INTRAVENOUS | Status: AC
  Administered 2020-01-04: 1000 mg via INTRAVENOUS
  Filled 2020-01-04: qty 100

## 2020-01-04 MED ORDER — TRANEXAMIC ACID-NACL 1000-0.7 MG/100ML-% IV SOLN: 1000.0000 mg | INTRAVENOUS | Status: AC

## 2020-01-04 MED ORDER — COCONUT OIL OIL: 1.0000 "application " | TOPICAL_OIL | Status: DC | PRN

## 2020-01-04 MED ORDER — LACTATED RINGERS IV SOLN: INTRAVENOUS | Status: DC

## 2020-01-04 MED ORDER — FLEET ENEMA 7-19 GM/118ML RE ENEM: 1.0000 | ENEMA | RECTAL | Status: DC | PRN

## 2020-01-04 MED ORDER — SENNOSIDES-DOCUSATE SODIUM 8.6-50 MG PO TABS
2.0000 | ORAL_TABLET | ORAL | Status: DC
  Administered 2020-01-04 – 2020-01-07 (×4): 2 via ORAL
  Filled 2020-01-04 (×4): qty 2

## 2020-01-04 MED ORDER — DIPHENHYDRAMINE HCL 25 MG PO CAPS: 25.0000 mg | ORAL_CAPSULE | Freq: Four times a day (QID) | ORAL | Status: DC | PRN

## 2020-01-04 MED ORDER — LIDOCAINE HCL (PF) 1 % IJ SOLN
30.0000 mL | INTRAMUSCULAR | Status: AC | PRN
  Administered 2020-01-04: 30 mL via SUBCUTANEOUS
  Filled 2020-01-04: qty 30

## 2020-01-04 MED ORDER — OXYCODONE-ACETAMINOPHEN 5-325 MG PO TABS: 2.0000 | ORAL_TABLET | ORAL | Status: DC | PRN

## 2020-01-04 MED ORDER — MISOPROSTOL 200 MCG PO TABS
ORAL_TABLET | ORAL | Status: AC
  Administered 2020-01-04: 800 ug via RECTAL
  Filled 2020-01-04: qty 4

## 2020-01-04 MED ORDER — OXYTOCIN-SODIUM CHLORIDE 30-0.9 UT/500ML-% IV SOLN
2.5000 [IU]/h | INTRAVENOUS | Status: DC
  Filled 2020-01-04: qty 500

## 2020-01-04 MED ORDER — OXYTOCIN BOLUS FROM INFUSION
333.0000 mL | Freq: Once | INTRAVENOUS | Status: AC
  Administered 2020-01-04: 333 mL via INTRAVENOUS

## 2020-01-04 MED ORDER — SIMETHICONE 80 MG PO CHEW: 80.0000 mg | CHEWABLE_TABLET | ORAL | Status: DC | PRN

## 2020-01-04 MED ORDER — ONDANSETRON HCL 4 MG/2ML IJ SOLN: 4.0000 mg | Freq: Four times a day (QID) | INTRAMUSCULAR | Status: DC | PRN

## 2020-01-04 MED ORDER — IBUPROFEN 600 MG PO TABS
600.0000 mg | ORAL_TABLET | Freq: Four times a day (QID) | ORAL | Status: DC
  Administered 2020-01-04 – 2020-01-08 (×15): 600 mg via ORAL
  Filled 2020-01-04 (×15): qty 1

## 2020-01-04 MED ORDER — WITCH HAZEL-GLYCERIN EX PADS: 1.0000 "application " | MEDICATED_PAD | CUTANEOUS | Status: DC | PRN

## 2020-01-04 MED ORDER — ACETAMINOPHEN 325 MG PO TABS: 650.0000 mg | ORAL_TABLET | ORAL | Status: DC | PRN

## 2020-01-04 MED ORDER — ONDANSETRON HCL 4 MG PO TABS: 4.0000 mg | ORAL_TABLET | ORAL | Status: DC | PRN

## 2020-01-04 MED ORDER — FENTANYL CITRATE (PF) 100 MCG/2ML IJ SOLN
INTRAMUSCULAR | Status: AC
  Filled 2020-01-04: qty 2

## 2020-01-04 MED ORDER — SODIUM CHLORIDE (PF) 0.9 % IJ SOLN
INTRAMUSCULAR | Status: DC | PRN
  Administered 2020-01-04: 12 mL/h via EPIDURAL

## 2020-01-04 NOTE — Anesthesia Preprocedure Evaluation (Signed)
Anesthesia Evaluation  Patient identified by MRN, date of birth, ID band Patient awake    Reviewed: Allergy & Precautions, NPO status , Patient's Chart, lab work & pertinent test results  Airway Mallampati: II  TM Distance: >3 FB Neck ROM: Full    Dental no notable dental hx.    Pulmonary neg pulmonary ROS,    Pulmonary exam normal breath sounds clear to auscultation       Cardiovascular negative cardio ROS Normal cardiovascular exam Rhythm:Regular Rate:Normal     Neuro/Psych negative neurological ROS  negative psych ROS   GI/Hepatic Neg liver ROS, GERD  ,  Endo/Other  diabetes, Gestational  Renal/GU negative Renal ROS  negative genitourinary   Musculoskeletal negative musculoskeletal ROS (+)   Abdominal   Peds negative pediatric ROS (+)  Hematology negative hematology ROS (+)   Anesthesia Other Findings   Reproductive/Obstetrics (+) Pregnancy                             Anesthesia Physical Anesthesia Plan  ASA: III  Anesthesia Plan: Epidural   Post-op Pain Management:    Induction:   PONV Risk Score and Plan:   Airway Management Planned:   Additional Equipment:   Intra-op Plan:   Post-operative Plan:   Informed Consent: I have reviewed the patients History and Physical, chart, labs and discussed the procedure including the risks, benefits and alternatives for the proposed anesthesia with the patient or authorized representative who has indicated his/her understanding and acceptance.       Plan Discussed with:   Anesthesia Plan Comments:         Anesthesia Quick Evaluation

## 2020-01-04 NOTE — Progress Notes (Signed)
Called to bedside by RN as patient passed out. RN reports patient closed eyes and was unresponsive for a couple of seconds but quickly responded with sternal rub. VSS. Interpreter used to communicate with patient. Patient does endorse dizziness since coming up from labor and delivery. RN reports vaginal bleeding has slowed down. Fundus noted to be firm on exam by RN. No shaking or seizure like activity. Suspect most likely in the setting of PPH, acute blood loss anemia. Hgb 12.6>10.8. DIC labs unremarkable. BGL 107. Will administer 1L LR bolus and recheck cbc/cmp at 0000. Patient instructed to eat dinner and encouraged hydration as well. RN notified to check vitals hourly until patient is back to her baseline. Told to notify me for any vital sign or mental status changes. Will continue to monitor.  Alric Seton, MD OB Fellow, Faculty [facility], Center for [facility] 01/04/2020 9:43 PM

## 2020-01-04 NOTE — Anesthesia Procedure Notes (Signed)
Epidural Patient location during procedure: OB Start time: 01/04/2020 12:02 PM End time: 01/04/2020 12:20 PM  Staffing Anesthesiologist: Lowella Curb, MD Performed: other anesthesia staff   Preanesthetic Checklist Completed: patient identified, IV checked, site marked, risks and benefits discussed, surgical consent, monitors and equipment checked, pre-op evaluation and timeout performed  Epidural Patient position: sitting Prep: ChloraPrep Patient monitoring: heart rate, cardiac monitor, continuous pulse ox and blood pressure Approach: midline Location: L2-L3 Injection technique: LOR saline  Needle:  Needle type: Tuohy  Needle gauge: 17 G Needle length: 9 cm Needle insertion depth: 7 cm Catheter type: closed end flexible Catheter size: 20 Guage Catheter at skin depth: 11 cm Test dose: negative  Assessment Events: blood not aspirated, injection not painful, no injection resistance, no paresthesia and negative IV test  Additional Notes Epidural placed by SRNA under direct supervisionReason for block:procedure for pain

## 2020-01-04 NOTE — MAU Note (Signed)
Carol Rangel is a 34 y.o. at [redacted]w[redacted]d here in MAU reporting: contractions since last night and they got closer this morning. Denies bleeding or LOF. +FM  Onset of complaint: last night  Pain score: 10/10  Vitals:   01/04/20 1049  BP: 105/71  Pulse: 81  Resp: 20  Temp: 98.6 F (37 C)  SpO2: 100%     FHT: +FM, EFM applied in room  Lab orders placed from triage: none

## 2020-01-04 NOTE — H&P (Signed)
OBSTETRIC ADMISSION HISTORY AND PHYSICAL  Caprisha Bridgett is a 34 y.o. female G1P0 with IUP at 75w1dby LMP presenting for spontaneous Labor. She reports +FMs, No LOF, no VB, no blurry vision, headaches or peripheral edema, and RUQ pain.  She plans on breast feeding. She is undecided for birth control. [facility]   Dating: By LMP --->  Estimated Date of Delivery: 01/10/20  Sono: _0 , CWD, normal anatomy (except mild bilateral pylectasis--resovled ), Cephalic presentation, 38101B 69% EFW   [place] OB FOLLOW UP  Result Date: 12/16/2019 ----------------------------------------------------------------------  OBSTETRICS REPORT                       (Signed Final 12/16/2019 03:16 pm) ---------------------------------------------------------------------- Patient Info  ID #:       [id number]                         D.O.B.:  [id number](34 yrs)  Name:       NBonney Aid                     Visit Date: 12/16/2019 01:34 pm ---------------------------------------------------------------------- Performed By  Attending:        RTama HighMD        [facility]      [facility]  Performed By:     JGeorgie Chard       [facility]                                                             [facility] ---------------------------------------------------------------------- Orders  #  Description                           Code        Ordered By  1  [place]FM OB FOLLOW UP                   [id number]   YU FANG ----------------------------------------------------------------------  #  Order #                     Accession #                Episode #  1  [id number]                  [id number]  [id number] ---------------------------------------------------------------------- Indications  Pyelectasis of fetus on  prenatal ultrasound    O28.3  (resolved)  Advanced maternal age primigravida 53+,        O35.512  second trimester  Late prenatal care, second trimester           O09.32  [redacted] weeks gestation of pregnancy                Z3A.36  Encounter for other antenatal screening        Z36.2  follow-up ---------------------------------------------------------------------- Fetal Evaluation  Num Of Fetuses:         1  Fetal Heart Rate(bpm):  119  Cardiac Activity:       Observed  Presentation:           Cephalic  Placenta:               Anterior  P. Cord Insertion:      Visualized  Amniotic Fluid  AFI FV:      Within normal limits  AFI Sum(cm)     %Tile       Largest Pocket(cm)  20.81           79          6.97  RUQ(cm)       RLQ(cm)       LUQ(cm)        LLQ(cm)  6.97          4.53          5.44           3.87 ---------------------------------------------------------------------- Biometry  BPD:      88.5  mm     G. Age:  35w 5d         43  %    CI:         71.6   %    70 - 86                                                          FL/HC:      20.1   %    20.1 - 22.1  HC:       333   mm     G. Age:  38w 0d         61  %    HC/AC:      0.97        0.93 - 1.11  AC:      342.7  mm     G. Age:  38w 1d         95  %    FL/BPD:     75.6   %    71 - 87  FL:       66.9  mm     G. Age:  34w 3d          7  %    FL/AC:      19.5   %    20 - 24  HUM:      57.6  mm     G. Age:  33w 3d         12  %  LV:        3.2  mm  Est. FW:    3089  gm  6 lb 13 oz      69  % ---------------------------------------------------------------------- OB History  Maternal Racial/Ethnic Group:   Asian  Gravidity:    1         Term:   0        Prem:   0        SAB:   0  TOP:          0       Ectopic:  0        Living: 0 ---------------------------------------------------------------------- Gestational Age  LMP:           36w 3d        Date:  04/05/19                 EDD:   01/10/20  U/S Today:     36w 4d                                        EDD:   01/09/20   Best:          36w 3d     Det. By:  LMP  (04/05/19)          EDD:   01/10/20 ---------------------------------------------------------------------- Anatomy  Cranium:               Appears normal         Aortic Arch:            Previously seen  Cavum:                 Appears normal         Ductal Arch:            Previously seen  Ventricles:            Appears normal         Diaphragm:              Appears normal  Choroid Plexus:        Previously seen        Stomach:                Appears normal, left                                                                        sided  Cerebellum:            Previously seen        Abdomen:                Appears normal  Posterior Fossa:       Previously seen        Abdominal Wall:         Previously seen  Nuchal Fold:           Not applicable (>27    Cord Vessels:           Previously seen                         wks GA)  Face:  Orbits and profile     Kidneys:                Previously seen                         previously seen  Lips:                  Previously seen        Bladder:                Appears normal  Thoracic:              Appears normal         Spine:                  Previously seen  Heart:                 Previously seen        Upper Extremities:      Previously seen  RVOT:                  Previously seen        Lower Extremities:      Previously seen  LVOT:                  Previously seen  Other:  Heels visualized. Nasal bone visualized. Technically difficult due to          fetal position. ---------------------------------------------------------------------- Impression  Gestational diabetes. Well-controlled on diet .  Fetal growth is appropriate for gestational age .Amniotic fluid  is normal and good fetal activity is seen . ---------------------------------------------------------------------- Recommendations  Follow-up scans as clinically indicated. ----------------------------------------------------------------------                   Tama High, MD Electronically Signed Final Report   12/16/2019 03:16 pm ----------------------------------------------------------------------       Prenatal History/Complications:  Patient Active Problem List   Diagnosis Date Noted  . Normal labor and delivery 01/04/2020  . Gestational thrombocytopenia ([place]) 11/25/2019  . Gestational diabetes mellitus, class A1 10/29/2019  . Gastroesophageal reflux during pregnancy in third trimester, antepartum 10/28/2019  . Language barrier affecting health care 10/28/2019  . Sciatica of right side 09/24/2019  . Supervision of normal first pregnancy, antepartum 08/24/2019  . Late prenatal care 08/24/2019  . Advanced maternal age, 1st pregnancy 08/24/2019  . Malnutrition of moderate degree ([place]) 12/20/2013     Past Medical History: Past Medical History:  Diagnosis Date  . Medical history non-contributory   . Nausea with vomiting 12/20/2013  . Sciatica of right side 09/24/2019    Past Surgical History: Past Surgical History:  Procedure Laterality Date  . BREAST SURGERY Right    ?abscess surgery x2    Obstetrical History: OB History    Gravida  1   Para      Term      Preterm      AB      Living  0     SAB      TAB      Ectopic      Multiple      Live Births              Social History Social History   Socioeconomic History  . Marital status: Single    Spouse name: Not on file  . Number of children: Not on file  . Years of education: Not  on file  . Highest education level: High school graduate  Occupational History  . Occupation: unemployed  Tobacco Use  . Smoking status: Never Smoker  . Smokeless tobacco: Never Used  Vaping Use  . Vaping Use: Never used  Substance and Sexual Activity  . Alcohol use: No  . Drug use: No  . Sexual activity: Yes    Birth control/protection: None  Other Topics Concern  . Not on file  Social History Narrative  . Not on file   Social Determinants of Health    Financial Resource Strain:   . Difficulty of Paying Living Expenses: Not on file  Food Insecurity:   . Worried About Charity fundraiser in the Last Year: Not on file  . Ran Out of Food in the Last Year: Not on file  Transportation Needs:   . Lack of Transportation (Medical): Not on file  . Lack of Transportation (Non-Medical): Not on file  Physical Activity:   . Days of Exercise per Week: Not on file  . Minutes of Exercise per Session: Not on file  Stress:   . Feeling of Stress : Not on file  Social Connections:   . Frequency of Communication with Friends and Family: Not on file  . Frequency of Social Gatherings with Friends and Family: Not on file  . Attends Religious Services: Not on file  . Active Member of Clubs or Organizations: Not on file  . Attends Archivist Meetings: Not on file  . Marital Status: Not on file    Family History: Family History  Problem Relation Age of Onset  . Cancer Maternal Grandmother   . Hypertension Mother     Allergies: No Known Allergies  Medications Prior to Admission  Medication Sig Dispense Refill Last Dose  . Accu-Chek Softclix Lancets lancets Use as instructed 100 each 12   . Blood Glucose Monitoring Suppl (ACCU-CHEK GUIDE) w/Device KIT 1 each by Does not apply route 4 (four) times daily. 1 kit 0   . Elastic Bandages & Supports (COMFORT FIT MATERNITY SUPP SM) MISC 1 Units by Does not apply route daily as needed. 1 each 0   . gabapentin (NEURONTIN) 100 MG capsule One capsule three times a day for 1 week, then take 2 capsules three times a day 180 capsule 3   . glucose blood (ACCU-CHEK GUIDE) test strip Use as instructed (Patient not taking: Reported on 12/01/2019) 100 each 3   . Prenatal Vit-Fe Fumarate-FA (MULTIVITAMIN-PRENATAL) 27-0.8 MG TABS tablet Take 1 tablet by mouth daily at 12 noon.        Review of Systems   All systems reviewed and negative except as stated in HPI  Blood pressure 111/61, pulse (!) 57,  temperature 98.5 F (36.9 C), temperature source Oral, resp. rate 16, height _0  (1.651 m), weight 73 kg, last menstrual period 04/05/2019, SpO2 100 %. General appearance: alert, cooperative and appears stated age Lungs: clear to auscultation bilaterally Heart: regular rate and rhythm Abdomen: soft, non-tender; bowel sounds normal Pelvic: 4/90/-1 Extremities: Homans sign is negative, no sign of DVT Presentation: cephalic Fetal monitoring: Baseline 120, mod variability, pos accels, neg decels  Uterine activity q2-4 min Dilation: 4 Effacement (%): 90 Station: -1 Exam by:: n druebbisch rn   Prenatal labs: ABO, Rh: --/--/A POS (10/19 1107) Antibody: NEG (10/19 1107) Rubella: 3.92 (06/08 0952) RPR: Non Reactive (08/12 0829)  HBsAg: Negative (06/08 0952)  HIV: Non Reactive (08/12 0829)  GBS: Negative/-- (10/01 1149)  1 hr  Glucola 184 Genetic screening  Neg cell free Anatomy US Normal at 36w  Prenatal Transfer Tool  Maternal Diabetes: Yes:  Diabetes Type:  Diet controlled Genetic Screening: Normal Maternal Ultrasounds/Referrals: Normal Fetal Ultrasounds or other Referrals:  None Maternal Substance Abuse:  No Significant Maternal Medications:  None Significant Maternal Lab Results: Group B Strep negative  Results for orders placed or performed during the hospital encounter of 01/04/20 (from the past 24 hour(s))  Respiratory Panel by RT PCR (Flu A&B, Covid) - Nasopharyngeal Swab   Collection Time: 01/04/20 11:07 AM   Specimen: Nasopharyngeal Swab  Result Value Ref Range   SARS Coronavirus 2 by RT PCR NEGATIVE NEGATIVE   Influenza A by PCR NEGATIVE NEGATIVE   Influenza B by PCR NEGATIVE NEGATIVE  CBC   Collection Time: 01/04/20 11:07 AM  Result Value Ref Range   WBC 10.2 4.0 - 10.5 K/uL   RBC 4.07 3.87 - 5.11 MIL/uL   Hemoglobin 12.6 12.0 - 15.0 g/dL   HCT 39.4 36 - 46 %   MCV 96.8 80.0 - 100.0 fL   MCH 31.0 26.0 - 34.0 pg   MCHC 32.0 30.0 - 36.0 g/dL   RDW 13.9 11.5 -  15.5 %   Platelets 138 (L) 150 - 400 K/uL   nRBC 0.0 0.0 - 0.2 %  Type and screen [place]   Collection Time: 01/04/20 11:07 AM  Result Value Ref Range   ABO/RH(D) A POS    Antibody Screen NEG    Sample Expiration      01/07/2020,2359 Performed at [facility], [place] [address], [place], [place] [address]     Patient Active Problem List   Diagnosis Date Noted  . Normal labor and delivery 01/04/2020  . Gestational thrombocytopenia ([place]) 11/25/2019  . Gestational diabetes mellitus, class A1 10/29/2019  . Gastroesophageal reflux during pregnancy in third trimester, antepartum 10/28/2019  . Language barrier affecting health care 10/28/2019  . Sciatica of right side 09/24/2019  . Supervision of normal first pregnancy, antepartum 08/24/2019  . Late prenatal care 08/24/2019  . Advanced maternal age, 1st pregnancy 08/24/2019  . Malnutrition of moderate degree ([place]) 12/20/2013    Assessment/Plan:  Gayanne Prescott is a 34 y.o. G1P0 at 85w1dhere in spontaneous labor. Expectant mgmt at this time.   #Labor: expectant management at this time  #A1GDM: has been overall well controlled. Q4hr cbg   #Pain: Epidural placed  #FWB: Cat I  #ID:  Not indicated #MOF: breast #MOC:undecided #Circ:  Yes   JJanet Berlin MD  01/04/2020, 12:50 PM

## 2020-01-04 NOTE — Discharge Summary (Addendum)
Postpartum Discharge Summary      Patient Name: Carol Rangel DOB: 02-22-1986 MRN: [id number]  Date of admission: 01/04/2020 Delivery date:01/04/2020  Delivering provider: Janet Berlin  Date of discharge: 01/07/2020  Admitting diagnosis: Normal labor and delivery [O80] Intrauterine pregnancy: [redacted]w[redacted]d    Secondary diagnosis:  Active Problems:   Gestational diabetes mellitus, class A1   Gestational thrombocytopenia ([place]   Normal labor and delivery   Postpartum hemorrhage   Vaginal delivery   Acute endometritis  Additional problems: endometritis; bilateral leg paresthesia     Discharge diagnosis: Term Pregnancy Delivered and GDM A1                                              Post partum procedures:IV fereheme X1, antibiotics  Augmentation: N/A Complications: [id number] Hospital course: Onset of Labor With Vaginal Delivery      [name] admitted in Latent Labor on 01/04/2020. Patient had an uncomplicated labor course as follows:  Membrane Rupture Time/Date: 4:42 PM ,01/04/2020   Delivery Method:Vaginal, Spontaneous  Episiotomy: None  Lacerations:  2nd degree  Patient's course notable for postpartum hemorrhage after delivery. She received rectal cytotec, 1g TXA, bleeding remained normal after treatment.  She is ambulating with assistance at times(legs felt "funny" w/decreased sensation for 2 days, now c/t "feeling weak in legs"); had some c/o urinary incontinence, but seemed to be more of dribbling urine when her bladder was full. Ambulating improved overnight; anesthesia saw pt this am and signed off on unlikely an anesthesia related issue, most likely d/t pushing position/prolonged perineal repair requiring stirrups. In house PT consult prior to DC this am. Pt is tolerating a regular diet, passing flatus, and urinating well. Patient is discharged home in stable condition on 01/07/20.   Newborn Data: Birth date:01/04/2020  Birth time:6:15 PM   Gender:Female  Living status:Living  Apgars:9 ,9  Weight:3595 g   Magnesium Sulfate received: No BMZ received: No Rhophylac:N/A MMR:N/A T-DaP:Given prenatally Flu: No Transfusion:No  Physical exam  Vitals:   01/06/20 0508 01/06/20 1345 01/06/20 2124 01/07/20 0525  BP: 120/66 (!) 114/56 119/74 101/61  Pulse: 72 80 78 61  Resp: '16 18 16 16  ' Temp: 98.2 F (36.8 C) 98.7 F (37.1 C) 98.9 F (37.2 C) 98.7 F (37.1 C)  TempSrc: Oral Oral Oral Oral  SpO2:  98% 99%   Weight:      Height:       General: alert, cooperative and no distress Lochia: appropriate Uterine Fundus: firm Incision: N/A DVT Evaluation: No evidence of DVT seen on physical exam. Negative Homan's sign. No cords or calf tenderness. Normal sensation, generalized LE weakness w/4/5 strength  Labs: Lab Results  Component Value Date   WBC 12.0 (H) 01/05/2020   HGB 9.0 (L) 01/05/2020   HCT 27.4 (L) 01/05/2020   MCV 98.2 01/05/2020   PLT 118 (L) 01/05/2020   CMP Latest Ref Rng & Units 01/05/2020  Glucose 70 - 99 mg/dL 97  BUN 6 - 20 mg/dL <5(L)  Creatinine 0.44 - 1.00 mg/dL 0.68  Sodium 135 - 145 mmol/L 134(L)  Potassium 3.5 - 5.1 mmol/L 3.9  Chloride 98 - 111 mmol/L 105  CO2 22 - 32 mmol/L 21(L)  Calcium 8.9 - 10.3 mg/dL 8.8(L)  Total Protein 6.5 - 8.1 g/dL 4.8(L)  Total Bilirubin 0.3 -  1.2 mg/dL 0.6  Alkaline Phos 38 - 126 U/L 101  AST 15 - 41 U/L 21  ALT 0 - 44 U/L 9   Edinburgh Score: Edinburgh Postnatal Depression Scale Screening Tool 01/05/2020  I have been able to laugh and see the funny side of things. 1  I have looked forward with enjoyment to things. 0  I have blamed myself unnecessarily when things went wrong. 2  I have been anxious or worried for no good reason. 2  I have felt scared or panicky for no good reason. 0  Things have been getting on top of me. 0  I have been so unhappy that I have had difficulty sleeping. 2  I have felt sad or miserable. 1  I have been so unhappy that I  have been crying. 0  The thought of harming myself has occurred to me. 0  Edinburgh Postnatal Depression Scale Total 8     After visit meds:  Allergies as of 01/07/2020   No Known Allergies     Medication List    STOP taking these medications   Accu-Chek Guide test strip Generic drug: glucose blood   Accu-Chek Guide w/Device Kit   Accu-Chek Softclix Lancets lancets   Comfort Fit Maternity Supp Sm Misc     TAKE these medications   acetaminophen 500 MG tablet Commonly known as: TYLENOL Take 2 tablets (1,000 mg total) by mouth every 8 (eight) hours.   ferrous sulfate 325 (65 FE) MG tablet Take 1 tablet (325 mg total) by mouth every other day.   gabapentin 100 MG capsule Commonly known as: Neurontin One capsule three times a day for 1 week, then take 2 capsules three times a day   ibuprofen 600 MG tablet Commonly known as: ADVIL Take 1 tablet (600 mg total) by mouth every 6 (six) hours.   multivitamin-prenatal 27-0.8 MG Tabs tablet Take 1 tablet by mouth daily at 12 noon.        Discharge home in stable condition Infant Feeding: Breast Infant Disposition:home with mother Discharge instruction: per After Visit Summary and Postpartum booklet. Activity: Advance as tolerated. Pelvic rest for 6 weeks.  Diet: routine diet   Evaluate pt's bladder/IU at postpartum visit and refer to PT if indicated  Future Appointments: Future Appointments  Date Time Provider [place]  02/16/2020  8:10 AM Tresea Mall, CNM [facility] None  04/06/2020  9:00 AM Kathrynn Ducking, MD [facility] None   Follow up Visit:  Follow-up Information    [place] Follow up on 02/16/2020.   Specialty: Obstetrics and Gynecology Why: for postpartum checkup Contact information: [place] [phone]               Please schedule this patient for a In person postpartum visit in 6 weeks with the following  provider: Any provider. Additional Postpartum F/U:2 hour GTT at 6 weeks Low risk pregnancy complicated by: GDM Delivery mode:  Vaginal, Spontaneous  Anticipated Birth Control:  Unsure   01/07/2020 Christin Fudge, CNM

## 2020-01-04 NOTE — Progress Notes (Signed)
Labor Progress Note Carol Rangel is a 34 y.o. G1P0 at [redacted]w[redacted]d presented for spontaneous labor  S: Feeling cold and shaky. Feels rectal pressure.    O:  BP (!) 102/50   Pulse (!) 57   Temp 98.9 F (37.2 C) (Oral)   Resp 17   Ht 5\' 5"  (1.651 m)   Wt 73 kg   LMP 04/05/2019 (Within Days)   SpO2 100%   BMI 26.78 kg/m  EFM: 120/mod variability/pos accels  CVE: Dilation: 10 Dilation Complete Date: 01/04/20 Dilation Complete Time: 1641 Effacement (%): 90 Cervical Position: Middle Station: 0 Presentation: Vertex Exam by:: Dr. [email or web address]   A&P: 34 y.o. G1P0 [redacted]w[redacted]d here in spontaneous labor.   #Labor: Progressing well. Patient now complete but no urge to push and contractions spaced out. Will start pitocin to optimize pushing. Anticipate SVD.  #Pain: epidural in place  #FWB: Cat I  #GBS Neg   #A1GDM: Most recent CBG 86, continue w q2 checks.   [redacted]w[redacted]d, MD 4:58 PM

## 2020-01-05 ENCOUNTER — Encounter: Payer: Self-pay | Admitting: General Practice

## 2020-01-05 DIAGNOSIS — N710 Acute inflammatory disease of uterus: Secondary | ICD-10-CM | POA: Diagnosis not present

## 2020-01-05 LAB — CBC WITH DIFFERENTIAL/PLATELET
Abs Immature Granulocytes: 0.04 10*3/uL (ref 0.00–0.07)
Basophils Absolute: 0 10*3/uL (ref 0.0–0.1)
Basophils Relative: 0 %
Eosinophils Absolute: 0.2 10*3/uL (ref 0.0–0.5)
Eosinophils Relative: 2 %
HCT: 27.4 % — ABNORMAL LOW (ref 36.0–46.0)
Hemoglobin: 9 g/dL — ABNORMAL LOW (ref 12.0–15.0)
Immature Granulocytes: 0 %
Lymphocytes Relative: 13 %
Lymphs Abs: 1.6 10*3/uL (ref 0.7–4.0)
MCH: 32.3 pg (ref 26.0–34.0)
MCHC: 32.8 g/dL (ref 30.0–36.0)
MCV: 98.2 fL (ref 80.0–100.0)
Monocytes Absolute: 0.9 10*3/uL (ref 0.1–1.0)
Monocytes Relative: 7 %
Neutro Abs: 9.3 10*3/uL — ABNORMAL HIGH (ref 1.7–7.7)
Neutrophils Relative %: 78 %
Platelets: 118 10*3/uL — ABNORMAL LOW (ref 150–400)
RBC: 2.79 MIL/uL — ABNORMAL LOW (ref 3.87–5.11)
RDW: 14.4 % (ref 11.5–15.5)
WBC: 12 10*3/uL — ABNORMAL HIGH (ref 4.0–10.5)
nRBC: 0 % (ref 0.0–0.2)

## 2020-01-05 LAB — CBC
HCT: 30 % — ABNORMAL LOW (ref 36.0–46.0)
HCT: 28.6 % — ABNORMAL LOW (ref 36.0–46.0)
Hemoglobin: 9.9 g/dL — ABNORMAL LOW (ref 12.0–15.0)
Hemoglobin: 9.4 g/dL — ABNORMAL LOW (ref 12.0–15.0)
MCH: 31.8 pg (ref 26.0–34.0)
MCH: 32 pg (ref 26.0–34.0)
MCHC: 33 g/dL (ref 30.0–36.0)
MCHC: 32.9 g/dL (ref 30.0–36.0)
MCV: 96.5 fL (ref 80.0–100.0)
MCV: 97.3 fL (ref 80.0–100.0)
Platelets: 124 10*3/uL — ABNORMAL LOW (ref 150–400)
Platelets: 117 10*3/uL — ABNORMAL LOW (ref 150–400)
RBC: 3.11 MIL/uL — ABNORMAL LOW (ref 3.87–5.11)
RBC: 2.94 MIL/uL — ABNORMAL LOW (ref 3.87–5.11)
RDW: 14.1 % (ref 11.5–15.5)
RDW: 14.2 % (ref 11.5–15.5)
WBC: 14.1 10*3/uL — ABNORMAL HIGH (ref 4.0–10.5)
WBC: 12.8 10*3/uL — ABNORMAL HIGH (ref 4.0–10.5)
nRBC: 0 % (ref 0.0–0.2)
nRBC: 0 % (ref 0.0–0.2)

## 2020-01-05 LAB — COMPREHENSIVE METABOLIC PANEL
ALT: 9 U/L (ref 0–44)
AST: 21 U/L (ref 15–41)
Albumin: 2.1 g/dL — ABNORMAL LOW (ref 3.5–5.0)
Alkaline Phosphatase: 101 U/L (ref 38–126)
Anion gap: 8 (ref 5–15)
BUN: <5 mg/dL — ABNORMAL LOW (ref 6–20)
CO2: 21 mmol/L — ABNORMAL LOW (ref 22–32)
Calcium: 8.8 mg/dL — ABNORMAL LOW (ref 8.9–10.3)
Chloride: 105 mmol/L (ref 98–111)
Creatinine, Ser: 0.68 mg/dL (ref 0.44–1.00)
GFR, Estimated: >60 mL/min (ref >60)
Glucose, Bld: 97 mg/dL (ref 70–99)
Potassium: 3.9 mmol/L (ref 3.5–5.1)
Sodium: 134 mmol/L — ABNORMAL LOW (ref 135–145)
Total Bilirubin: 0.6 mg/dL (ref 0.3–1.2)
Total Protein: 4.8 g/dL — ABNORMAL LOW (ref 6.5–8.1)

## 2020-01-05 LAB — GLUCOSE, CAPILLARY: Glucose-Capillary: 85 mg/dL (ref 70–99)

## 2020-01-05 LAB — RPR: RPR Ser Ql: NONREACTIVE

## 2020-01-05 MED ORDER — CLINDAMYCIN PHOSPHATE 900 MG/50ML IV SOLN
900.0000 mg | Freq: Three times a day (TID) | INTRAVENOUS | Status: DC
  Administered 2020-01-05 – 2020-01-07 (×6): 900 mg via INTRAVENOUS
  Filled 2020-01-05 (×9): qty 50

## 2020-01-05 MED ORDER — LACTATED RINGERS IV BOLUS
1000.0000 mL | Freq: Once | INTRAVENOUS | Status: AC
  Administered 2020-01-05: 1000 mL via INTRAVENOUS

## 2020-01-05 MED ORDER — GENTAMICIN SULFATE 40 MG/ML IJ SOLN
5.0000 mg/kg | INTRAVENOUS | Status: DC
  Administered 2020-01-06 – 2020-01-07 (×2): 320 mg via INTRAVENOUS
  Filled 2020-01-05 (×3): qty 8

## 2020-01-05 MED ORDER — SODIUM CHLORIDE 0.9 % IV SOLN
510.0000 mg | Freq: Once | INTRAVENOUS | Status: AC
  Administered 2020-01-05: 510 mg via INTRAVENOUS
  Filled 2020-01-05: qty 17

## 2020-01-05 MED ORDER — INFLUENZA VAC SPLIT QUAD 0.5 ML IM SUSY
0.5000 mL | PREFILLED_SYRINGE | INTRAMUSCULAR | Status: DC
  Filled 2020-01-05: qty 0.5

## 2020-01-05 MED ORDER — OXYCODONE HCL 5 MG PO TABS
5.0000 mg | ORAL_TABLET | Freq: Four times a day (QID) | ORAL | Status: DC | PRN
  Administered 2020-01-05: 5 mg via ORAL
  Filled 2020-01-05: qty 1

## 2020-01-05 NOTE — Lactation Note (Signed)
This note was copied from a baby's chart. Lactation Consultation Note  Patient Name: Carol Rangel [id number] Date: 01/05/2020 Reason for consult: Initial assessment;1st time breastfeeding;Mother's request;Difficult latch P1, 8 hour female term infant. Mom has door knob shaped nipples that are round in appearance. Per mom. She had breast surgery on her right breast at age 34 years to remove cyst, mom has scar below nipple . Per mom she did see breast changes in pregnancy but is concern about producing milk on her right breast, when teaching hand expression colostrum was not present at this time from either breast.  Infant had one stools and LC assisted dad in changing two void diapers while in room.  Per mom, infant has not been latching well at the breast. Mom's current feeding choice is breast and formula feeding. Mom receives [facility] in [place] no doesn't have a breastpump at home. LC gave mom DEBP kit to help stimulate, establish milk supply and due to infant not latching well at the breast nor is colostrum present when doing hand expression.  Mom latched infant on her left breast using the football hold position, infant was on and off breast for 7 minutes did not sustain latch. Mom will continue working towards latching infant at breast and doing lots of STS with infant. Mom knows to BF infant according to hunger cues, 8 to 12+ time within 24 hours, STS. Infant was supplemented with 5 mls of formula Dory Horn with iron 20 kcal) using curve tip syringe. Mom will continue to latch infant at breast for each feeding and will supplement infant with formula after wards based on infant's age/ hours of life. Mom will pump every 3 hours for 15 minutes on initial setting to help establish her milk supply. Mom knows to call RN or [facility] for assistance with latching infant to breast if needed.  Mom made aware of O/P services, breastfeeding support groups, community resources, and our phone # for  post-discharge questions.   Maternal Data Formula Feeding for Exclusion: No Has patient been taught Hand Expression?: Yes Does the patient have breastfeeding experience prior to this delivery?: No  Feeding Feeding Type: Breast Fed  LATCH Score Latch: Repeated attempts needed to sustain latch, nipple held in mouth throughout feeding, stimulation needed to elicit sucking reflex.  Audible Swallowing: A few with stimulation  Type of Nipple: Everted at rest and after stimulation  Comfort (Breast/Nipple): Soft / non-tender  Hold (Positioning): Assistance needed to correctly position infant at breast and maintain latch.  LATCH Score: 7  Interventions Interventions: Breast feeding basics reviewed;Breast compression;Assisted with latch;Adjust position;Support pillows;Skin to skin;DEBP;Hand pump;Position options;Breast massage;Hand express;Expressed milk  Lactation Tools Discussed/Used Tools: Pump Breast pump type: Double-Electric Breast Pump;Manual WIC Program: Yes Pump Review: Setup, frequency, and cleaning;Milk Storage Initiated by:: Vicente Serene, IBCLC Date initiated:: 01/05/20   Consult Status Consult Status: Follow-up Date: 01/05/20 Follow-up type: In-patient    Vicente Serene 01/05/2020, 2:29 AM

## 2020-01-05 NOTE — Progress Notes (Signed)
Post Partum Day 1 Subjective:  Patient is doing well without complaints. Her lightheadedness is improved since last night. No further episodes of passing out. Ambulating without difficulty. Voiding and passing flatus. Tolerating PO. Abdominal pain improved, well controlled with tylenol/ibuprofen. Vaginal bleeding decreased.  Objective: Blood pressure (!) 92/51, pulse 69, temperature 97.8 F (36.6 C), temperature source Oral, resp. rate 18, height 5\' 5"  (1.651 m), weight 73 kg, last menstrual period 04/05/2019, SpO2 100 %, unknown if currently breastfeeding.  Physical Exam:  General: alert, cooperative and no distress Lochia: appropriate Uterine Fundus: firm Incision: n/a DVT Evaluation: No evidence of DVT seen on physical exam.  Recent Labs    01/04/20 1958 01/05/20 0145  HGB 10.8* 9.9*  HCT 33.0* 30.0*    Assessment/Plan: PPD#1 VD with PPH (01/07/20) and 2nd degree lac  -doing well, meeting postpartum milestones  -counseled on contraception options, still deciding  -would like to breastfeed, RN notified to consult lactation  -would like circ, ordered/consented  #Acute blood loss anemia  -symptomatic last night, symptoms currently improved  -hgb 10.8>9.9>9.4, feraheme orderedx1  #A1GDM  -fasting cbg 85  -post partum 2hr gtt  #gestational thrombocytopenia  -asymptomatic  -  #Language barrier  -offered Bermese interpreter on iPad, patient declines  Plan to discharge tomorrow given time of delivery.   LOS: 1 day   [id number] 01/05/2020, 5:53 AM

## 2020-01-05 NOTE — Anesthesia Postprocedure Evaluation (Signed)
Anesthesia Post Note  Patient: Physiological scientist  Procedure(s) Performed: AN AD HOC LABOR EPIDURAL     Patient location during evaluation: Mother Baby Anesthesia Type: Epidural Level of consciousness: awake and alert Pain management: pain level controlled Vital Signs Assessment: post-procedure vital signs reviewed and stable Respiratory status: spontaneous breathing, nonlabored ventilation and respiratory function stable Cardiovascular status: stable Postop Assessment: no headache, no backache and epidural receding Anesthetic complications: no Comments: Pt stated left leg is still slightly numb, but she has been able to stand and walk to restroom. Notified patient to call if numbness remains.   No complications documented.  Last Vitals:  Vitals:   01/05/20 0420 01/05/20 0528  BP: (!) 96/50 (!) 92/51  Pulse: 76 69  Resp: 18 18  Temp: 36.8 C 36.6 C  SpO2:      Last Pain:  Vitals:   01/05/20 0528  TempSrc: Oral  PainSc: 4    Pain Goal:                   Junious Silk

## 2020-01-05 NOTE — Progress Notes (Signed)
Post Partum Day 1  Subjective: Called to room by RN, pt feeling hot, weak, with pain in her legs.   Pt denies any respiratory symptoms including cough, sore throat, congestion, or SOB.  She denies any dysuria or breast pain.  She reports pumping x1 in 24 hours and is mostly bottle feeding baby.    Objective: Blood pressure 111/66, pulse 92, temperature 98.3 F (36.8 C), temperature source Oral, resp. rate 18, height 5\' 5"  (1.651 m), weight 73 kg, last menstrual period 04/05/2019, SpO2 100 %, unknown if currently breastfeeding.  Patient Vitals for the past 24 hrs:  BP Temp Temp src Pulse Resp SpO2  01/05/20 1538 -- (!) 100.8 F (38.2 C) Axillary -- -- --  01/05/20 1428 111/66 (!) 100.7 F (38.2 C) Oral 92 18 100 %  01/05/20 1208 103/63 -- -- 78 -- --  01/05/20 0816 (!) 98/57 -- -- 71 -- --  01/05/20 0528 (!) 92/51 97.8 F (36.6 C) Oral 69 18 --  01/05/20 0420 (!) 96/50 98.2 F (36.8 C) Oral 76 18 --   Physical Exam:  General: alert, cooperative and mild distress  HEART: normal rate, heart sounds, regular rhythm RESP: normal effort, lung sounds clear and equal bilaterally Lochia: appropriate amount, notable odor with lochia on exam Uterine Fundus: firm, tender to palpation Breast:  Breasts soft, no pain on exam Incision: n/a DVT Evaluation: No evidence of DVT seen on physical exam.  Recent Labs    01/05/20 0145 01/05/20 0518  HGB 9.9* 9.4*  HCT 30.0* 28.6*    Assessment/Plan: 34 y.o. s/p SVD at [redacted]w[redacted]d PPH EBL 1300 Endometritis  No evidence of mastitis, no clear signs of UTI.  Urine culture sent.  Start Clindamycin and Gentamycin IV for acute endometritis.  IV fluid bolus and Tylenol given.    LOS: 1 day   [redacted]w[redacted]d 01/05/2020, 6:07 PM

## 2020-01-05 NOTE — Progress Notes (Signed)
Pharmacy Antibiotic Note  Carol Rangel is a 34 y.o. female admitted on 01/04/2020 at [redacted]w[redacted]d for SOL.Pt is now PPD #1 s/p vaginal delivery w/ PPH.  Pharmacy has been consulted for Gentamicin dosing for Triple I.  Plan: Gentamicin 320mg  (5mg /kg) IV q24h.  Height: 5\' 5"  (165.1 cm) Weight: 73 kg (160 lb 15 oz) IBW/kg (Calculated) : 57  Dosing/ ABW: 63.4kg  Temp (24hrs), Avg:99.1 F (37.3 C), Min:97.8 F (36.6 C), Max:100.8 F (38.2 C)  Recent Labs  Lab 01/04/20 1107 01/04/20 1958 01/05/20 0145 01/05/20 0518  WBC 10.2 14.8* 14.1* 12.8*  CREATININE  --   --  0.68  --     Estimated Creatinine Clearance: 97.3 mL/min (by C-G formula based on SCr of 0.68 mg/dL).    No Known Allergies  Antimicrobials this admission: Clindamycin 900mg  IV q8h  10/20 >>    Thank you for allowing pharmacy to be a part of this patient's care.  01/07/20 01/05/2020 5:54 PM

## 2020-01-06 ENCOUNTER — Encounter: Payer: Medicaid Other | Admitting: Obstetrics and Gynecology

## 2020-01-06 NOTE — Progress Notes (Signed)
Post Partum Day 1 Subjective:  Patient is doing well without complaints. Ambulating without difficulty. Voiding and passing flatus. Tolerating PO. Abdominal pain improved, but she continues to endorse abdominal pain. Vaginal bleeding decreased. Had fever 100.8 yesterday afternoon at 1530. She denies dysuria, hematuria, urgency, frequency. No respiratory symptoms. No skin infections.  Objective: Blood pressure 120/66, pulse 72, temperature 98.2 F (36.8 C), temperature source Oral, resp. rate 16, height 5\' 5"  (1.651 m), weight 73 kg, last menstrual period 04/05/2019, SpO2 98 %, unknown if currently breastfeeding.  Physical Exam:  General: alert, cooperative and no distress Lochia: appropriate Uterine Fundus: firm but tender to palpation Incision: n/a DVT Evaluation: No evidence of DVT seen on physical exam.  Recent Labs    01/05/20 0518 01/05/20 1946  HGB 9.4* 9.0*  HCT 28.6* 27.4*    Assessment/Plan: PPD#2 VD with PPH (01/07/20) and 2nd degree lac  -doing well, meeting postpartum milestones  -counseled on contraception options, still deciding  -would like to breastfeed, will continue to work with lactation  -would like circ, ordered/consented  #Postpartum endometritis  -temp yesterday, tmax 100.8   -fundal tenderness  -amp/gent  #Acute blood loss anemia  -symptomatic last night on PPD#0  -hgb 10.8>9.9>9.4, s/p feraheme x1, doing well  #A1GDM  -fasting cbg 85  -post partum 2hr gtt  #gestational thrombocytopenia  -asymptomatic  -  #Language barrier  -offered Bermese interpreter on iPad, patient declines  Plan to discharge tomorrow if patient remains afebrile and fever improves.   LOS: 2 days   [id number] 01/06/2020, 8:32 AM

## 2020-01-07 ENCOUNTER — Inpatient Hospital Stay (HOSPITAL_COMMUNITY): Payer: Medicaid Other

## 2020-01-07 DIAGNOSIS — R29898 Other symptoms and signs involving the musculoskeletal system: Secondary | ICD-10-CM

## 2020-01-07 LAB — URINE CULTURE
Culture: NO GROWTH
Special Requests: NORMAL

## 2020-01-07 IMAGING — MR MR LUMBAR SPINE W/O CM
4 of 5 series · 19 of 48 positions shown · non-contrast
Comparison: None.

CLINICAL DATA: Myelopathy, acute or progressive. Recent lumbar
epidural placement. Evaluate for epidural hematoma. Postpartum.

EXAM:
MRI LUMBAR SPINE WITHOUT CONTRAST
TECHNIQUE: Multiplanar, multisequence MR imaging of the lumbar spine was
performed. No intravenous contrast was administered.

[Series 3: T2 · sagittal · 4.0mm · 0.55mm/px · 6 of 14 slices shown (1 of 2)]
[im 1/14]
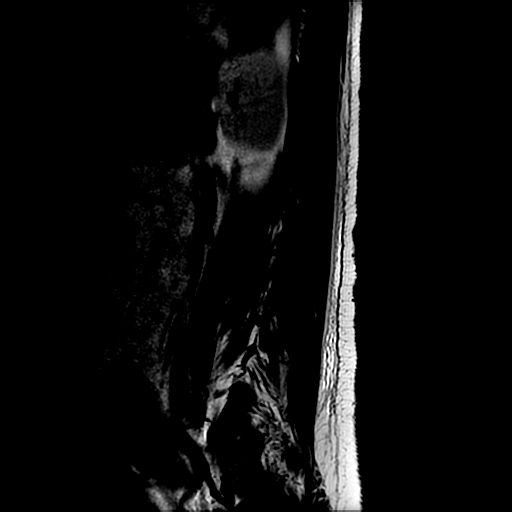
[im 3/14]
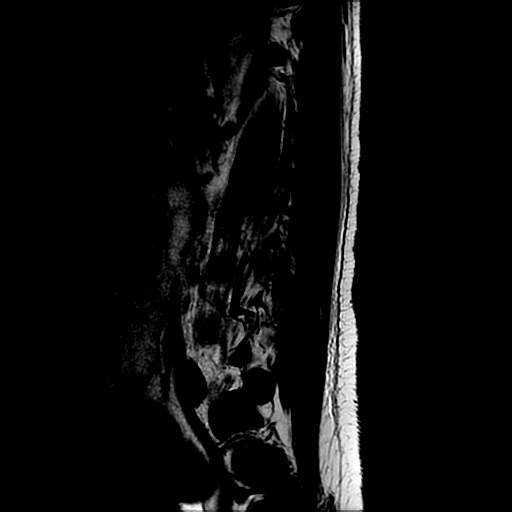
[im 6/14]
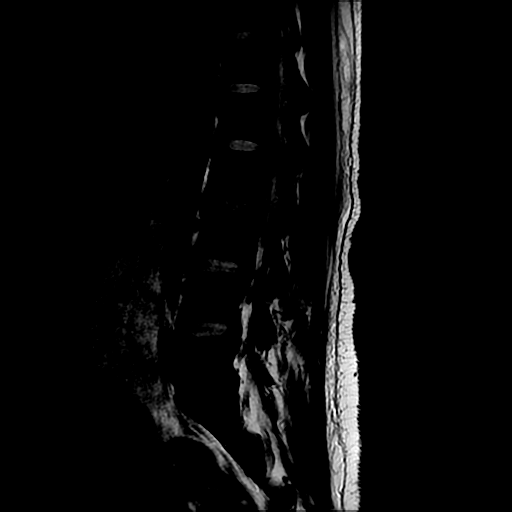
[im 8/14]
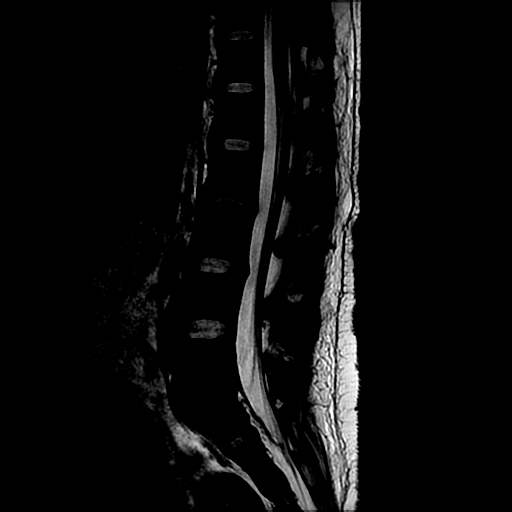
[im 11/14]
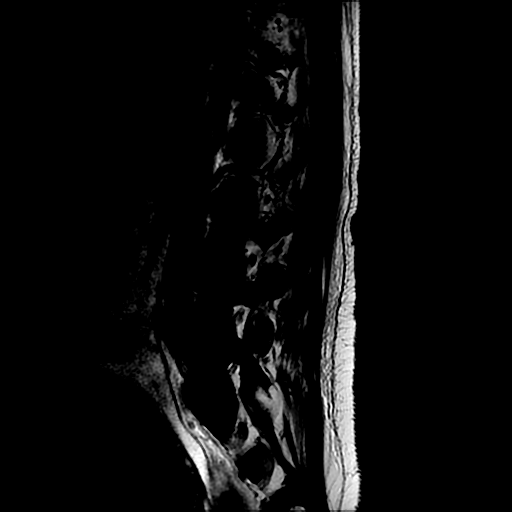
[im 14/14]
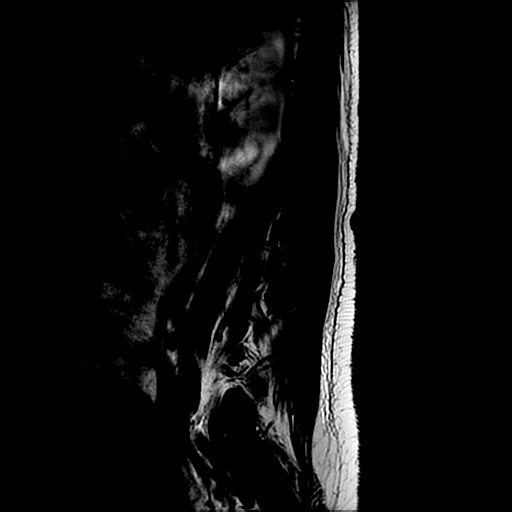

[Series 5: T1 · sagittal · 4.0mm · 0.55mm/px · 3 of 14 slices shown (1 of 2)]
[im 3/14]
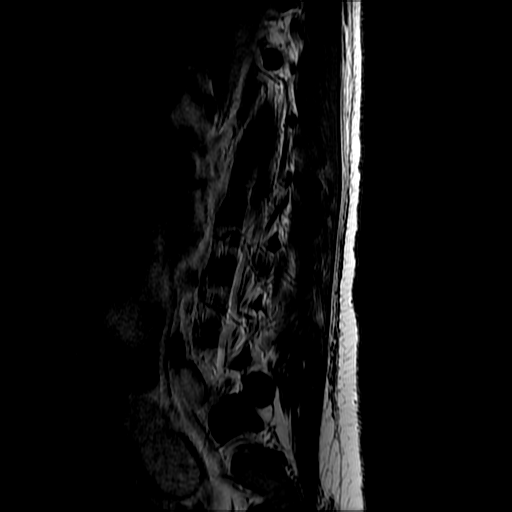
[im 8/14]
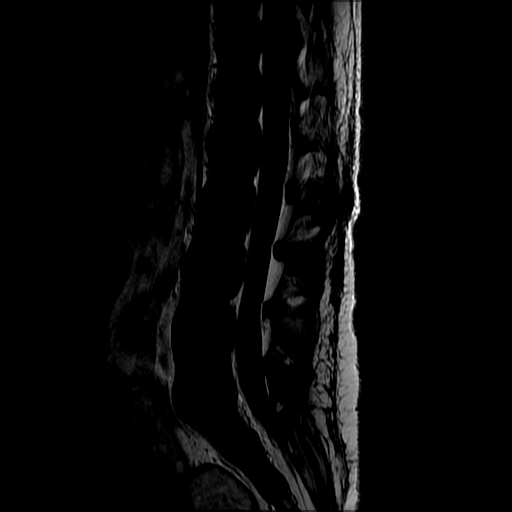
[im 14/14]
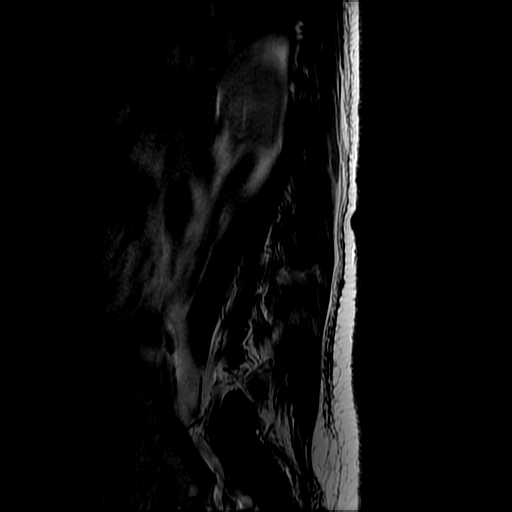

[Series 6: T2 · axial · 4.0mm · 0.39mm/px · z∈[-92,+74]mm · 7 of 37 slices shown (2 of 2)]
[im 1/37]
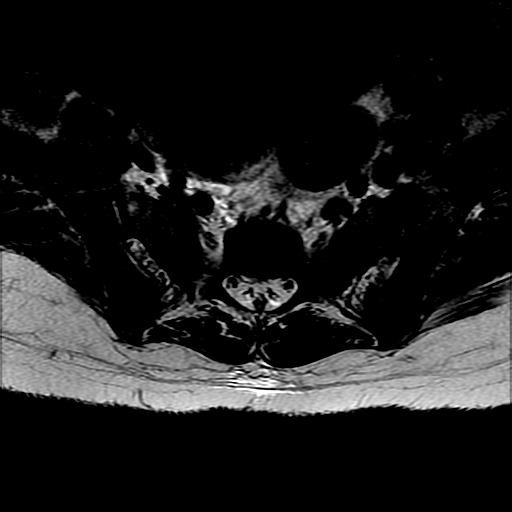
[im 6/37]
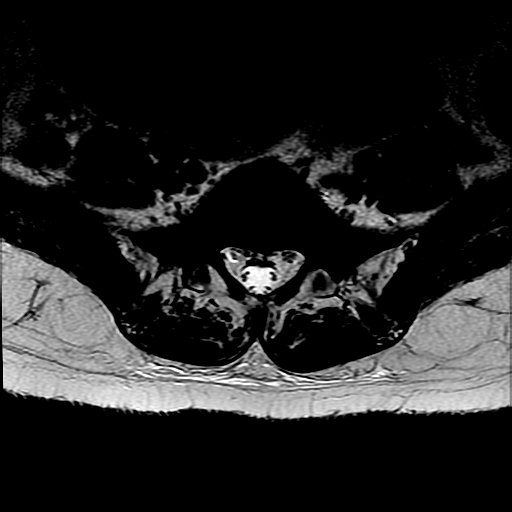
[im 11/37]
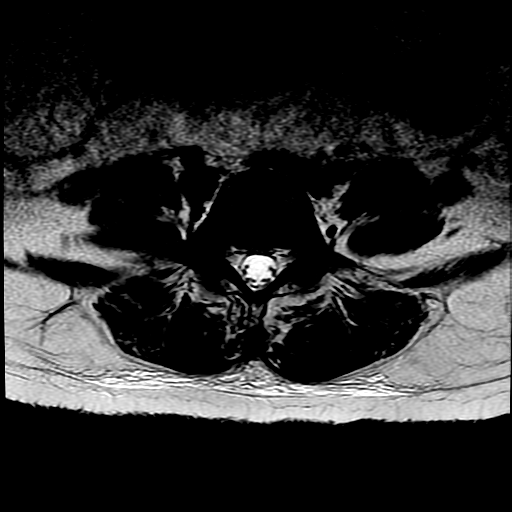
[im 16/37]
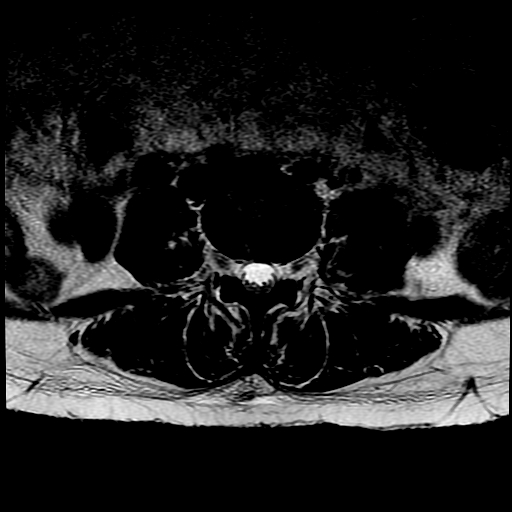
[im 19/37]
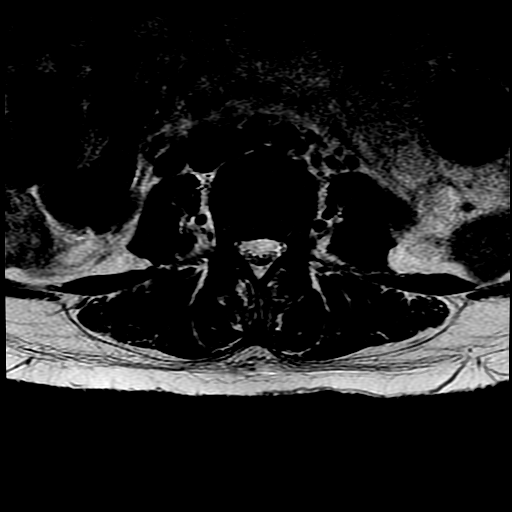
[im 21/37]
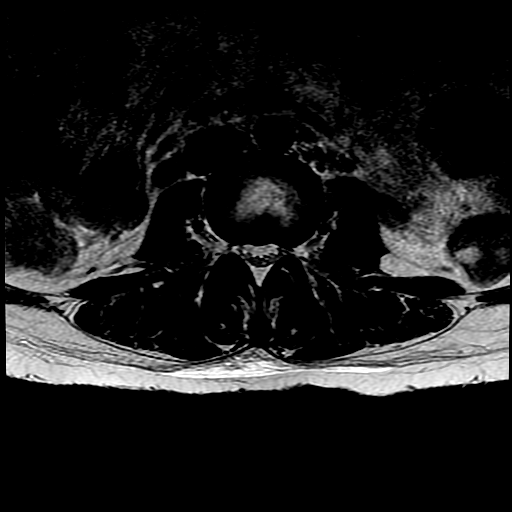
[im 31/37]
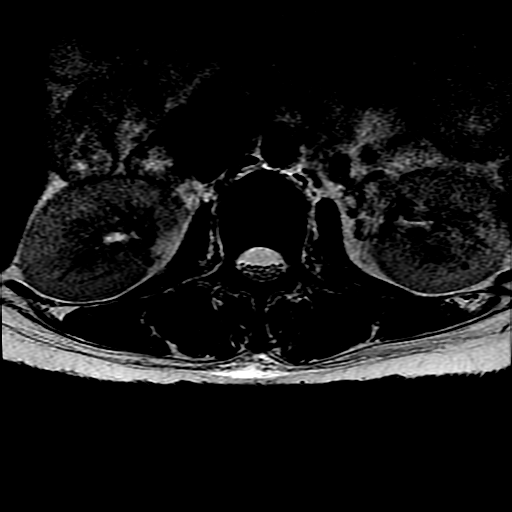

[Series 7: T1 · axial · 4.0mm · 0.39mm/px · z∈[-67,+74]mm · 3 of 37 slices shown (2 of 2)]
[im 6/37]
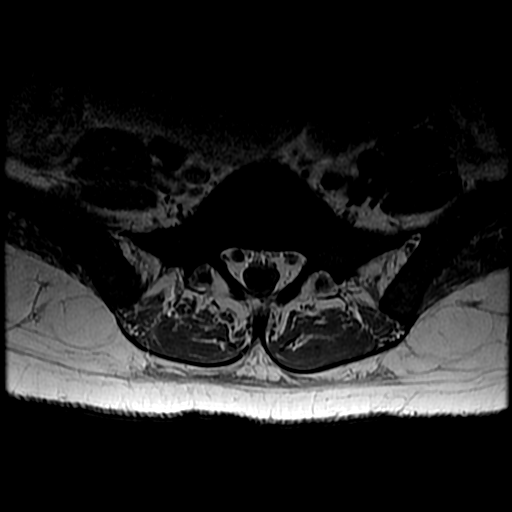
[im 19/37]
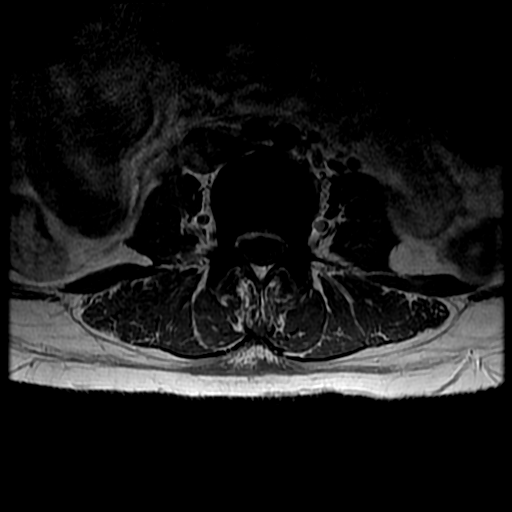
[im 31/37]
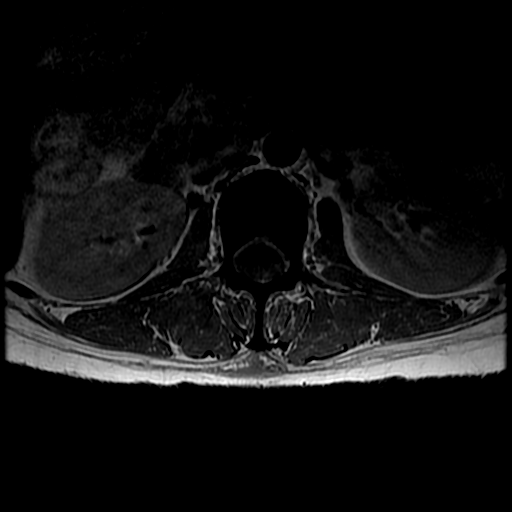

[19 of 48 positions shown; findings below may reference images not displayed]

FINDINGS: Segmentation: The inferior-most fully formed intervertebral disc is
labeled L5-S1.

Alignment:  Physiologic.

Vertebrae: Diffuse T1 hypointensity of the marrow. No focal marrow
signal abnormality. Vertebral body height is maintained.

Conus medullaris, cauda equina, and canal: Conus extends to the
approximately T12-L1 level. Conus and cauda equina appear normal. No
abnormal fluid collection within the canal identified

Paraspinal and other soft tissues: Post partum uterus, partially
imaged.

Disc levels:

T11-T12 and T12-L1 are only imaged on sagittal sequences without
evidence of significant canal or foraminal stenosis.

T12-L1: No significant disc protrusion, foraminal stenosis, or canal
stenosis.

L1-L2: No significant disc protrusion, foraminal stenosis, or canal
stenosis.

L2-L3: No significant disc protrusion, foraminal stenosis, or canal
stenosis.

L3-L4: Tiny central disc protrusion with annular fissure. No
significant canal or foraminal stenosis.

L4-L5: No significant disc protrusion, foraminal stenosis, or canal
stenosis.

L5-S1: Mild disc desiccation. Small central disc protrusion without
significant canal or foraminal stenosis.
IMPRESSION: 1. No epidural hematoma.
2. Mild degenerative changes without significant canal or foraminal
stenosis.
3. Diffuse T1 hypointensity of the bone marrow, which is nonspecific
but can be seen with chronic hypoxia (such as in smokers), chronic
anemia, or lymphoproliferative disorders.

## 2020-01-07 MED ORDER — FERROUS SULFATE 325 (65 FE) MG PO TABS: 325.0000 mg | ORAL_TABLET | ORAL | 2 refills | Status: DC

## 2020-01-07 MED ORDER — LORAZEPAM 0.5 MG PO TABS
0.5000 mg | ORAL_TABLET | Freq: Once | ORAL | Status: AC
  Administered 2020-01-07: 0.5 mg via ORAL
  Filled 2020-01-07: qty 1

## 2020-01-07 MED ORDER — ACETAMINOPHEN 500 MG PO TABS: 1000.0000 mg | ORAL_TABLET | Freq: Three times a day (TID) | ORAL | 0 refills | Status: DC

## 2020-01-07 MED ORDER — IBUPROFEN 600 MG PO TABS: 600.0000 mg | ORAL_TABLET | Freq: Four times a day (QID) | ORAL | 0 refills | Status: DC

## 2020-01-07 NOTE — Evaluation (Signed)
Physical Therapy Evaluation Patient Details Name: Carol Rangel MRN: [id number] DOB: 1985-07-31 Today's Date: 01/07/2020   History of Present Illness  Pt is 34 yo female s/p labor and delivery on 01/04/20.  She did have postpartum hemorrhage and received rectal cytotec.  Pt has had c/o weak legs and urinary incontinence.  Pt did receive epidural and aneshesiology has reviewed the pt in regard to current complaints and felt related to postioning during labor as weakness is generalized, sensation intact, and no c/o back pain. PT was asked to evaluate pt.  Clinical Impression   Pt admitted with above diagnosis. At PT evaluation she was found to have profound weakness throughout bil LE - hips, knees, and ankles with MMT of 2/5.  Pt was able to ambulate via sliding feet, locking knees, and use of UEs on RW.  Spoke with RN who reports symptoms appear to have worsened.  Pt with sensation intact and per OB reflexes intact.  She had no complaints of back or pelvic pain (did have some pain from tear during labor).  Pt did have urinary incontinence.  She required min A for transfers and mod A to stand.  She does have a long history of R LE/hip pain and weakness that has been assessed by neurologist in past but current weakness is significantly more than her past weakness.   Spoke with OB and anestheologist regarding PT findings and they are planning further work up including CT and possible neurology involvement.  Pt currently with functional limitations due to the deficits listed below (see PT Problem List). Pt will benefit from skilled PT to increase their independence and safety with mobility to allow discharge to the venue listed below.       Follow Up Recommendations Home health PT;Supervision/Assistance - 24 hour (HHPT if possible - if not recommend outpt PT with women's health speciality (one option at [facility] location))    Equipment Recommendations  Rolling walker with 5" wheels;3in1 (PT)     Recommendations for Other Services       Precautions / Restrictions Precautions Precautions: Fall      Mobility  Bed Mobility Overal bed mobility: Needs Assistance Bed Mobility: Supine to Sit;Sit to Supine     Supine to sit: Min assist Sit to supine: Min assist   General bed mobility comments: min A for legs and increased time    Transfers Overall transfer level: Needs assistance Equipment used: Rolling walker (2 wheeled) Transfers: Sit to/from Stand Sit to Stand: Mod assist         General transfer comment: sit to stand x 2 with mod A  Ambulation/Gait Ambulation/Gait assistance: Min assist Gait Distance (Feet): 80 Feet Assistive device: Rolling walker (2 wheeled) Gait Pattern/deviations: Step-to pattern;Shuffle;Decreased dorsiflexion - left;Decreased dorsiflexion - right Gait velocity: Significantly decreased   General Gait Details: Pt very weak in standing and ambulates with foot drop by sliding feet and in stance locks knees in extension/hyperextension and use of UE to manage weight.  Pt wanting to do more to practice and improve walking but fatigued easily and needed 2 rest breaks during ambulation.  No complaints of pelivc or back pain with weight bearing.  Stairs            Wheelchair Mobility    Modified Rankin (Stroke Patients Only)       Balance Overall balance assessment: Needs assistance   Sitting balance-Leahy Scale: Good Sitting balance - Comments: Used UEs but due to pain from perianal tear and for pressure  relief   Standing balance support: Bilateral upper extremity supported Standing balance-Leahy Scale: Poor Standing balance comment: Required RW or mod A due to weakness                             Pertinent Vitals/Pain Pain Assessment: Faces Faces Pain Scale: Hurts little more Pain Location: Denies pain in back , legs , pelvis.  Current pain in perianal area from tear during labor. Pain Descriptors / Indicators:  Grimacing Pain Intervention(s): Limited activity within patient's tolerance    Home Living Family/patient expects to be discharged to:: Private residence Living Arrangements: Spouse/significant other Available Help at Discharge: Family;Friend(s);Available 24 hours/day Type of Home: House Home Access: Level entry (normally uses 5 steps with bil rails but can go in back door that is level)     Home Layout: One level Home Equipment: None      Prior Function Level of Independence: Independent         Comments: Does report long standing hx of R hip pain that increased during pregnancy.Saw neurologist and reports nerve conduction study intact but other testing limited due to pregnancy.  Reports current weakness is new and different than this hx of pain and weakness.     Hand Dominance        Extremity/Trunk Assessment   Upper Extremity Assessment Upper Extremity Assessment: Overall WFL for tasks assessed    Lower Extremity Assessment Lower Extremity Assessment: LLE deficits/detail;RLE deficits/detail RLE Deficits / Details: ROM: WFL.  MMT: 2/5 hip, 2/5 knee flexion and ext, and 2/5 ankle dorsiflexion and extension.  Sensation intact.  Did not check reflexes - OB reports intact. RLE Sensation: WNL LLE Deficits / Details: ROM: WFL.  MMT: 2/5 hip, 2/5 knee flexion and ext, and 2/5 ankle dorsiflexion and extension.  Sensation intact.  Did not check reflexes - OB reports intact. LLE Sensation: WNL    Cervical / Trunk Assessment Cervical / Trunk Assessment: Normal  Communication   Communication: No difficulties (Speaks Burmese but was able to communicate in [place] without difficulty)  Cognition Arousal/Alertness: Awake/alert Behavior During Therapy: WFL for tasks assessed/performed Overall Cognitive Status: Within Functional Limits for tasks assessed                                        General Comments General comments (skin integrity, edema, etc.): Pt  with urinary incontinence.  After PT evaluation spoke with RN regarding profound weakness.  RN reports that pt does seem worse than yesterday in regards to weakness, walking, and urinary incontinence.  Spoke with OB and then anesthologist regarding PT findings and profound weakness - they are planning further workup.  Also, spoke with social work regarding d/c needs of RW, BSC, and HHPT if possible if not then outpt PT (recommend [facility]'s [facility] specility - available at [facility] location)    Exercises Other Exercises Other Exercises: Educated on ankle pumps, quad sets, gluteal squeezes, and AROM as able.   Assessment/Plan    PT Assessment Patient needs continued PT services  PT Problem List Decreased strength;Decreased mobility;Decreased safety awareness;Decreased activity tolerance;Decreased balance;Decreased knowledge of use of DME       PT Treatment Interventions DME instruction;Therapeutic activities;Gait training;Therapeutic exercise;Patient/family education;Stair training;Balance training;Functional mobility training;Neuromuscular re-education    PT Goals (Current goals can be found in the Care Plan section)  Acute Rehab PT Goals Patient  Stated Goal: get stronger and be able to walk and take care of baby PT Goal Formulation: With patient/family Time For Goal Achievement: 01/21/20 Potential to Achieve Goals: Good Additional Goals Additional Goal #1: Will increase LE strength to 4/5 throughout for improved gait and stair.s    Frequency Min 3X/week   Barriers to discharge        Co-evaluation               AM-PAC PT "6 Clicks" Mobility  Outcome Measure Help needed turning from your back to your side while in a flat bed without using bedrails?: A Little Help needed moving from lying on your back to sitting on the side of a flat bed without using bedrails?: A Little Help needed moving to and from a bed to a chair (including a wheelchair)?: A Little Help needed standing up  from a chair using your arms (e.g., wheelchair or bedside chair)?: A Lot Help needed to walk in hospital room?: A Little Help needed climbing 3-5 steps with a railing? : A Lot 6 Click Score: 16    End of Session Equipment Utilized During Treatment: Gait belt Activity Tolerance: Patient tolerated treatment well Patient left: in bed;with call bell/phone within reach;with family/visitor present Nurse Communication: Mobility status PT Visit Diagnosis: Muscle weakness (generalized) (M62.81);Other abnormalities of gait and mobility (R26.89)    Time: 0916-1000 PT Time Calculation (min) (ACUTE ONLY): 44 min   Charges:   PT Evaluation $PT Eval Moderate Complexity: 1 Andi Hence, PT Acute Rehab Services Pager [phone] [facility] Re[facility] [phone]    Rayetta Humphrey 01/07/2020, 11:13 AM

## 2020-01-07 NOTE — Lactation Note (Addendum)
This note was copied from a baby's chart. Lactation Consultation Note  Patient Name: Carol Rangel YVOPF'Y Date: 01/06/20   Mother declined Burmese interpreter and spoke [place].  She stated the language choice was made when she passed out after delivery. Mother states she had surgery on her R breast at base of nipple and areola as a teen.   Mother concerned about her milk supply and has primarily been formula feed with occasional breastfeeding sessions.  She states baby does not seem interested in breastfeeding. Reviewed volume guidelines with mother and encouraged her to offer breast each time prior to formula feeding when baby cues.   Carol Span RN shadowing LC demonstrated hand expression bilaterally with glistening expressed on L side.   Suggest mother use DEBP to stimulate her milk supply.  Encouraged mother to call this evening if she would like assistance with latching.        Maternal Data    Feeding    LATCH Score                   Interventions    Lactation Tools Discussed/Used     Consult Status      Carol Rangel 01/07/2020, 6:36 AM

## 2020-01-07 NOTE — Progress Notes (Signed)
Results of lumbar MRI show no acute process such as epidural hematoma or abscess to explain lower extremity weakness. I discussed the results with the patient and her family and that we can now be certain this is not anything emergent related to her epidural that would require intervention. Discussed the results with the obstetric team and that next step would likely be Neurology evaluation vs. continued PT and observation.  Carol Kern, MD  01/07/20 2:26 PM

## 2020-01-07 NOTE — Progress Notes (Signed)
Patient reminded several times throughout the night to call nurse for assistance to bathroom. Did not call. Patient found sitting on couch and then back in bed without calling for assistance. Patient said husband helped her despite being told that nurse needed to see her walk.

## 2020-01-07 NOTE — Progress Notes (Signed)
Toileting offered. Patient walked to bathroom and back with assistance.

## 2020-01-07 NOTE — Progress Notes (Signed)
Called by nurse and physical therapist who state that patient is now having bladder incontinence as well as severe lower extremity weakness (bilateral 2/5 strength) with foot drop. No back pain, sensation intact. Given the apparent progression of symptoms, will order STAT MRI lumbar spine to evaluate for epidural hematoma.  Lucretia Kern  01/07/20 10:48 AM

## 2020-01-07 NOTE — Anesthesia Post-op Follow-up Note (Addendum)
Anesthesia Follow-up Note  Patient: Carol Rangel  Day #: 2  Date of Follow-up: 01/06/20 Time: 20:15  Last Vitals:  Vitals:   01/06/20 2124 01/07/20 0525  BP: 119/74 101/61  Pulse: 78 61  Resp: 16 16  Temp: 37.2 C 37.1 C  SpO2: 99%    Called to evaluate bilateral lower extremity weakness. Pt is a G1P1 who underwent uncomplicated epidural placement on 01/04/20 for SVD. She reports bilateral lower extremity weakness that started after delivery. Describes generalized weakness, not specific to a certain muscle group. She reports requiring assistance ambulating, but that the weakness has slowly improved since delivery. Of note, delivery was complicated by PPH and 2nd degree laceration with extensive repair requiring the patient to be in stirrups. Denies any decreased LE sensation. No bowel/bladder incontinence. No saddle anesthesia. No back pain. No pain/erythema/purulence of epidural site. Pt was febrile yesterday and is currently receiving antibiotics; however, no chills, N/V, vision changes or changes in mental status.   On exam, pt is alert and oriented. BLE sensation intact. 4/5 strength throughout LE bilaterally. No tenderness to palpation of epidural site. No erythema, swelling, or purulence of epidural site.   A/P: This bilateral LE weakness is less likely a result of epidural placement as there is no back pain, changes in sensation, or bowel/bladder incontinence. The weakness is generalized and not specific to a certain muscle group. In addition, there has been no worsening of symptoms. Epidural placement was uncomplicated and epidural site is unremarkable. Suspect weakness is related to positioning during labor and delivery. Encourage ambulation and consider PT. I discussed concerning signs and symptoms with patient, and she voiced understanding and is amenable to plan.    Nevan Creighton L Ekaterina Denise

## 2020-01-07 NOTE — Lactation Note (Signed)
This note was copied from a baby's chart. Lactation Consultation Note  Patient Name: Carol Rangel IDPOE'U Date: 01/07/2020 Reason for consult: Follow-up assessment;1st time breastfeeding;Primapara;Term 68 62hrs old, wt loss 4.45%. Mom resting in bed, baby asleep in bassinet, female asleep on couch. Burmese interpreter Carol Rangel ID# [phone] tele services used. Mom reports baby last latched to breast at 130am for and took of formula. Mom denies having milk in breasts at this time and desires to continue supplementing with formula. Mom fed baby again at 6am with formula only, denies latching d/t breasts not having milk. Mom reports pumping 3 times yesterday. Mom reports long term plans of breast and formula feeding. Discussed milk transition from colostrum to transitional and mature milk and benefits of colostrum for baby, encouraged to latch baby to breast when feeding cues are shown, advised pump is for stimulation at this time. Advised wake baby if >3-4hrs since last feeding, expect 8-12 feedings per day. Discussed expected length of feedings, cluster feeding, engorgement and how to manage, [facility] BF brochure with numbers for LC support. Mom has a hand pump in room, aware how to use, denies DEBP at home. Mom declined LC offer to assist with next feeding, reports comfort with latching baby, states will call if needed.  Mom with no further concerns. BGilliam, RN, IBCLC  Plan - cue based feedings, wake if >3-4hrs since last feeding - latch baby to breast prior to formula use - monitor wet/stool diapers for adequate intake - call LC for questions/ concerns    Interventions Interventions: Breast feeding basics reviewed;Hand pump;DEBP    Consult Status Consult Status: Complete    Carol Rangel 01/07/2020, 9:12 AM

## 2020-01-07 NOTE — Care Management Note (Signed)
Case Management Note  Patient Details  Name: Carol Rangel MRN: [id number] Date of Birth: 04-24-1985  Subjective/Objective:                  bilateral leg paresthesia               Expected Discharge Date:  01/07/20                Discharge planning Services  CM Consult    DME Arranged:  3-N-1, Walker rolling with 5 inch wheels  DME Agency:  [facility]   Additional Comments: CM received call from RN and Physical Therapist that patient is needing 3n1 and rolling walker and physical therapy at discharge.  CM called referral to [facility] with [facility] # [phone] with referral for 3n1 and rolling walker with 5 inch wheels.  Order placed in epic and she accepted referral and equipment will be delivered to patient's room prior to discharge from hospital.   CM attempted to get Home Health Physical Therapy for patient but unable to get any agency to accept.  Dr aware and outpatient therapy for physical therapy referral made to the [facility] at [facility] [address] [place], [place] [address]  Phone # [phone].   Went over information with patient and verified demographics with patient and she verbalized understanding.  Gretchen Short RNC-MNN, BSN Transitions of Care Pediatrics[facility]   Emilio Math [place], [place] 01/07/2020, 11:33 AM

## 2020-01-07 NOTE — Consult Note (Addendum)
Neurology Consultation  Reason for Consult: Bilateral leg weakness Referring Physician: Truett Mainland, DO  CC: Bilateral leg weakness  History is obtained from: Patient and chart  HPI: Carol Rangel is a 34 y.o. female with history of sciatica on the right side, nausea vomiting.  Patient had a vaginal delivery on 10/19.  This was complicated with significant blood loss.  Over the past 3 days patient has been stating that she feels very weak in her lower extremities.  Her delivery and ensuing bleeding did not require prolonged placement in stirrups. Due to her weakness neurology was consulted.  On consultation patient states that although initially she could not walk now she has improved to the point that she can walk with a walker.  She does feel that she is extremely weak in the hips however has full sensation and has never had any tingling or numbness.  She focuses on the fact that she has sciatica to which she sees Dr. Jannifer Franklin as an outpatient neurologist at [facility].  She does admit that she is now able to use her walker to get to the bathroom however, there are multiple times when she does asked other individuals for assistance for fear that she may fall.  At this point she does not complain of headache, blurred vision, chest pain, GI pain however she does state that she has some discomfort over her right hip but this has been an ongoing issue with her sciatica again to which she sees Dr. Jannifer Franklin.   Denies chest pain nausea vomiting.  Denies bowel bladder incontinence.   Past Medical History:  Diagnosis Date  . Medical history non-contributory   . Nausea with vomiting 12/20/2013  . Sciatica of right side 09/24/2019     Family History  Problem Relation Age of Onset  . Cancer Maternal Grandmother   . Hypertension Mother    Social History:   reports that she has never smoked. She has never used smokeless tobacco. She reports that she does not drink alcohol and does  not use drugs.  Medications  Current Facility-Administered Medications:  .  acetaminophen (TYLENOL) tablet 1,000 mg, 1,000 mg, Oral, Q8H, Janet Berlin, MD, 1,000 mg at 01/07/20 1418 .  benzocaine-Menthol (DERMOPLAST) 20-0.5 % topical spray 1 application, 1 application, Topical, PRN, Berniece Andreas, Placido Sou, MD, 1 application at 69/48/54 0011 .  clindamycin (CLEOCIN) IVPB 900 mg, 900 mg, Intravenous, Q8H, Leftwich-Kirby, Lisa A, CNM, Last Rate: 100 mL/hr at 01/07/20 1008, 900 mg at 01/07/20 1008 .  coconut oil, 1 application, Topical, PRN, Marsala, Placido Sou, MD .  witch hazel-glycerin (TUCKS) pad 1 application, 1 application, Topical, PRN **AND** dibucaine (NUPERCAINAL) 1 % rectal ointment 1 application, 1 application, Rectal, PRN, Berniece Andreas, Placido Sou, MD .  diphenhydrAMINE (BENADRYL) capsule 25 mg, 25 mg, Oral, Q6H PRN, Janet Berlin, MD .  docusate sodium (COLACE) capsule 100 mg, 100 mg, Oral, BID, Janet Berlin, MD, 100 mg at 01/07/20 1005 .  gentamicin (GARAMYCIN) 320 mg in dextrose 5 % 100 mL IVPB, 5 mg/kg (Adjusted), Intravenous, Q24H, Truett Mainland, DO, Last Rate: 108 mL/hr at 01/06/20 1748, 320 mg at 01/06/20 1748 .  ibuprofen (ADVIL) tablet 600 mg, 600 mg, Oral, Q6H, Janet Berlin, MD, 600 mg at 01/07/20 1233 .  influenza vac split quadrivalent PF (FLUARIX) injection 0.5 mL, 0.5 mL, Intramuscular, Tomorrow-1000, Stinson, Jacob J, DO .  measles, mumps & rubella vaccine (MMR) injection 0.5 mL, 0.5 mL, Subcutaneous, Once, Marsala, Placido Sou, MD .  ondansetron (ZOFRAN) tablet 4 mg, 4 mg, Oral, Q4H PRN **OR** ondansetron (ZOFRAN) injection 4 mg, 4 mg, Intravenous, Q4H PRN, Janet Berlin, MD .  oxyCODONE (Oxy IR/ROXICODONE) immediate release tablet 5 mg, 5 mg, Oral, Q6H PRN, Si Raider, Ailene Rud, MD, 5 mg at 01/05/20 1034 .  polyethylene glycol (MIRALAX / GLYCOLAX) packet 17 g, 17 g, Oral, Daily PRN, Janet Berlin, MD .  prenatal multivitamin tablet 1 tablet, 1 tablet, Oral, Q1200,  Berniece Andreas Placido Sou, MD, 1 tablet at 01/07/20 1233 .  senna-docusate (Senokot-S) tablet 2 tablet, 2 tablet, Oral, Q24H, Marsala, Placido Sou, MD, 2 tablet at 01/06/20 2338 .  simethicone (MYLICON) chewable tablet 80 mg, 80 mg, Oral, PRN, Janet Berlin, MD .  Tdap (BOOSTRIX) injection 0.5 mL, 0.5 mL, Intramuscular, Once, Berniece Andreas, Placido Sou, MD  ROS:    General ROS: negative for - chills, fatigue, fever, night sweats, weight gain or weight loss Psychological ROS: negative for - behavioral disorder, hallucinations, memory difficulties, mood swings or suicidal ideation Ophthalmic ROS: negative for - blurry vision, double vision, eye pain or loss of vision ENT ROS: negative for - epistaxis, nasal discharge, oral lesions, sore throat, tinnitus or vertigo Allergy and Immunology ROS: negative for - hives or itchy/watery eyes Hematological and Lymphatic ROS: negative for - bleeding problems, bruising or swollen lymph nodes Endocrine ROS: negative for - galactorrhea, hair pattern changes, polydipsia/polyuria or temperature intolerance Respiratory ROS: negative for - cough, hemoptysis, shortness of breath or wheezing Cardiovascular ROS: negative for - chest pain, dyspnea on exertion, edema or irregular heartbeat Gastrointestinal ROS: negative for - abdominal pain, diarrhea, hematemesis, nausea/vomiting or stool incontinence Genito-Urinary ROS: negative for - dysuria, hematuria, incontinence or urinary frequency/urgency Musculoskeletal ROS: Positive for - muscular weakness Neurological ROS: as noted in HPI Dermatological ROS: negative for rash and skin lesion changes  Exam: Current vital signs: BP 101/61 (BP Location: Right Arm)   Pulse 61   Temp 98.7 F (37.1 C) (Oral)   Resp 16   Ht '5\' 5"'  (1.651 m)   Wt 73 kg   LMP 04/05/2019 (Within Days)   SpO2 99%   Breastfeeding Unknown   BMI 26.78 kg/m  Vital signs in last 24 hours: Temp:  [98.7 F (37.1 C)-98.9 F (37.2 C)] 98.7 F (37.1 C) (10/22  0525) Pulse Rate:  [61-78] 61 (10/22 0525) Resp:  [16] 16 (10/22 0525) BP: (101-119)/(61-74) 101/61 (10/22 0525) SpO2:  [99 %] 99 % (10/21 2124)   Constitutional: Appears well-developed and well-nourished.  Eyes: No scleral injection HENT: No OP obstrucion Head: Normocephalic.  Cardiovascular: Normal rate and regular rhythm.  Respiratory: Effort normal, non-labored breathing GI: Soft.  No distension. There is no tenderness.  Skin: WDI  Neuro: Mental Status: Patient is awake, alert, oriented to person, place, month, year, and situation. Speech-shows no aphasia or dysarthria.  Naming, repeating, comprehension are intact.  Patient is able to follow simple commands to the best of her ability. Cranial Nerves: II: Visual Fields are full.  III,IV, VI: EOMI without ptosis or diploplia. Pupils equal, round and reactive to light V: Facial sensation is symmetric to temperature VII: Facial movement is symmetric.  VIII: hearing is intact to voice X: Palat elevates symmetrically XI: Shoulder shrug is symmetric. XII: tongue is midline without atrophy or fasciculations.  Motor: Bilateral upper extremity shows 5/5 strength.  Bilateral lower extremity shows 4+/5 strength however there is some decreased effort and giveaway weakness.  While lifting the right leg up the bed, she did not  begin the left heel and vice versa.  When given encouragement she does give good strength. Sensory: Sensation is symmetric to light touch and temperature in the arms and legs. Deep Tendon Reflexes: 2+ and symmetric in the biceps and patellae and Achilles Plantars: Toes are downgoing bilaterally.  Cerebellar: FNF intact with no dysmetria  Labs I have reviewed labs in epic and the results pertinent to this consultation are:   CBC    Component Value Date/Time   WBC 12.0 (H) 01/05/2020 1946   RBC 2.79 (L) 01/05/2020 1946   HGB 9.0 (L) 01/05/2020 1946   HGB 12.1 11/24/2019 1535   HCT 27.4 (L) 01/05/2020 1946    HCT 37.0 11/24/2019 1535   PLT 118 (L) 01/05/2020 1946   PLT 138 (L) 11/24/2019 1535   MCV 98.2 01/05/2020 1946   MCV 95 11/24/2019 1535   MCH 32.3 01/05/2020 1946   MCHC 32.8 01/05/2020 1946   RDW 14.4 01/05/2020 1946   RDW 13.1 11/24/2019 1535   LYMPHSABS 1.6 01/05/2020 1946   LYMPHSABS 1.5 08/24/2019 0952   MONOABS 0.9 01/05/2020 1946   EOSABS 0.2 01/05/2020 1946   EOSABS 0.2 08/24/2019 0952   BASOSABS 0.0 01/05/2020 1946   BASOSABS 0.0 08/24/2019 0952    CMP     Component Value Date/Time   NA 134 (L) 01/05/2020 0145   K 3.9 01/05/2020 0145   CL 105 01/05/2020 0145   CO2 21 (L) 01/05/2020 0145   GLUCOSE 97 01/05/2020 0145   BUN <5 (L) 01/05/2020 0145   CREATININE 0.68 01/05/2020 0145   CALCIUM 8.8 (L) 01/05/2020 0145   PROT 4.8 (L) 01/05/2020 0145   ALBUMIN 2.1 (L) 01/05/2020 0145   AST 21 01/05/2020 0145   ALT 9 01/05/2020 0145   ALKPHOS 101 01/05/2020 0145   BILITOT 0.6 01/05/2020 0145   GFRNONAA >60 01/05/2020 0145   GFRAA >90 12/20/2013 0530    Imaging I have reviewed the images obtained:  MRI examination of the lumbar spine-no epidural hematoma, mild degenerative changes without significant canal or foraminal stenosis.  Etta Quill PA-C [facility] [phone]  M-F  (9:00 am- 5:00 PM)  01/07/2020, 4:17 PM   Assessment: This is a 34 year old female to which gave birth to her first child 3 days ago.  There was some complication of excessive bleeding which prolonged the delivery procedure.  Post birth, patient noted to have significant decrease strength in bilateral lower extremities.  At this point time she states it is improving to the point now she can walk with a walker.  There is no sensory issues associated with her weakness.  As noted above, exam does show good strength when patient concentrates however there is some embellishment with giveaway weakness and lack of effort.  That said given the fact that she was in stirrups for prolonged  period time there is a good possibility she could have some compressive neuropathies.  I had a detailed conversation with her and explained that this should improve in the next few days and complete resolution should happen within the next few weeks.  If that does not happen, she will need further work-up with EMG nerve conduction studies at that time.  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  Recommendations: -Continued encouragement for safe walking -Continue physical therapy -As for her sciatica she will need to follow-up with Dr. Jannifer Franklin her primary neurologist -If weakness does not improve she will likely need to  follow-up with Dr. Jannifer Franklin in approximately 4 to 6 weeks for a further nerve conduction/ EMG  Inpatient neurology will be available as needed.  Please call with questions.  Plan was discussed over the phone with the OB fellow, Dr. Berniece Andreas  Attending Neurohospitalist Addendum Patient seen and examined with APP/Resident. Agree with the history and physical as documented above. Agree with the plan as documented, which I helped formulate. I have independently reviewed the chart, obtained history, review of systems and examined the patient.I have personally reviewed pertinent head/neck/spine imaging (CT/MRI). Please feel free to call with any questions. --- Amie Portland, MD [facility] Pager: [phone]  If 7pm to 7am, please call on call as listed on AMION.

## 2020-01-07 NOTE — Progress Notes (Signed)
CSW received call due to MOB having questions regarding Medicaid and WIC. CSW spoke with MOB at bedside to give information on Medicaid as CSW was made aware that NP had already spoken with MOB regarding WIC. CSW advised MOB that CSW spoke with Artist and was advised that since MOB already had Medicaid that an application would already be in process for infant. CSW advised MOB that per Financial Counselor there is currently nothing for MOB to do and that [facility] DSS would send MOB a ID card in the mail for infants Medicaid. MOB reported that she understood and asked no other question and reported no other needs.     Claude Manges Latesa Fratto, MSW, LCSW [facility] [phone]

## 2020-01-07 NOTE — Addendum Note (Signed)
Addendum  created 01/07/20 6269 by Elmer Picker, MD   Clinical Note Signed

## 2020-01-07 NOTE — Discharge Instructions (Signed)
Please make a follow up appointment with your neurologist in 4-6 weeks.   Postpartum Care After Vaginal Delivery This sheet gives you information about how to care for yourself from the time you deliver your baby to up to 6-12 weeks after delivery (postpartum period). Your health care provider may also give you more specific instructions. If you have problems or questions, contact your health care provider. Follow these instructions at home: Vaginal bleeding  It is normal to have vaginal bleeding (lochia) after delivery. Wear a sanitary pad for vaginal bleeding and discharge. ? During the first week after delivery, the amount and appearance of lochia is often similar to a menstrual period. ? Over the next few weeks, it will gradually decrease to a dry, yellow-brown discharge. ? For most women, lochia stops completely by 4-6 weeks after delivery. Vaginal bleeding can vary from woman to woman.  Change your sanitary pads frequently. Watch for any changes in your flow, such as: ? A sudden increase in volume. ? A change in color. ? Large blood clots.  If you pass a blood clot from your vagina, save it and call your health care provider to discuss. Do not flush blood clots down the toilet before talking with your health care provider.  Do not use tampons or douches until your health care provider says this is safe.  If you are not breastfeeding, your period should return 6-8 weeks after delivery. If you are feeding your child breast milk only (exclusive breastfeeding), your period may not return until you stop breastfeeding. Perineal care  Keep the area between the vagina and the anus (perineum) clean and dry as told by your health care provider. Use medicated pads and pain-relieving sprays and creams as directed.  If you had a cut in the perineum (episiotomy) or a tear in the vagina, check the area for signs of infection until you are healed. Check for: ? More redness, swelling, or  pain. ? Fluid or blood coming from the cut or tear. ? Warmth. ? Pus or a bad smell.  You may be given a squirt bottle to use instead of wiping to clean the perineum area after you go to the bathroom. As you start healing, you may use the squirt bottle before wiping yourself. Make sure to wipe gently.  To relieve pain caused by an episiotomy, a tear in the vagina, or swollen veins in the anus (hemorrhoids), try taking a warm sitz bath 2-3 times a day. A sitz bath is a warm water bath that is taken while you are sitting down. The water should only come up to your hips and should cover your buttocks. Breast care  Within the first few days after delivery, your breasts may feel heavy, full, and uncomfortable (breast engorgement). Milk may also leak from your breasts. Your health care provider can suggest ways to help relieve the discomfort. Breast engorgement should go away within a few days.  If you are breastfeeding: ? Wear a bra that supports your breasts and fits you well. ? Keep your nipples clean and dry. Apply creams and ointments as told by your health care provider. ? You may need to use breast pads to absorb milk that leaks from your breasts. ? You may have uterine contractions every time you breastfeed for up to several weeks after delivery. Uterine contractions help your uterus return to its normal size. ? If you have any problems with breastfeeding, work with your health care provider or Advertising copywriter.  If you  are not breastfeeding: ? Avoid touching your breasts a lot. Doing this can make your breasts produce more milk. ? Wear a good-fitting bra and use cold packs to help with swelling. ? Do not squeeze out (express) milk. This causes you to make more milk. Intimacy and sexuality  Ask your health care provider when you can engage in sexual activity. This may depend on: ? Your risk of infection. ? How fast you are healing. ? Your comfort and desire to engage in sexual  activity.  You are able to get pregnant after delivery, even if you have not had your period. If desired, talk with your health care provider about methods of birth control (contraception). Medicines  Take over-the-counter and prescription medicines only as told by your health care provider.  If you were prescribed an antibiotic medicine, take it as told by your health care provider. Do not stop taking the antibiotic even if you start to feel better. Activity  Gradually return to your normal activities as told by your health care provider. Ask your health care provider what activities are safe for you.  Rest as much as possible. Try to rest or take a nap while your baby is sleeping. Eating and drinking   Drink enough fluid to keep your urine pale yellow.  Eat high-fiber foods every day. These may help prevent or relieve constipation. High-fiber foods include: ? Whole grain cereals and breads. ? Brown rice. ? Beans. ? Fresh fruits and vegetables.  Do not try to lose weight quickly by cutting back on calories.  Take your prenatal vitamins until your postpartum checkup or until your health care provider tells you it is okay to stop. Lifestyle  Do not use any products that contain nicotine or tobacco, such as cigarettes and e-cigarettes. If you need help quitting, ask your health care provider.  Do not drink alcohol, especially if you are breastfeeding. General instructions  Keep all follow-up visits for you and your baby as told by your health care provider. Most women visit their health care provider for a postpartum checkup within the first 3-6 weeks after delivery. Contact a health care provider if:  You feel unable to cope with the changes that your child brings to your life, and these feelings do not go away.  You feel unusually sad or worried.  Your breasts become red, painful, or hard.  You have a fever.  You have trouble holding urine or keeping urine from  leaking.  You have little or no interest in activities you used to enjoy.  You have not breastfed at all and you have not had a menstrual period for 12 weeks after delivery.  You have stopped breastfeeding and you have not had a menstrual period for 12 weeks after you stopped breastfeeding.  You have questions about caring for yourself or your baby.  You pass a blood clot from your vagina. Get help right away if:  You have chest pain.  You have difficulty breathing.  You have sudden, severe leg pain.  You have severe pain or cramping in your lower abdomen.  You bleed from your vagina so much that you fill more than one sanitary pad in one hour. Bleeding should not be heavier than your heaviest period.  You develop a severe headache.  You faint.  You have blurred vision or spots in your vision.  You have bad-smelling vaginal discharge.  You have thoughts about hurting yourself or your baby. If you ever feel like you may  hurt yourself or others, or have thoughts about taking your own life, get help right away. You can go to the nearest emergency department or call:  Your local emergency services (911 in the [place]  A suicide crisis helpline, such as the National Suicide Prevention Lifeline at 304-084-7055. This is open 24 hours a day. Summary  The period of time right after you deliver your newborn up to 6-12 weeks after delivery is called the postpartum period.  Gradually return to your normal activities as told by your health care provider.  Keep all follow-up visits for you and your baby as told by your health care provider. This information is not intended to replace advice given to you by your health care provider. Make sure you discuss any questions you have with your health care provider. Document Revised: 03/07/2017 Document Reviewed: 12/16/2016 Elsevier Patient Education  2020 [employer].

## 2020-01-08 NOTE — Lactation Note (Signed)
This note was copied from a baby's chart. Lactation Consultation Note  Patient Name: Carol Rangel Date: 01/08/2020 Reason for consult: Follow-up assessment;Primapara;1st time breastfeeding;Term Baby 91hrs old, wt loss 4.03%, mom sitting in bed, baby asleep in bed with mom, dad sitting on couch. Mom reports difficulty feeding baby on right breast, states baby will latch but will not suck for long time. Mom states last feeding was 1hr ago, baby nursed for then used formula to complete feeding. Mom states plans to continue both breast and formula feedings upon discharge.  Reinforced expected length of feedings and use of pump for stimulation and volume for feedings with EBM. Mom declined LC's offer to assist with latch, states she will continue to work at home. Reinforced [facility] BF brochure and numbers for [facility] telephone and outpatient support. Advised for each feeding nurse baby first, then offer formula, feed baby per cue and wake if >3hrs since last feeding. Mom to call for [facility] assistance if desires before discharge. Erenest Rasher, RN, IBCLC    Consult Status Consult Status: Complete Date: 01/08/20    Carol Rangel 01/08/2020, 1:16 PM

## 2020-01-08 NOTE — Discharge Summary (Addendum)
Postpartum Discharge Summary      Patient Name: Carol Rangel DOB: 03-May-1985 MRN: [id number]  Date of admission: 01/04/2020 Delivery date:01/04/2020  Delivering provider: Janet Berlin  Date of discharge: 01/08/2020  Admitting diagnosis: Normal labor and delivery [O80] Intrauterine pregnancy: [redacted]w[redacted]d    Secondary diagnosis:  Active Problems:   Gestational diabetes mellitus, class A1   Gestational thrombocytopenia ([facility]   Normal labor and delivery   Postpartum hemorrhage   Vaginal delivery   Acute endometritis  Additional problems: endometritis; bilateral leg paresthesia     Discharge diagnosis: Term Pregnancy Delivered and GDM A1                                              Post partum procedures: IV fereheme X1 , antibiotics  Augmentation: N/A Complications: [id number] Hospital course: Onset of Labor With Vaginal Delivery      34y.o. yo G1P0 at 350w1das admitted in Latent Labor on 01/04/2020. Patient had an uncomplicated labor course as follows:  Membrane Rupture Time/Date: 4:42 PM ,01/04/2020   Delivery Method:Vaginal, Spontaneous  Episiotomy: None  Lacerations:  2nd degree  Patient's course notable for postpartum hemorrhage after delivery. She received rectal cytotec, 1g TXA, bleeding remained normal after treatment.  She is ambulating with assistance at times(legs felt "funny" with decreased sensation for 2 days, now c/t "feeling weak in legs"; had some c/o urinary incontinence, but seemed to be more of dribbling urine when her bladder was full. Ambulating improved overnight; anesthesia saw pt this am and signed off on unlikely an anesthesia related issue, most likely d/t pushing position/prolonged perineal repair requiring stirrups. In house PT consult prior to DC this am. Pt is tolerating a regular diet, passing flatus, and urinating well. Patient is discharged home in stable condition on 01/08/20. Discharged home with walker and bedside commode. Plan for  outpatient PT follow-up.  Newborn Data: Birth date:01/04/2020  Birth time:6:15 PM  Gender:Female  Living status:Living  Apgars:9 ,9  Weight:7 lb 14.8 oz (3.595 kg)   Magnesium Sulfate received: No BMZ received: No Rhophylac:N/A MMR:N/A T-DaP:Given prenatally Flu: No Transfusion:No  Physical exam  Vitals:   01/07/20 0525 01/07/20 1706 01/07/20 2124 01/08/20 0556  BP: 101/61 109/66 108/75 107/67  Pulse: 61 88 95 71  Resp: _0 Temp: 98.7 F (37.1 C) 97.8 F (36.6 C) 98.6 F (37 C) 98.6 F (37 C)  TempSrc: Oral Oral Oral Oral  SpO2:  98% 98% 98%  Weight:      Height:       General: alert, cooperative and no distress Lochia: appropriate Uterine Fundus: firm Incision: N/A DVT Evaluation: No evidence of DVT seen on physical exam. No cords or calf tenderness. Normal sensation, generalized LE weakness w/4/5 strength  Labs: Lab Results  Component Value Date   WBC 12.0 (H) 01/05/2020   HGB 9.0 (L) 01/05/2020   HCT 27.4 (L) 01/05/2020   MCV 98.2 01/05/2020   PLT 118 (L) 01/05/2020   CMP Latest Ref Rng & Units 01/05/2020  Glucose 70 - 99 mg/dL 97  BUN 6 - 20 mg/dL <5(L)  Creatinine 0.44 - 1.00 mg/dL 0.68  Sodium 135 - 145 mmol/L 134(L)  Potassium 3.5 - 5.1 mmol/L 3.9  Chloride 98 - 111 mmol/L 105  CO2 22 - 32 mmol/L 21(L)  Calcium 8.9 - 10.3 mg/dL  8.8(L)  Total Protein 6.5 - 8.1 g/dL 4.8(L)  Total Bilirubin 0.3 - 1.2 mg/dL 0.6  Alkaline Phos 38 - 126 U/L 101  AST 15 - 41 U/L 21  ALT 0 - 44 U/L 9   Edinburgh Score: Edinburgh Postnatal Depression Scale Screening Tool 01/05/2020  I have been able to laugh and see the funny side of things. 1  I have looked forward with enjoyment to things. 0  I have blamed myself unnecessarily when things went wrong. 2  I have been anxious or worried for no good reason. 2  I have felt scared or panicky for no good reason. 0  Things have been getting on top of me. 0  I have been so unhappy that I have had difficulty  sleeping. 2  I have felt sad or miserable. 1  I have been so unhappy that I have been crying. 0  The thought of harming myself has occurred to me. 0  Edinburgh Postnatal Depression Scale Total 8     After visit meds:  Allergies as of 01/08/2020   No Known Allergies      Medication List     STOP taking these medications    Accu-Chek Guide test strip Generic drug: glucose blood   Accu-Chek Guide w/Device Kit   Accu-Chek Softclix Lancets lancets   Comfort Fit Maternity Supp Sm Misc       TAKE these medications    acetaminophen 500 MG tablet Commonly known as: TYLENOL Take 2 tablets (1,000 mg total) by mouth every 8 (eight) hours.   ferrous sulfate 325 (65 FE) MG tablet Take 1 tablet (325 mg total) by mouth every other day.   gabapentin 100 MG capsule Commonly known as: Neurontin One capsule three times a day for 1 week, then take 2 capsules three times a day   ibuprofen 600 MG tablet Commonly known as: ADVIL Take 1 tablet (600 mg total) by mouth every 6 (six) hours.   multivitamin-prenatal 27-0.8 MG Tabs tablet Take 1 tablet by mouth daily at 12 noon.               Durable Medical Equipment  (From admission, onward)           Start     Ordered   01/07/20 1115  For home use only DME 3 n 1  Once        01/07/20 1118   01/07/20 1115  For home use only DME Walker rolling  Once       Question Answer Comment  Walker: With 5 Inch Wheels   Patient needs a walker to treat with the following condition Bilateral leg paresthesia      01/07/20 1118             Discharge home in stable condition Infant Feeding: Breast Infant Disposition:home with mother Discharge instruction: per After Visit Summary and Postpartum booklet. Activity: Advance as tolerated. Pelvic rest for 6 weeks.  Diet: routine diet   Evaluate pt's bladder/IU at postpartum visit and refer to PT if indicated  Future Appointments: Future Appointments  Date Time Provider  [place]  02/16/2020  8:10 AM Tresea Mall, CNM [facility] None  04/06/2020  9:00 AM Kathrynn Ducking, MD [facility] None   Follow up Visit:  Follow-up Information     [facility] Follow up on 02/16/2020.   Specialty: Obstetrics and Gynecology Why: for postpartum checkup, call office for time Contact information: [place]  [place] [address] [phone]        [facility] Follow up.   Specialty: Rehabilitation Why: Call for an follow up Physical Therapy appointment at this location. We were unable to find a home health therapy service for you, so this is our next best option.  Contact information: [address] [place], [phone] [place] [facility]. Go to.   Specialty: Obstetrics and Gynecology Why: Go to the Maternal Assessment Unit if you develop any of the following: (1) temperature over 100.4*F (2) worsening pain, either vaginal or abdominal  (3) worsening bleeding or passing large clots Contact information: [address] [phone] [place] [phone]                 Please schedule this patient for a In person postpartum visit in 6 weeks with the following provider: Any provider. Additional Postpartum F/U:2 hour GTT at 6 weeks Low risk pregnancy complicated by: GDM Delivery mode:  Vaginal, Spontaneous  Anticipated Birth Control:  Unsure   01/08/2020 Randa Ngo, MD

## 2020-01-08 NOTE — Progress Notes (Signed)
Physical Therapy Treatment Patient Details Name: Carol Rangel MRN: [id number] DOB: 07-05-1985 Today's Date: 01/08/2020    History of Present Illness Pt is 34 yo female s/p labor and delivery on 01/04/20.  She did have postpartum hemorrhage and received rectal cytotec.  Pt has had c/o weak legs and urinary incontinence.  Pt did receive epidural and aneshesiology has reviewed the pt in regard to current complaints and felt related to postioning during labor as weakness is generalized, sensation intact, and no c/o back pain. PT was asked to evaluate pt.    PT Comments    Pt with improved mobility this session. Min assist supine to sit, min/min guard assist sit to stand, min guard assist ambulation 50' with RW, and min guard assist sit to supine. Pt reporting R hip pain radiating just below knee on lateral aspect. This is in line with her h/o sciatica on R. Pt continues to present with full sensation BLE. 2+/5 to 3-/5 gross strength BLE.    Follow Up Recommendations  Home health PT;Supervision/Assistance - 24 hour (HHPT if possible - if not recommend outpt PT with women's health speciality (one option at [facility] location))     Equipment Recommendations  Rolling walker with 5" wheels;3in1 (PT)    Recommendations for Other Services       Precautions / Restrictions Precautions Precautions: Fall    Mobility  Bed Mobility Overal bed mobility: Needs Assistance Bed Mobility: Supine to Sit;Sit to Supine     Supine to sit: Min assist;HOB elevated Sit to supine: Min guard   General bed mobility comments: assist to elevate trunk getting OOB, able to independently get BLE back into bed, increased time and effort  Transfers Overall transfer level: Needs assistance Equipment used: Rolling walker (2 wheeled) Transfers: Sit to/from Stand Sit to Stand: Min guard;Min assist         General transfer comment: cues for hand placement. Initially requiring min assist progressing to min  guard on second trial.  Ambulation/Gait Ambulation/Gait assistance: Min guard Gait Distance (Feet): 50 Feet Assistive device: Rolling walker (2 wheeled) Gait Pattern/deviations: Step-through pattern;Decreased stride length Gait velocity: decreased Gait velocity interpretation: <1.8 ft/sec, indicate of risk for recurrent falls General Gait Details: Pt with improved WB thru LE and less lean/pressure on RW. Pt able to clear feet bilat during swing phase. Decreased stride length. Increased time.   Stairs             Wheelchair Mobility    Modified Rankin (Stroke Patients Only)       Balance Overall balance assessment: Needs assistance Sitting-balance support: No upper extremity supported;Feet supported Sitting balance-Leahy Scale: Good     Standing balance support: No upper extremity supported;Bilateral upper extremity supported;During functional activity Standing balance-Leahy Scale: Fair Standing balance comment: static stand at sink without UE support min guard assist. Reliant on RW for amb.                            Cognition Arousal/Alertness: Awake/alert Behavior During Therapy: WFL for tasks assessed/performed Overall Cognitive Status: Within Functional Limits for tasks assessed                                        Exercises      General Comments General comments (skin integrity, edema, etc.): MRI (-)      Pertinent Vitals/Pain Pain  Assessment: Faces Faces Pain Scale: Hurts little more Pain Location: R hip/sciatica Pain Descriptors / Indicators: Discomfort;Shooting;Guarding;Grimacing Pain Intervention(s): Monitored during session;Repositioned    Home Living                      Prior Function            PT Goals (current goals can now be found in the care plan section) Acute Rehab PT Goals Patient Stated Goal: get stronger and be able to walk and take care of baby Progress towards PT goals: Progressing  toward goals    Frequency    Min 3X/week      PT Plan Current plan remains appropriate    Co-evaluation              AM-PAC PT "6 Clicks" Mobility   Outcome Measure  Help needed turning from your back to your side while in a flat bed without using bedrails?: None Help needed moving from lying on your back to sitting on the side of a flat bed without using bedrails?: A Little Help needed moving to and from a bed to a chair (including a wheelchair)?: A Little Help needed standing up from a chair using your arms (e.g., wheelchair or bedside chair)?: A Little Help needed to walk in hospital room?: A Little Help needed climbing 3-5 steps with a railing? : A Lot 6 Click Score: 18    End of Session Equipment Utilized During Treatment: Gait belt Activity Tolerance: Patient tolerated treatment well Patient left: in bed;with call bell/phone within reach;with family/visitor present Nurse Communication: Mobility status PT Visit Diagnosis: Muscle weakness (generalized) (M62.81);Other abnormalities of gait and mobility (R26.89)     Time: 7253-6644 PT Time Calculation (min) (ACUTE ONLY): 24 min  Charges:  $Gait Training: 23-37 mins                     Aida Raider, [place]  Office # [phone] Pager [phone]    Ilda Foil 01/08/2020, 8:51 AM

## 2020-01-12 ENCOUNTER — Encounter (HOSPITAL_COMMUNITY): Payer: Self-pay | Admitting: Obstetrics & Gynecology

## 2020-01-12 ENCOUNTER — Other Ambulatory Visit: Payer: Self-pay

## 2020-01-12 ENCOUNTER — Inpatient Hospital Stay (HOSPITAL_COMMUNITY)
Admission: AD | Admit: 2020-01-12 | Discharge: 2020-01-12 | Disposition: A | Discharge status: Home / Self Care | Payer: Medicaid Other | Attending: Obstetrics & Gynecology | Admitting: Obstetrics & Gynecology

## 2020-01-12 DIAGNOSIS — O901 Disruption of perineal obstetric wound: Secondary | ICD-10-CM

## 2020-01-12 DIAGNOSIS — O9089 Other complications of the puerperium, not elsewhere classified: Secondary | ICD-10-CM | POA: Insufficient documentation

## 2020-01-12 DIAGNOSIS — T8133XA Disruption of traumatic injury wound repair, initial encounter: Secondary | ICD-10-CM

## 2020-01-12 MED ORDER — LIDOCAINE HCL (PF) 1 % IJ SOLN
INTRAMUSCULAR | Status: AC
  Administered 2020-01-12: 5 mL via INTRADERMAL
  Filled 2020-01-12: qty 5

## 2020-01-12 MED ORDER — OXYCODONE HCL 5 MG PO TABS
5.0000 mg | ORAL_TABLET | ORAL | 0 refills | Status: DC | PRN
Start: 2020-01-12 — End: 2020-08-30

## 2020-01-12 MED ORDER — LIDOCAINE HCL URETHRAL/MUCOSAL 2 % EX GEL
1.0000 "application " | Freq: Once | CUTANEOUS | Status: AC
  Administered 2020-01-12: 1 via TOPICAL

## 2020-01-12 MED ORDER — LIDOCAINE HCL (PF) 1 % IJ SOLN: 5.0000 mL | Freq: Once | INTRAMUSCULAR | Status: AC

## 2020-01-12 MED ORDER — AMOXICILLIN-POT CLAVULANATE 875-125 MG PO TABS: 1.0000 | ORAL_TABLET | Freq: Two times a day (BID) | ORAL | 0 refills | Status: AC

## 2020-01-12 NOTE — MAU Note (Signed)
Vaginal delivery on 01/04/20.Stated she feeling  something is wrong with the stiches from the repair. C/O buring to area as well.

## 2020-01-12 NOTE — MAU Note (Signed)
Dr. Lynetta Mare at bedside to rerepair vaginal tear from vag delivery on 01/04/20. suture came loose. Pt tolerate procedure well. Ice pack to peri area.

## 2020-01-12 NOTE — Discharge Instructions (Signed)
Care of a Perineal Tear A perineal tear is a cut or tear (laceration) in the tissue between the opening of the vagina and the anus (perineum). Some women develop a perineal tear during a vaginal birth. This can happen as the baby emerges from the birth canal and the perineum is stretched. There are four degrees of perineal tears based on how deep and long the laceration is:  First degree. This involves a shallow tear at the edge of the vaginal opening that extends slightly into the perineal skin.  Second degree. This involves tearing described in first degree perineal tear, and an additional deeper tear of the vaginal opening and perineal tissues. It may also include tearing of a muscle just under the perineal skin.  Third degree. This involves tearing described in first and second degree perineal tears, with the addition that tearing in the third degree extends into the muscle of the anus (anal sphincter).  Fourth degree. This involves all levels of tears described in first, second, and third degree perineal tears, with the tear in the fourth degree extending into the rectum. First and second degree perineal tears may or may not be stitched closed, depending on their location and appearance. Third and fourth degree perineal tears are stitched closed immediately after the baby's birth. What are the risks? Depending on the type of perineal tear you have, you may be at risk for:  Bleeding.  Developing a collection of blood in the perineal tear area (hematoma).  Pain. This may include pain when you urinate, or pain when you have a bowel movement.  Infection at the site of the tear.  Fever.  Trouble controlling your urination or bowels (incontinence).  Painful sex. How to care for a perineal tear Wound care  Take a sitz bath as told by your health care provider. A sitz bath is a warm water bath that is taken while you are sitting down. The water should only come up to your hips and should  cover your buttocks. This can speed up healing. 1. Partially fill a bathtub with warm water. You will only need the water to be deep enough to cover your hips and buttocks when you are sitting in it. 2. If your health care provider told you to put medicine in the water, follow the directions exactly as told. 3. Sit in the water and open the tub drain a little. 4. Turn on the warm water again to keep the tub at the correct level. Keep the water running constantly. 5. Soak in the water for 15-20 minutes or as told by your health care provider. 6. After the sitz bath, pat the affected area dry first. Do not rub it. 7. Be careful when you stand up after the sitz bath because you may feel dizzy.  Wash your hands before and after applying medicine to the area.  Wear a sanitary pad as told by your health care provider. Change the pad as often as told by your health care provider.  Leave stitches (sutures), skin glue, or adhesive strips in place. These skin closures may need to stay in place for 2 weeks or longer. If adhesive strip edges start to loosen and curl up, you may trim the loose edges. Do not remove adhesive strips completely unless your health care provider tells you to do that.  Check your wound every day for signs of infection. Check for: ? Redness, swelling, or pain. ? Fluid or blood. ? Warmth. ? Pus or a bad  smell. Managing pain  If directed, put ice on the painful area: ? Put ice in a plastic bag. ? Place a towel between your skin and the bag. ? Leave the ice on for 20 minutes, 2-3 times a day.  Apply a numbing spray to the perineal tear site as told by your health care provider. This may help with discomfort.  Take and apply over-the-counter and prescription medicines only as told by your health care provider.  If told, put about 3 witch hazel-containing hemorrhoid treatment pads on top of your sanitary pad. The witch hazel in the hemorrhoid pads helps with swelling and  discomfort.  Sit on an inflatable ring or pillow. This may provide comfort. General instructions  Squeeze warm water on your perineum after urinating. This should be done from front to back with a squeeze bottle. Pat the area to dry it.  Do not have sex, use tampons, or place anything in your vagina for at least 6 weeks or as told by your health care provider.  Keep all follow-up visits as told by your health care provider. These include any postpartum visits. This is important. Contact a health care provider if:  Your pain is not relieved with medicines.  You have painful urination.  You have redness, swelling, or pain around your tear.  You have fluid or blood coming from your tear.  Your tear feels warm to the touch.  You have pus or a bad smell coming from your tear.  You have a fever. Get help right away if:  Your tear opens.  You cannot urinate.  You have an increase in bleeding.  You have severe pain. Summary  A perineal tear is a cut or tear (laceration) in the tissue between the opening of the vagina and the anus (perineum).  There are four degrees of perineal tears based on how deep and long the laceration is.  First and second-degree perineal tears may or may not be stitched closed, depending on their location and appearance. Third and fourth- degree perineal tears are stitched closed immediately after the baby's birth.  Follow your health care provider's instructions for caring for your perineal tear. Know how to manage pain and how to care for your wound. Know when to call your health care provider and when to seek immediate emergency care. This information is not intended to replace advice given to you by your health care provider. Make sure you discuss any questions you have with your health care provider. Document Revised: 02/14/2017 Document Reviewed: 04/08/2016 Elsevier Patient Education  2020 [employer].

## 2020-01-12 NOTE — MAU Provider Note (Signed)
    [facility] OB/GYN Attending MAU Note  Subjective:  Patient here today with FOB, reporting having an opening and loose stitches in her vagina. She had a vaginal delivery on 01/04/20, complicated by complex second degree laceration that took over an hour to repair.  Patient reports burning in the area as well.  Denies any abnormal vaginal discharge, fevers, chills, sweats, dysuria, nausea, vomiting, other GI or GU symptoms or other general symptoms.     Objective:  Blood pressure 105/76, pulse 97, temperature 98.5 F (36.9 C), resp. rate 18, unknown if currently breastfeeding. Gen: NAD HENT: Normocephalic, atraumatic Lungs: Normal respiratory effort Heart: Regular rate noted Abdomen: NT gravid fundus, soft Ext: 2+ DTRs, no edema, no cyanosis, negative Homan's sign Pelvic: 7 mm superficial dehiscence noted in the posterior fourchette. Some bleeding noted. Very tender to touch, mild erythema noted.  Dehiscence repair Patient verbally consented for repair of superficial separation. Betadine swab used to cleanse area.  Lidocaine 2% gel applied to area, then 1%lidocaine injected.  3-0 Vicryl interrupted stitches used to re-approximate separation.  Patient tolerated procedure well.    Assessment & Plan:  34 y.o. G1P1001 POD#8 with superficial separation of repaired second degree laceration, now s/p repair of the separation. - Will continue Ibuprofen, Tylenol for analgesia. 15 Oxycodone tablets also prescribed as needed for severe pain. - Augmentin prescribed for infection prophylaxis - Recommended ice baths, sitz baths to help - Will follow up in office next week for evaluation, message sent to [facility] to schedule appointment Patient told to call office or come back to MAU for any worsening symptoms or other concerns.     Jaynie Collins, MD, FACOG Obstetrician & Gynecologist, [facility] for [employer], [facility]

## 2020-01-13 ENCOUNTER — Telehealth: Payer: Self-pay | Admitting: General Practice

## 2020-01-13 NOTE — Telephone Encounter (Signed)
-----   Message from Tereso Newcomer, MD sent at 01/12/2020  7:02 PM EDT ----- Regarding: Needs appointment next week Had complicated second degree laceration, opened a little and I fixed it in MAU on 01/12/20.  Needs to be seen next week, any provider.   Thank you!  [facility]

## 2020-01-13 NOTE — Telephone Encounter (Signed)
Patient notified of follow up appt on 01/19/2020 at 1:30pm and voiced understanding.

## 2020-01-19 ENCOUNTER — Ambulatory Visit: Payer: Medicaid Other | Admitting: Obstetrics and Gynecology

## 2020-02-16 ENCOUNTER — Other Ambulatory Visit: Payer: Self-pay

## 2020-02-16 ENCOUNTER — Encounter: Payer: Self-pay | Admitting: Advanced Practice Midwife

## 2020-02-16 ENCOUNTER — Ambulatory Visit (INDEPENDENT_AMBULATORY_CARE_PROVIDER_SITE_OTHER): Payer: Medicaid Other | Admitting: Advanced Practice Midwife

## 2020-02-16 DIAGNOSIS — O24410 Gestational diabetes mellitus in pregnancy, diet controlled: Secondary | ICD-10-CM

## 2020-02-16 NOTE — Progress Notes (Signed)
Post Partum Visit Note  Carol Rangel is a 34 y.o. [id number] female who presents for a postpartum visit. She is 6 weeks postpartum following a normal spontaneous vaginal delivery.  I have fully reviewed the prenatal and intrapartum course. The delivery was at 39.1 gestational weeks.  Anesthesia: epidural. Postpartum course has been uncomplicated. Baby is doing well. Baby is feeding by bottle - [employer] Good Start. Bleeding no bleeding. Bowel function is normal. Bladder function is normal. Patient is not sexually active. Contraception method is none. Postpartum depression screening: negative.   The pregnancy intention screening data noted above was reviewed. Potential methods of contraception were discussed. The patient elected to proceed with No Method - No Contraceptive Precautions.    Edinburgh Postnatal Depression Scale - 02/16/20 0814      Edinburgh Postnatal Depression Scale:  In the Past 7 Days   I have been able to laugh and see the funny side of things. 0    I have looked forward with enjoyment to things. 0    I have blamed myself unnecessarily when things went wrong. 0    I have been anxious or worried for no good reason. 0    I have felt scared or panicky for no good reason. 0    Things have been getting on top of me. 0    I have been so unhappy that I have had difficulty sleeping. 2    I have felt sad or miserable. 0    I have been so unhappy that I have been crying. 0    The thought of harming myself has occurred to me. 0    Edinburgh Postnatal Depression Scale Total 2            The following portions of the patient's history were reviewed and updated as appropriate: allergies, current medications, past family history, past medical history, past social history, past surgical history and problem list.  Review of Systems Pertinent items are noted in HPI.    Objective:  BP 117/81 (BP Location: Left Arm, Patient Position: Sitting, Cuff Size: Normal)   Pulse 80   Temp  98.1 F (36.7 C) (Oral)   Ht 5\' 5"  (1.651 m)   Wt 142 lb 12.8 oz (64.8 kg)   Breastfeeding No   BMI 23.76 kg/m    Physical Exam Vitals and nursing note reviewed. Exam conducted with a chaperone present.  Constitutional:      Appearance: Normal appearance.  HENT:     Head: Normocephalic.     Mouth/Throat:     Mouth: Mucous membranes are moist.  Cardiovascular:     Rate and Rhythm: Normal rate.  Pulmonary:     Effort: Pulmonary effort is normal.  Abdominal:     Palpations: Abdomen is soft.  Genitourinary:    Comments: perineum well healed Tiny amount of suture still present  Skin:    General: Skin is warm and dry.  Neurological:     Mental Status: She is alert and oriented to person, place, and time.  Psychiatric:        Mood and Affect: Mood normal.        Behavior: Behavior normal.     Assessment:    Normal  postpartum exam. Pap smear not done at today's visit.   Plan:   Essential components of care per ACOG recommendations:  1.  Mood and well being: Patient with negative depression screening today. Reviewed local resources for support.  - Patient does not use  tobacco. NA If using tobacco we discussed reduction and for recently cessation risk of relapse - hx of drug use? No  NA If yes, discussed support systems  2. Infant care and feeding:  -Patient currently breastmilk feeding? No NA If breastmilk feeding discussed return to work and pumping. If needed, patient was provided letter for work to allow for every 2-3 hr pumping breaks, and to be granted a private location to express breastmilk and refrigerated area to store breastmilk. Reviewed importance of draining breast regularly to support lactation. -Social determinants of health (SDOH) reviewed in EPIC. No concerns  3. Sexuality, contraception and birth spacing - Patient does want a pregnancy in the next year.  Desired family size is 3 children.  - Reviewed forms of contraception in tiered fashion. Patient  desired no method today.   - Discussed birth spacing of 18 months  4. Sleep and fatigue -Encouraged family/partner/community support of 4 hrs of uninterrupted sleep to help with mood and fatigue  5. Physical Recovery  - Discussed patients delivery and complications - Patient had a 2nd degree laceration, perineal healing reviewed. Patient expressed understanding - Patient has urinary incontinence? Yes Patient offered pelvic floor PT. She is unsure about that at this time, but would like to think about it and call us if she wants to proceed.  - Patient is safe to resume physical and sexual activity  6.  Health Maintenance - Last pap smear done 08/2019 and was normal with negative HPV. NA, age Mammogram   Tonye Royalty, CNM  02/16/20  8:34 AM

## 2020-02-17 NOTE — Addendum Note (Signed)
Addended by: Clovis Pu on: 02/17/2020 09:24 AM   Modules accepted: Orders

## 2020-02-21 ENCOUNTER — Other Ambulatory Visit: Payer: Medicaid Other

## 2020-02-21 DIAGNOSIS — O24410 Gestational diabetes mellitus in pregnancy, diet controlled: Secondary | ICD-10-CM

## 2020-04-06 ENCOUNTER — Ambulatory Visit: Payer: Medicaid Other | Admitting: Neurology

## 2020-04-06 ENCOUNTER — Telehealth: Payer: Self-pay | Admitting: Neurology

## 2020-04-06 NOTE — Telephone Encounter (Signed)
This patient did not show for a revisit appointment today. 

## 2020-08-30 ENCOUNTER — Ambulatory Visit (INDEPENDENT_AMBULATORY_CARE_PROVIDER_SITE_OTHER): Payer: Medicaid Other | Admitting: General Practice

## 2020-08-30 ENCOUNTER — Other Ambulatory Visit: Payer: Self-pay

## 2020-08-30 DIAGNOSIS — Z3201 Encounter for pregnancy test, result positive: Secondary | ICD-10-CM | POA: Diagnosis not present

## 2020-08-30 LAB — POCT PREGNANCY, URINE: Preg Test, Ur: POSITIVE — AB

## 2020-08-30 NOTE — Progress Notes (Signed)
Patient came by office to drop off urine sample for UPT. UPT +.  Called patient at home and she reports first positive home test within the past 2 weeks. LMP 07/14/20 EDD 04/20/21 [redacted]w[redacted]d. She is already taking PNV. Patient went to [facility] last pregnancy but desires to come here this time. Told patient someone from our front office will reach out to her with an appt. Patient verbalized understanding.  Chase Caller RN BSN 08/30/20

## 2020-08-30 NOTE — Progress Notes (Signed)
Chart reviewed for nurse visit. Agree with plan of care.   Venora Maples, MD 08/30/20 4:41 PM

## 2020-10-11 ENCOUNTER — Ambulatory Visit (INDEPENDENT_AMBULATORY_CARE_PROVIDER_SITE_OTHER): Payer: Medicaid Other | Admitting: Obstetrics and Gynecology

## 2020-10-11 ENCOUNTER — Other Ambulatory Visit: Payer: Self-pay

## 2020-10-11 ENCOUNTER — Other Ambulatory Visit (HOSPITAL_COMMUNITY)
Admission: RE | Admit: 2020-10-11 | Discharge: 2020-10-11 | Disposition: A | Discharge status: Home / Self Care | Payer: Medicaid Other | Source: Ambulatory Visit | Attending: Obstetrics and Gynecology | Admitting: Obstetrics and Gynecology

## 2020-10-11 ENCOUNTER — Encounter: Payer: Self-pay | Admitting: Family Medicine

## 2020-10-11 ENCOUNTER — Encounter: Payer: Self-pay | Admitting: Obstetrics and Gynecology

## 2020-10-11 VITALS — BP 123/84 | HR 87 | Wt 147.0 lb

## 2020-10-11 DIAGNOSIS — R32 Unspecified urinary incontinence: Secondary | ICD-10-CM

## 2020-10-11 DIAGNOSIS — O09899 Supervision of other high risk pregnancies, unspecified trimester: Secondary | ICD-10-CM | POA: Insufficient documentation

## 2020-10-11 DIAGNOSIS — Z8632 Personal history of gestational diabetes: Secondary | ICD-10-CM

## 2020-10-11 DIAGNOSIS — O09299 Supervision of pregnancy with other poor reproductive or obstetric history, unspecified trimester: Secondary | ICD-10-CM | POA: Diagnosis not present

## 2020-10-11 DIAGNOSIS — Z3480 Encounter for supervision of other normal pregnancy, unspecified trimester: Secondary | ICD-10-CM | POA: Diagnosis not present

## 2020-10-11 DIAGNOSIS — O24419 Gestational diabetes mellitus in pregnancy, unspecified control: Secondary | ICD-10-CM | POA: Insufficient documentation

## 2020-10-11 DIAGNOSIS — O09529 Supervision of elderly multigravida, unspecified trimester: Secondary | ICD-10-CM | POA: Diagnosis not present

## 2020-10-11 MED ORDER — ASPIRIN EC 81 MG PO TBEC: 81.0000 mg | DELAYED_RELEASE_TABLET | Freq: Every day | ORAL | 2 refills | Status: DC

## 2020-10-11 MED ORDER — DOXYLAMINE-PYRIDOXINE 10-10 MG PO TBEC: 2.0000 | DELAYED_RELEASE_TABLET | Freq: Every day | ORAL | 5 refills | Status: DC

## 2020-10-11 MED ORDER — PROMETHAZINE HCL 25 MG PO TABS: 25.0000 mg | ORAL_TABLET | Freq: Four times a day (QID) | ORAL | 2 refills | Status: DC | PRN

## 2020-10-11 NOTE — Patient Instructions (Signed)
Obstetrics: Normal and Problem Pregnancies (7th ed., pp. 102-121). [place], [place]: Elsevier."> Textbook of Family Medicine (9th ed., pp. 365-410). [place], [place]: [employer].">  First Trimester of Pregnancy  The first trimester of pregnancy starts on the first day of your last menstrual period until the end of week 12. This is months 1 through 3 of pregnancy. A week after a sperm fertilizes an egg, the egg will implant into the wall of the uterus and begin to develop into a baby. By the end of 12 weeks, all the baby'sorgans will be formed and the baby will be 2-3 inches in size. Body changes during your first trimester Your body goes through many changes during pregnancy. The changes vary andgenerally return to normal after your baby is born. Physical changes You may gain or lose weight. Your breasts may begin to grow larger and become tender. The tissue that surrounds your nipples (areola) may become darker. Dark spots or blotches (chloasma or mask of pregnancy) may develop on your face. You may have changes in your hair. These can include thickening or thinning of your hair or changes in texture. Health changes You may feel nauseous, and you may vomit. You may have heartburn. You may develop headaches. You may develop constipation. Your gums may bleed and may be sensitive to brushing and flossing. Other changes You may tire easily. You may urinate more often. Your menstrual periods will stop. You may have a loss of appetite. You may develop cravings for certain kinds of food. You may have changes in your emotions from day to day. You may have more vivid and strange dreams. Follow these instructions at home: Medicines Follow your health care provider's instructions regarding medicine use. Specific medicines may be either safe or unsafe to take during pregnancy. Do not take any medicines unless told to by your health care provider. Take a prenatal vitamin that contains at least  600 micrograms (mcg) of folic acid. Eating and drinking Eat a healthy diet that includes fresh fruits and vegetables, whole grains, good sources of protein such as meat, eggs, or tofu, and low-fat dairy products. Avoid raw meat and unpasteurized juice, milk, and cheese. These carry germs that can harm you and your baby. If you feel nauseous or you vomit: Eat 4 or 5 small meals a day instead of 3 large meals. Try eating a few soda crackers. Drink liquids between meals instead of during meals. You may need to take these actions to prevent or treat constipation: Drink enough fluid to keep your urine pale yellow. Eat foods that are high in fiber, such as beans, whole grains, and fresh fruits and vegetables. Limit foods that are high in fat and processed sugars, such as fried or sweet foods. Activity Exercise only as directed by your health care provider. Most people can continue their usual exercise routine during pregnancy. Try to exercise for 30 minutes at least 5 days a week. Stop exercising if you develop pain or cramping in the lower abdomen or lower back. Avoid exercising if it is very hot or humid or if you are at high altitude. Avoid heavy lifting. If you choose to, you may have sex unless your health care provider tells you not to. Relieving pain and discomfort Wear a good support bra to relieve breast tenderness. Rest with your legs elevated if you have leg cramps or low back pain. If you develop bulging veins (varicose veins) in your legs: Wear support hose as told by your health care provider. Elevate   your feet for 15 minutes, 3-4 times a day. Limit salt in your diet. Safety Wear your seat belt at all times when driving or riding in a car. Talk with your health care provider if someone is verbally or physically abusive to you. Talk with your health care provider if you are feeling sad or have thoughts of hurting yourself. Lifestyle Do not use hot tubs, steam rooms, or  saunas. Do not douche. Do not use tampons or scented sanitary pads. Do not use herbal remedies, alcohol, illegal drugs, or medicines that are not approved by your health care provider. Chemicals in these products can harm your baby. Do not use any products that contain nicotine or tobacco, such as cigarettes, e-cigarettes, and chewing tobacco. If you need help quitting, ask your health care provider. Avoid cat litter boxes and soil used by cats. These carry germs that can cause birth defects in the baby and possibly loss of the unborn baby (fetus) by miscarriage or stillbirth. General instructions During routine prenatal visits in the first trimester, your health care provider will do a physical exam, perform necessary tests, and ask you how things are going. Keep all follow-up visits. This is important. Ask for help if you have counseling or nutritional needs during pregnancy. Your health care provider can offer advice or refer you to specialists for help with various needs. Schedule a dentist appointment. At home, brush your teeth with a soft toothbrush. Floss gently. Write down your questions. Take them to your prenatal visits. Where to find more information American Pregnancy Association: americanpregnancy.org [employer]: https://www.todd-brady.net/ Office on [employer] Health: [employer] Contact a health care provider if you have: Dizziness. A fever. Mild pelvic cramps, pelvic pressure, or nagging pain in the abdominal area. Nausea, vomiting, or diarrhea that lasts for 24 hours or longer. A bad-smelling vaginal discharge. Pain when you urinate. Known exposure to a contagious illness, such as chickenpox, measles, Zika virus, HIV, or hepatitis. Get help right away if you have: Spotting or bleeding from your vagina. Severe abdominal cramping or pain. Shortness of breath or chest pain. Any kind of trauma, such as from a fall  or a car crash. New or increased pain, swelling, or redness in an arm or leg. Summary The first trimester of pregnancy starts on the first day of your last menstrual period until the end of week 12 (months 1 through 3). Eating 4 or 5 small meals a day rather than 3 large meals may help to relieve nausea and vomiting. Do not use any products that contain nicotine or tobacco, such as cigarettes, e-cigarettes, and chewing tobacco. If you need help quitting, ask your health care provider. Keep all follow-up visits. This is important. This information is not intended to replace advice given to you by your health care provider. Make sure you discuss any questions you have with your healthcare provider. Document Revised: 08/11/2019 Document Reviewed: 06/17/2019 Elsevier Patient Education  2022 [employer].  Second Trimester of Pregnancy  The second trimester of pregnancy is from week 13 through week 27. This is months 4 through 6 of pregnancy. The second trimester is often a time when you feel your best. Your body has adjusted to being pregnant, and you begin to feelbetter physically. During the second trimester: Morning sickness has lessened or stopped completely. You may have more energy. You may have an increase in appetite. The second trimester is also a time when the unborn baby (fetus) is growing rapidly. At the end  of the sixth month, the fetus may be up to 12 inches long and weigh about 1 pounds. You will likely begin to feel the baby move (quickening) between 16 and 20 weeks of pregnancy. Body changes during your second trimester Your body continues to go through many changes during your second trimester.The changes vary and generally return to normal after the baby is born. Physical changes Your weight will continue to increase. You will notice your lower abdomen bulging out. You may begin to get stretch marks on your hips, abdomen, and breasts. Your breasts will continue to grow and to  become tender. Dark spots or blotches (chloasma or mask of pregnancy) may develop on your face. A dark line from your belly button to the pubic area (linea nigra) may appear. You may have changes in your hair. These can include thickening of your hair, rapid growth, and changes in texture. Some people also have hair loss during or after pregnancy, or hair that feels dry or thin. Health changes You may develop headaches. You may have heartburn. You may develop constipation. You may develop hemorrhoids or swollen, bulging veins (varicose veins). Your gums may bleed and may be sensitive to brushing and flossing. You may urinate more often because the fetus is pressing on your bladder. You may have back pain. This is caused by: Weight gain. Pregnancy hormones that are relaxing the joints in your pelvis. A shift in weight and the muscles that support your balance. Follow these instructions at home: Medicines Follow your health care provider's instructions regarding medicine use. Specific medicines may be either safe or unsafe to take during pregnancy. Do not take any medicines unless approved by your health care provider. Take a prenatal vitamin that contains at least 600 micrograms (mcg) of folic acid. Eating and drinking Eat a healthy diet that includes fresh fruits and vegetables, whole grains, good sources of protein such as meat, eggs, or tofu, and low-fat dairy products. Avoid raw meat and unpasteurized juice, milk, and cheese. These carry germs that can harm you and your baby. You may need to take these actions to prevent or treat constipation: Drink enough fluid to keep your urine pale yellow. Eat foods that are high in fiber, such as beans, whole grains, and fresh fruits and vegetables. Limit foods that are high in fat and processed sugars, such as fried or sweet foods. Activity Exercise only as directed by your health care provider. Most people can continue their usual exercise  routine during pregnancy. Try to exercise for 30 minutes at least 5 days a week. Stop exercising if you develop contractions in your uterus. Stop exercising if you develop pain or cramping in the lower abdomen or lower back. Avoid exercising if it is very hot or humid or if you are at a high altitude. Avoid heavy lifting. If you choose to, you may have sex unless your health care provider tells you not to. Relieving pain and discomfort Wear a supportive bra to prevent discomfort from breast tenderness. Take warm sitz baths to soothe any pain or discomfort caused by hemorrhoids. Use hemorrhoid cream if your health care provider approves. Rest with your legs raised (elevated) if you have leg cramps or low back pain. If you develop varicose veins: Wear support hose as told by your health care provider. Elevate your feet for 15 minutes, 3-4 times a day. Limit salt in your diet. Safety Wear your seat belt at all times when driving or riding in a car. Talk with your  health care provider if someone is verbally or physically abusive to you. Lifestyle Do not use hot tubs, steam rooms, or saunas. Do not douche. Do not use tampons or scented sanitary pads. Avoid cat litter boxes and soil used by cats. These carry germs that can cause birth defects in the baby and possibly loss of the fetus by miscarriage or stillbirth. Do not use herbal remedies, alcohol, illegal drugs, or medicines that are not approved by your health care provider. Chemicals in these products can harm your baby. Do not use any products that contain nicotine or tobacco, such as cigarettes, e-cigarettes, and chewing tobacco. If you need help quitting, ask your health care provider. General instructions During a routine prenatal visit, your health care provider will do a physical exam and other tests. He or she will also discuss your overall health. Keep all follow-up visits. This is important. Ask your health care provider for a  referral to a local prenatal education class. Ask for help if you have counseling or nutritional needs during pregnancy. Your health care provider can offer advice or refer you to specialists for help with various needs. Where to find more information American Pregnancy Association: americanpregnancy.org [employer]: https://www.todd-brady.net/ Office on [employer] Health: [employer] Contact a health care provider if you have: A headache that does not go away when you take medicine. Vision changes or you see spots in front of your eyes. Mild pelvic cramps, pelvic pressure, or nagging pain in the abdominal area. Persistent nausea, vomiting, or diarrhea. A bad-smelling vaginal discharge or foul-smelling urine. Pain when you urinate. Sudden or extreme swelling of your face, hands, ankles, feet, or legs. A fever. Get help right away if you: Have fluid leaking from your vagina. Have spotting or bleeding from your vagina. Have severe abdominal cramping or pain. Have difficulty breathing. Have chest pain. Have fainting spells. Have not felt your baby move for the time period told by your health care provider. Have new or increased pain, swelling, or redness in an arm or leg. Summary The second trimester of pregnancy is from week 13 through week 27 (months 4 through 6). Do not use herbal remedies, alcohol, illegal drugs, or medicines that are not approved by your health care provider. Chemicals in these products can harm your baby. Exercise only as directed by your health care provider. Most people can continue their usual exercise routine during pregnancy. Keep all follow-up visits. This is important. This information is not intended to replace advice given to you by your health care provider. Make sure you discuss any questions you have with your healthcare provider. Document Revised: 08/11/2019 Document Reviewed:  06/17/2019 Elsevier Patient Education  2022 [employer].  Contraception Choices Contraception, also called birth control, refers to methods or devices thatprevent pregnancy. Hormonal methods  Contraceptive implant A contraceptive implant is a thin, plastic tube that contains a hormone that prevents pregnancy. It is different from an intrauterine device (IUD). It is inserted into the upper part of the arm by a health care provider. Implants canbe effective for up to 3 years. Progestin-only injections Progestin-only injections are injections of progestin, a synthetic form of thehormone progesterone. They are given every 3 months by a health care provider. Birth control pills Birth control pills are pills that contain hormones that prevent pregnancy. They must be taken once a day, preferably at the same time each day. Aprescription is needed to use this method of contraception. Birth control patch The birth control patch contains  hormones that prevent pregnancy. It is placed on the skin and must be changed once a week for three weeks and removed on thefourth week. A prescription is needed to use this method of contraception. Vaginal ring A vaginal ring contains hormones that prevent pregnancy. It is placed in the vagina for three weeks and removed on the fourth week. After that, the process is repeated with a new ring. A prescription is needed to use this method ofcontraception. Emergency contraceptive Emergency contraceptives prevent pregnancy after unprotected sex. They come in pill form and can be taken up to 5 days after sex. They work best the sooner they are taken after having sex. Most emergency contraceptives are available without a prescription. This method should not be used as your only form ofbirth control. Barrier methods  Female condom A female condom is a thin sheath that is worn over the penis during sex. Condoms keep sperm from going inside a woman's body. They can be used with a  sperm-killing substance (spermicide) to increase their effectiveness. They should be thrown away after one use. Female condom A female condom is a soft, loose-fitting sheath that is put into the vagina before sex. The condom keeps sperm from going inside a woman's body. Theyshould be thrown away after one use. Diaphragm A diaphragm is a soft, dome-shaped barrier. It is inserted into the vagina before sex, along with a spermicide. The diaphragm blocks sperm from entering the uterus, and the spermicide kills sperm. A diaphragm should be left in thevagina for 6-8 hours after sex and removed within 24 hours. A diaphragm is prescribed and fitted by a health care provider. A diaphragm should be replaced every 1-2 years, after giving birth, after gaining more than15 lb (6.8 kg), and after pelvic surgery. Cervical cap A cervical cap is a round, soft latex or plastic cup that fits over the cervix. It is inserted into the vagina before sex, along with spermicide. It blocks sperm from entering the uterus. The cap should be left in place for 6-8 hours after sex and removed within 48 hours. A cervical cap must be prescribed andfitted by a health care provider. It should be replaced every 2 years. Sponge A sponge is a soft, circular piece of polyurethane foam with spermicide in it. The sponge helps block sperm from entering the uterus, and the spermicide kills sperm. To use it, you make it wet and then insert it into the vagina. It should be inserted before sex, left in for at least 6 hours after sex, and removed andthrown away within 30 hours. Spermicides Spermicides are chemicals that kill or block sperm from entering the cervix and uterus. They can come as a cream, jelly, suppository, foam, or tablet. A spermicide should be inserted into the vagina with an applicator at least 10-15 minutes before sex to allow time for it to work. The process must be repeatedevery time you have sex. Spermicides do not require a  prescription. Intrauterine contraception Intrauterine device (IUD) An IUD is a T-shaped device that is put in a woman's uterus. There are two types: Hormone IUD.This type contains progestin, a synthetic form of the hormone progesterone. This type can stay in place for 3-5 years. Copper IUD.This type is wrapped in copper wire. It can stay in place for 10 years. Permanent methods of contraception Female tubal ligation In this method, a woman's fallopian tubes are sealed, tied, or blocked duringsurgery to prevent eggs from traveling to the uterus. Hysteroscopic sterilization In this method, a small,  flexible insert is placed into each fallopian tube. The inserts cause scar tissue to form in the fallopian tubes and block them, so sperm cannot reach an egg. The procedure takes about 3 months to be effective.Another form of birth control must be used during those 3 months. Female sterilization This is a procedure to tie off the tubes that carry sperm (vasectomy). After the procedure, the man can still ejaculate fluid (semen). Another form of birth control must be used for 3 months after the procedure. Natural planning methods Natural family planning In this method, a couple does not have sex on days when the woman could become pregnant. Calendar method In this method, the woman keeps track of the length of each menstrual cycle, identifies the days when pregnancy can happen, and does not have sex on those days. Ovulation method In this method, a couple avoids sex during ovulation. Symptothermal method This method involves not having sex during ovulation. The woman typically checks for ovulation bywatching changes in her temperature and in the consistency of cervical mucus. Post-ovulation method In this method, a couple waits to have sex until after ovulation. Where to find more information Centers for Disease Control and Prevention: www.cdc.gov Summary Contraception, also called birth control,  refers to methods or devices that prevent pregnancy. Hormonal methods of contraception include implants, injections, pills, patches, vaginal rings, and emergency contraceptives. Barrier methods of contraception can include female condoms, female condoms, diaphragms, cervical caps, sponges, and spermicides. There are two types of IUDs (intrauterine devices). An IUD can be put in a woman's uterus to prevent pregnancy for 3-5 years. Permanent sterilization can be done through a procedure for males and females. Natural family planning methods involve nothaving sex on days when the woman could become pregnant. This information is not intended to replace advice given to you by your health care provider. Make sure you discuss any questions you have with your healthcare provider. Document Revised: 08/09/2019 Document Reviewed: 08/09/2019 Elsevier Patient Education  2022 Elsevier Inc.  

## 2020-10-11 NOTE — Progress Notes (Signed)
Subjective:    Carol Rangel is a G2P1001 [redacted]w[redacted]d being seen today for her first obstetrical visit.  Her obstetrical history is significant for advanced maternal age and history of GDMA1  and short interval between pregnancies (SVD 12/2019). Patient does intend to breast feed. Pregnancy history fully reviewed.  Patient reports nausea and urinary incontinence. Patient reports incontinence worse following delivery of her son in 12/2019. It improved slightly but now that she is pregnant she is having a hard time holding her urine.  Vitals:   10/11/20 0917  BP: 123/84  Pulse: 87  Weight: 147 lb (66.7 kg)    HISTORY: OB History  Gravida Para Term Preterm AB Living  2 1 1     1   SAB IAB Ectopic Multiple Live Births        0 1    # Outcome Date GA Lbr Len/2nd Weight Sex Delivery Anes PTL Lv  2 Current           1 Term 01/04/20 [redacted]w[redacted]d 10:41 / 01:34 7 lb 14.8 oz (3.595 kg) M Vag-Spont EPI  LIV   Past Medical History:  Diagnosis Date   Gestational diabetes    Medical history non-contributory    Nausea with vomiting 12/20/2013   Sciatica of right side 09/24/2019   Past Surgical History:  Procedure Laterality Date   BREAST SURGERY Right    ?abscess surgery x2   Family History  Problem Relation Age of Onset   Hypertension Mother    Hypertension Father    Cancer Maternal Grandmother      Exam    Uterus:     Pelvic Exam:    Perineum: Normal Perineum   Vulva: normal   Vagina:  normal mucosa, normal discharge   pH:    Cervix: multiparous appearance and cervix is closed and long   Adnexa: normal adnexa and no mass, fullness, tenderness   Bony Pelvis: gynecoid  System: Breast:  normal appearance, no masses or tenderness   Skin: normal coloration and turgor, no rashes    Neurologic: oriented, no focal deficits   Extremities: normal strength, tone, and muscle mass   HEENT extra ocular movement intact   Mouth/Teeth mucous membranes moist, pharynx normal without lesions and dental  hygiene good   Neck supple and no masses   Cardiovascular: regular rate and rhythm   Respiratory:  appears well, vitals normal, no respiratory distress, acyanotic, normal RR, chest clear, no wheezing, crepitations, rhonchi, normal symmetric air entry   Abdomen: soft, non-tender; bowel sounds normal; no masses,  no organomegaly   Urinary:       Assessment:    Pregnancy: G2P1001 Patient Active Problem List   Diagnosis Date Noted   Supervision of other high risk pregnancy, antepartum 10/11/2020   History of gestational diabetes in prior pregnancy, currently pregnant 10/11/2020   Short interval between pregnancies affecting pregnancy, antepartum 10/11/2020   AMA (advanced maternal age) multigravida 35+ 10/11/2020   Postpartum hemorrhage 01/04/2020   Language barrier affecting health care 10/28/2019   Sciatica of right side 09/24/2019   Supervision of normal first pregnancy, antepartum 08/24/2019   Malnutrition of moderate degree (HCC) 12/20/2013        Plan:     Initial labs drawn. Prenatal vitamins. Problem list reviewed and updated. Genetic Screening discussed : Panorama deferred- Patient declined at this time.  Ultrasound discussed; fetal survey: ordered. Rx ASA provided Rx diclegis and phenergan provided Patient referred to Physical therapy for urinary incontinence  Follow up in 4  weeks. 50% of 30 min visit spent on counseling and coordination of care.     Tiffanye Hartmann 10/11/2020

## 2020-10-12 LAB — CBC/D/PLT+RPR+RH+ABO+RUBIGG...
Antibody Screen: NEGATIVE
Basophils Absolute: 0 10*3/uL (ref 0.0–0.2)
Basos: 0 %
EOS (ABSOLUTE): 0.2 10*3/uL (ref 0.0–0.4)
Eos: 2 %
HCV Ab: <0.1 s/co ratio (ref 0.0–0.9)
HIV Screen 4th Generation wRfx: NONREACTIVE
Hematocrit: 38.8 % (ref 34.0–46.6)
Hemoglobin: 12.8 g/dL (ref 11.1–15.9)
Hepatitis B Surface Ag: NEGATIVE
Immature Grans (Abs): 0 10*3/uL (ref 0.0–0.1)
Immature Granulocytes: 0 %
Lymphocytes Absolute: 1.8 10*3/uL (ref 0.7–3.1)
Lymphs: 19 %
MCH: 30.8 pg (ref 26.6–33.0)
MCHC: 33 g/dL (ref 31.5–35.7)
MCV: 93 fL (ref 79–97)
Monocytes Absolute: 0.5 10*3/uL (ref 0.1–0.9)
Monocytes: 5 %
Neutrophils Absolute: 6.8 10*3/uL (ref 1.4–7.0)
Neutrophils: 74 %
Platelets: 200 10*3/uL (ref 150–450)
RBC: 4.16 x10E6/uL (ref 3.77–5.28)
RDW: 12.2 % (ref 11.7–15.4)
RPR Ser Ql: NONREACTIVE
Rh Factor: POSITIVE
Rubella Antibodies, IGG: 4.74 index (ref >0.99)
WBC: 9.4 10*3/uL (ref 3.4–10.8)

## 2020-10-12 LAB — GC/CHLAMYDIA PROBE AMP (~~LOC~~) NOT AT ARMC
Chlamydia: NEGATIVE
Comment: NEGATIVE
Comment: NORMAL
Neisseria Gonorrhea: NEGATIVE

## 2020-10-12 LAB — HCV INTERPRETATION

## 2020-10-12 LAB — HEMOGLOBIN A1C
Est. average glucose Bld gHb Est-mCnc: 94 mg/dL
Hgb A1c MFr Bld: 4.9 % (ref 4.8–5.6)

## 2020-10-13 LAB — URINE CULTURE, OB REFLEX

## 2020-10-13 LAB — CULTURE, OB URINE

## 2020-11-09 ENCOUNTER — Other Ambulatory Visit: Payer: Self-pay

## 2020-11-09 ENCOUNTER — Ambulatory Visit (INDEPENDENT_AMBULATORY_CARE_PROVIDER_SITE_OTHER): Payer: Medicaid Other | Admitting: Family Medicine

## 2020-11-09 VITALS — BP 112/71 | HR 86 | Wt 148.6 lb

## 2020-11-09 DIAGNOSIS — Z3490 Encounter for supervision of normal pregnancy, unspecified, unspecified trimester: Secondary | ICD-10-CM

## 2020-11-09 DIAGNOSIS — O09529 Supervision of elderly multigravida, unspecified trimester: Secondary | ICD-10-CM

## 2020-11-09 DIAGNOSIS — D649 Anemia, unspecified: Secondary | ICD-10-CM | POA: Insufficient documentation

## 2020-11-09 DIAGNOSIS — R32 Unspecified urinary incontinence: Secondary | ICD-10-CM

## 2020-11-09 NOTE — Progress Notes (Signed)
   Subjective:  Carol Rangel is a 35 y.o. G2P1001 at [redacted]w[redacted]d being seen today for ongoing prenatal care.  She is currently monitored for the following issues for this low-risk pregnancy and has Malnutrition of moderate degree (HCC); Sciatica of right side; Language barrier affecting health care; Postpartum hemorrhage; Supervision of other high risk pregnancy, antepartum; History of gestational diabetes in prior pregnancy, currently pregnant; Short interval between pregnancies affecting pregnancy, antepartum; AMA (advanced maternal age) multigravida 35+; and Anemia on their problem list.  Patient reports no complaints.  Contractions: Irritability. Vag. Bleeding: None.  Movement: Absent. Denies leaking of fluid.   The following portions of the patient's history were reviewed and updated as appropriate: allergies, current medications, past family history, past medical history, past social history, past surgical history and problem list. Problem list updated.  Objective:   Vitals:   11/09/20 1554  BP: 112/71  Pulse: 86  Weight: 148 lb 9.6 oz (67.4 kg)    Fetal Status: Fetal Heart Rate (bpm): 150   Movement: Absent     General:  Alert, oriented and cooperative. Patient is in no acute distress.  Skin: Skin is warm and dry. No rash noted.   Cardiovascular: Normal heart rate noted  Respiratory: Normal respiratory effort, no problems with respiration noted  Abdomen: Soft, gravid, appropriate for gestational age. Pain/Pressure: Absent     Pelvic: Vag. Bleeding: None     Cervical exam deferred        Extremities: Normal range of motion.  Edema: None  Mental Status: Normal mood and affect. Normal behavior. Normal judgment and thought content.    Assessment and Plan:  Pregnancy: G2P1001 at [redacted]w[redacted]d  1. Supervision of other low risk pregnancy, antepartum Labs from initial prenatal visit reviewed, no anemia in this pregnancy. Also discussed a1c normal. Will check GTT at 28 weeks.  Declines AFP. Has appt  scheduled for anatomy US already. Questions answered  2. Urinary incontinence, unspecified type History of urinary incontinence since last delivery (which was 10 months ago). Was referred to pelvic floor PT during intial prenatal visit but patient has not gone yet. Asking if they would have afternoon appts so she can go while her husband can watch 70 month old.  -Discussed pelvic floor PT can be very beneficial for symptoms and counseled to call the number back (they left a voicemail on her phone) to schedule and request afternoon appointment.  Preterm labor symptoms and general obstetric precautions including but not limited to vaginal bleeding, contractions, leaking of fluid and fetal movement were reviewed in detail with the patient. Please refer to After Visit Summary for other counseling recommendations.  Return in about 4 weeks (around 12/07/2020) for LROB .   Warner Mccreedy, MD, MPH OB Fellow, [facility]

## 2020-11-17 ENCOUNTER — Other Ambulatory Visit: Payer: Self-pay | Admitting: Internal Medicine

## 2020-11-17 DIAGNOSIS — N281 Cyst of kidney, acquired: Secondary | ICD-10-CM

## 2020-11-22 ENCOUNTER — Ambulatory Visit
Admission: RE | Admit: 2020-11-22 | Discharge: 2020-11-22 | Disposition: A | Discharge status: Home / Self Care | Payer: Medicaid Other | Source: Ambulatory Visit | Attending: Internal Medicine | Admitting: Internal Medicine

## 2020-11-22 DIAGNOSIS — N281 Cyst of kidney, acquired: Secondary | ICD-10-CM

## 2020-11-22 IMAGING — US US RENAL
1 series · 13 of 25 positions shown · non-contrast
Comparison: [DATE]

CLINICAL DATA: Renal cyst, left

EXAM:
RENAL / URINARY TRACT ULTRASOUND COMPLETE

[Series 1: us renal · 0.23mm/px · 13 of 45 slices shown]
[im 1/45]
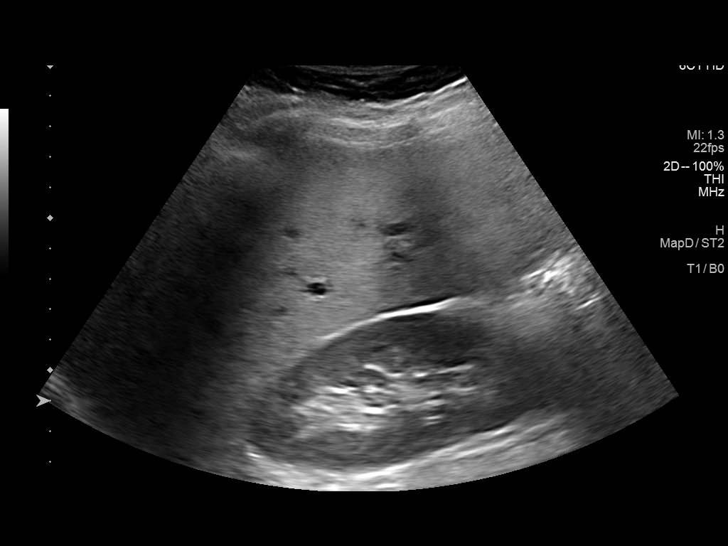
[im 4/45]
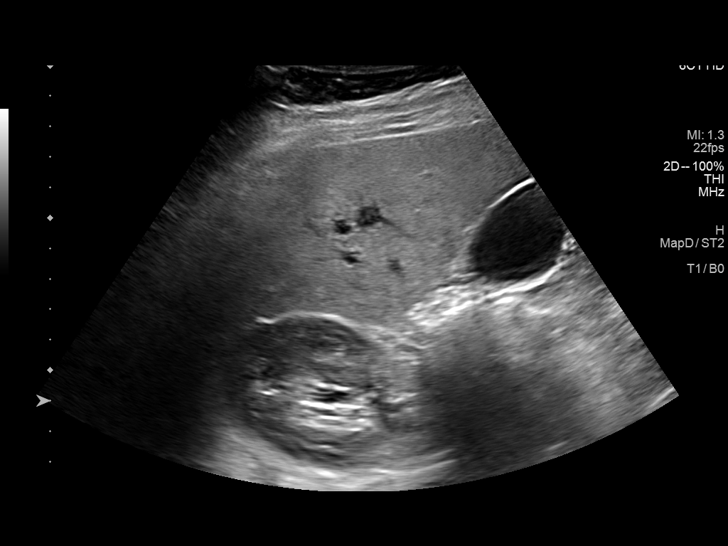
[im 8/45]
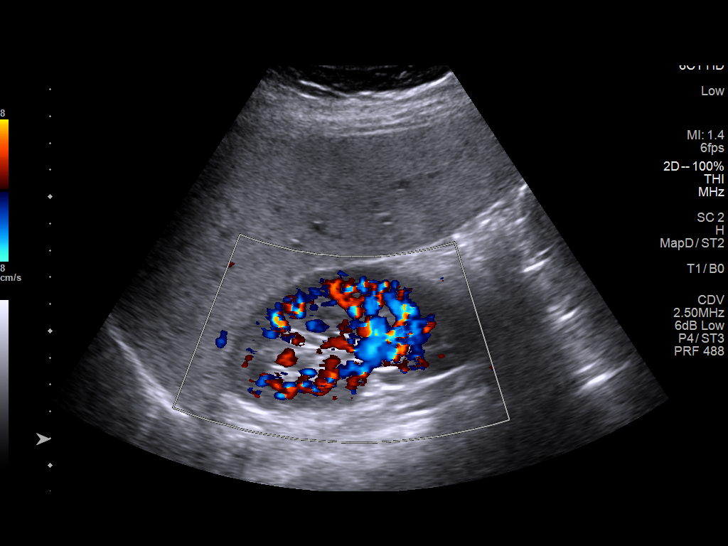
[im 12/45]
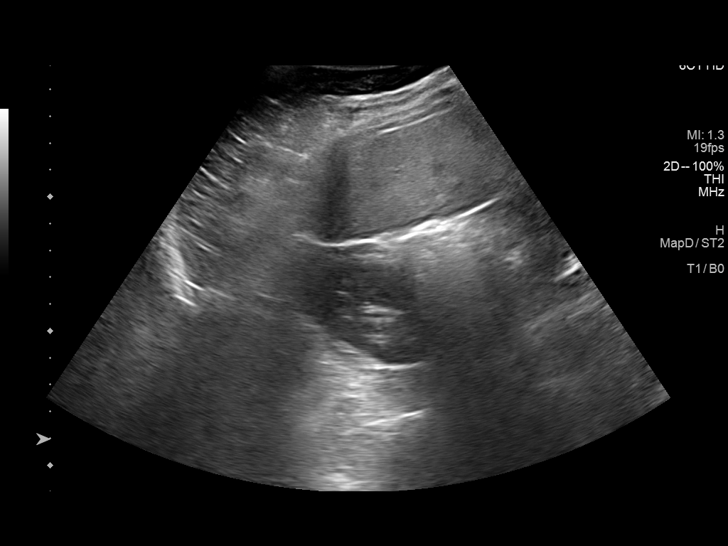
[im 15/45]
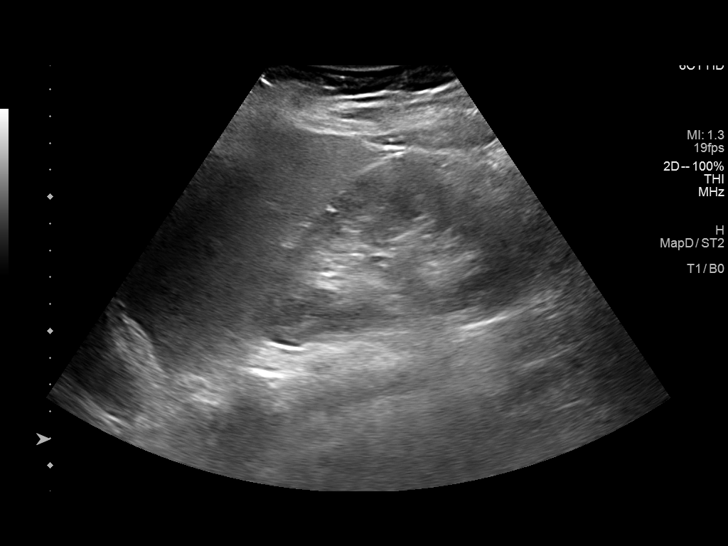
[im 19/45]
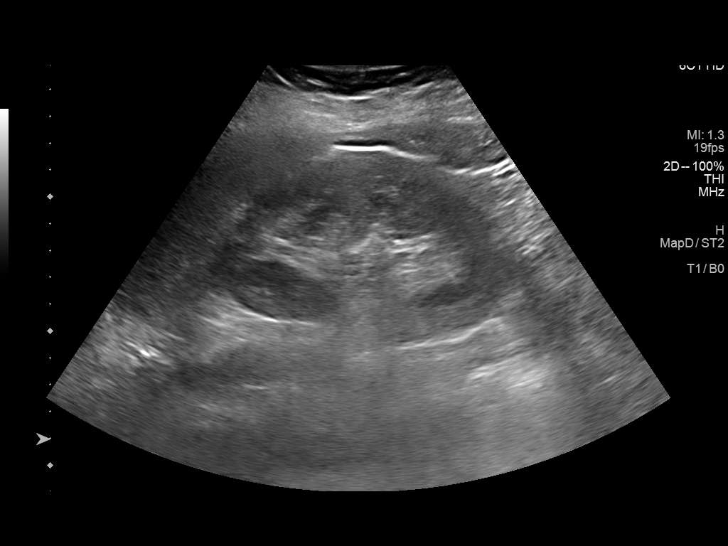
[im 23/45]
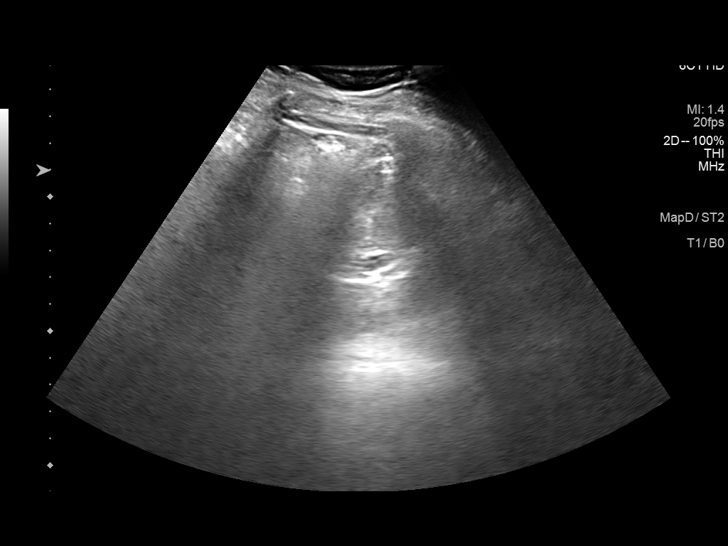
[im 26/45]
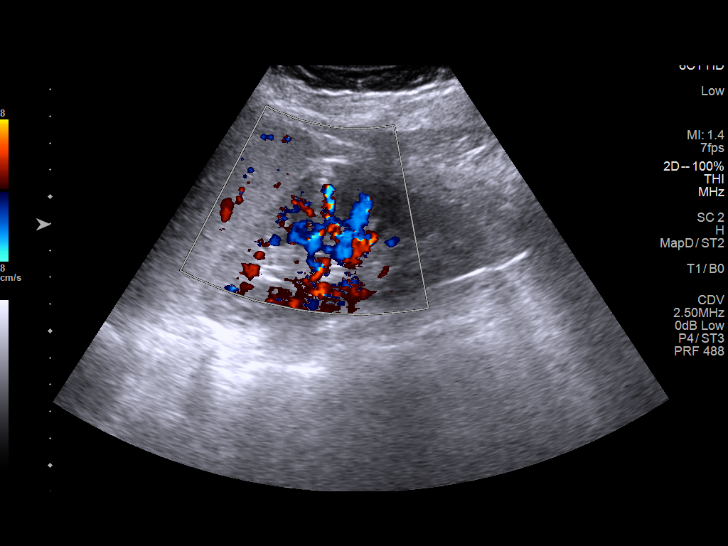
[im 30/45]
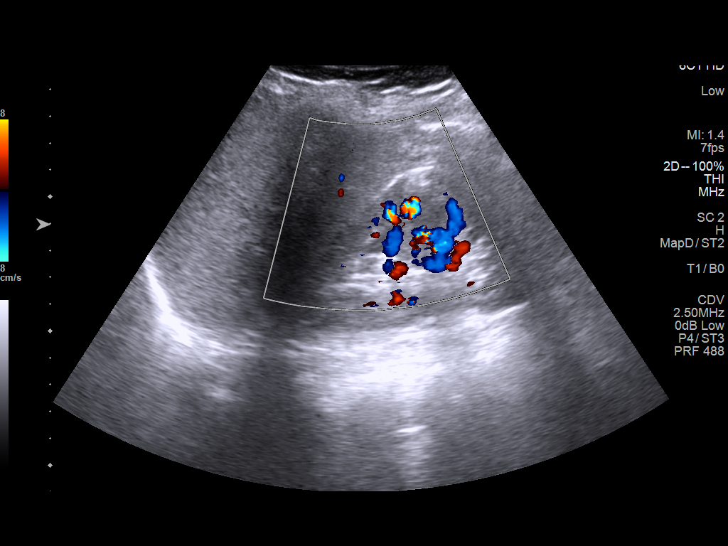
[im 34/45]
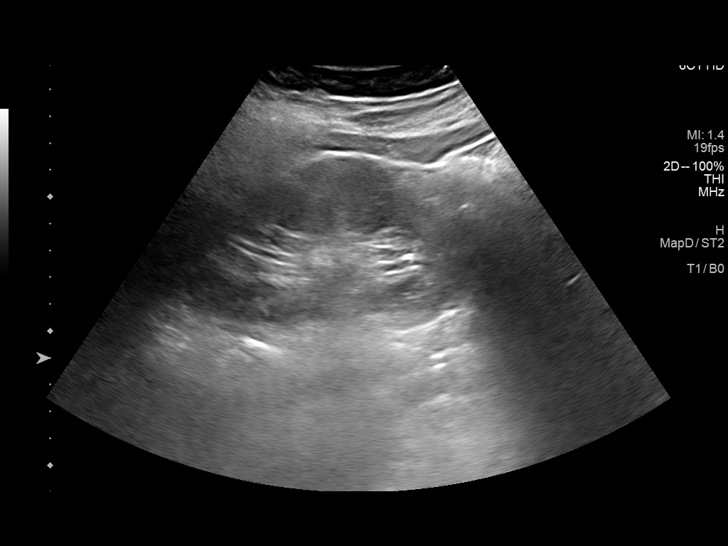
[im 37/45]
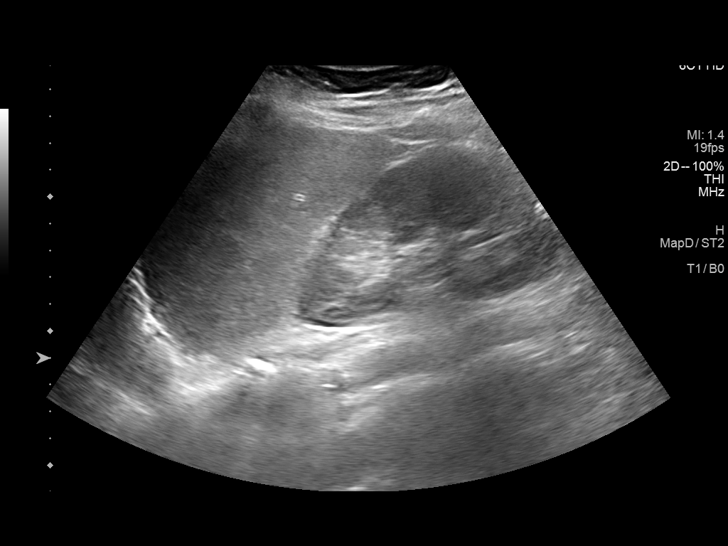
[im 41/45]
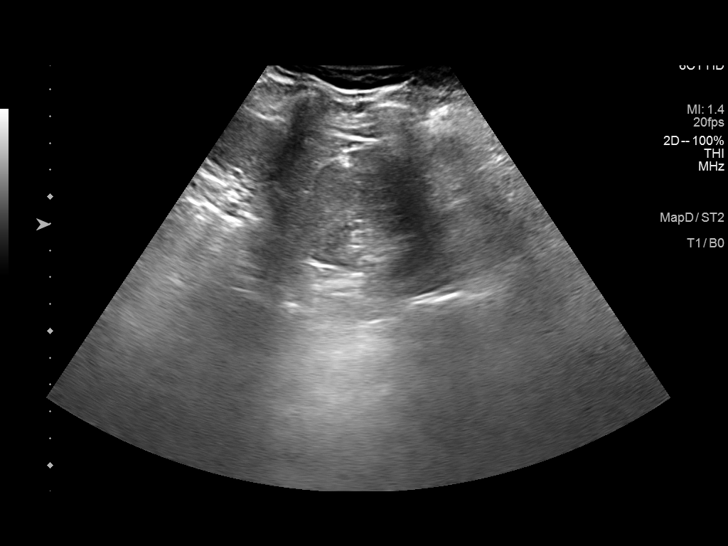
[im 45/45]
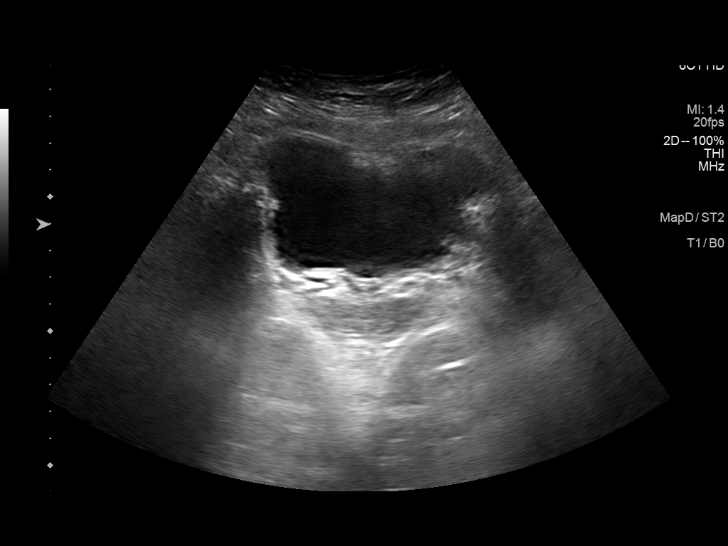

[13 of 25 positions shown; findings below may reference images not displayed]

FINDINGS: Right Kidney:

Renal measurements: 10.8 x 4.8 x 4.7 cm = volume: 129 mL.
Echogenicity within normal limits. No mass or hydronephrosis
visualized.

Left Kidney:

Renal measurements: 11.7 x 6.5 x 5.6 cm = volume: 220 mL.
Echogenicity within normal limits. No hydronephrosis visualized. In
the interpolar kidney along the peripheral cortex, there is an
echogenic focus without discrete shadowing. It measures 8 x 4 x 6
mm, stable in comparison to prior given differences in scan plane
technique and caliper placement.

Bladder:

Appears normal for degree of bladder distention.

Other:

Increased hepatic echogenicity as can be seen in hepatic steatosis.
IMPRESSION: 1. No hydronephrosis.
2. Stable appearance of an 8 mm echogenic masslike area of the
interpolar LEFT kidney. Differential considerations include a benign
angiomyolipoma or cortical calcification. If further imaging is
desired, nonemergent cross-sectional imaging (CT or MRI) could
considered to assist in definitive characterization. This should
only be performed when clinically appropriate given gravid state.
3. Increased hepatic echogenicity as can be seen in hepatic
steatosis

## 2020-11-24 ENCOUNTER — Ambulatory Visit: Payer: Medicaid Other | Attending: Obstetrics and Gynecology

## 2020-11-24 ENCOUNTER — Encounter: Payer: Self-pay | Admitting: *Deleted

## 2020-11-24 ENCOUNTER — Other Ambulatory Visit: Payer: Self-pay

## 2020-11-24 ENCOUNTER — Other Ambulatory Visit: Payer: Self-pay | Admitting: *Deleted

## 2020-11-24 ENCOUNTER — Ambulatory Visit: Payer: Medicaid Other | Admitting: *Deleted

## 2020-11-24 VITALS — BP 122/74 | HR 86

## 2020-11-24 DIAGNOSIS — O09899 Supervision of other high risk pregnancies, unspecified trimester: Secondary | ICD-10-CM

## 2020-11-24 DIAGNOSIS — Z8632 Personal history of gestational diabetes: Secondary | ICD-10-CM

## 2020-11-24 DIAGNOSIS — O09299 Supervision of pregnancy with other poor reproductive or obstetric history, unspecified trimester: Secondary | ICD-10-CM | POA: Diagnosis present

## 2020-11-24 DIAGNOSIS — O09529 Supervision of elderly multigravida, unspecified trimester: Secondary | ICD-10-CM | POA: Diagnosis present

## 2020-11-24 DIAGNOSIS — O09522 Supervision of elderly multigravida, second trimester: Secondary | ICD-10-CM

## 2020-11-24 IMAGING — US US MFM OB DETAIL+14 WK
1 series · 15 of 28 positions shown · non-contrast
Comparison: none

[Series 1: us mfm ob detail+14 wk · 15 of 156 slices shown]
[im 1/156]
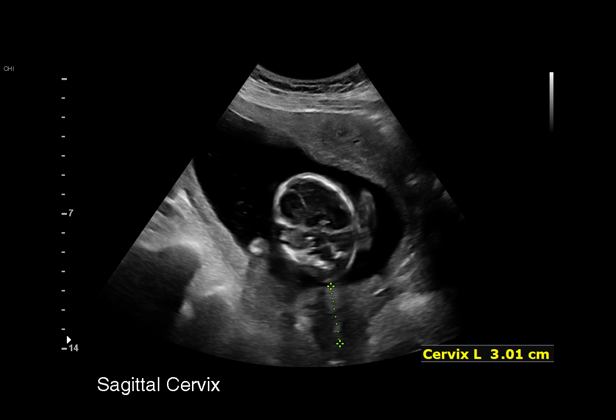
[im 12/156]
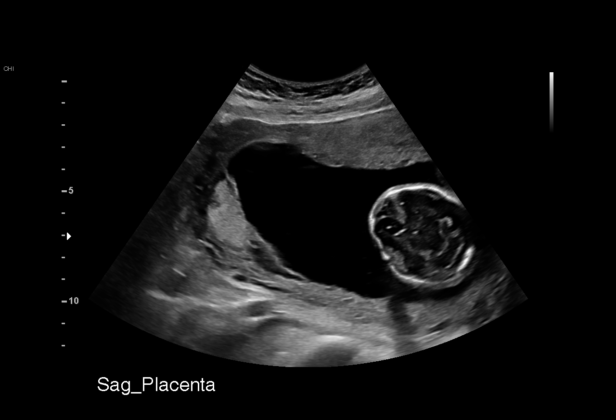
[im 23/156]
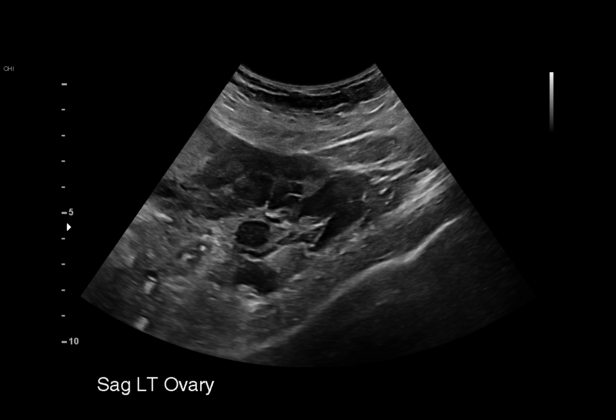
[im 35/156]
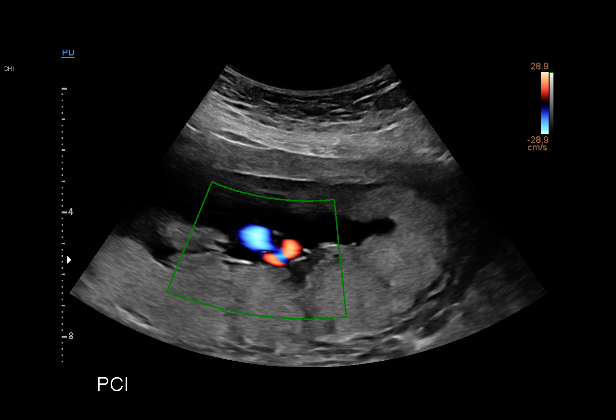
[im 46/156]
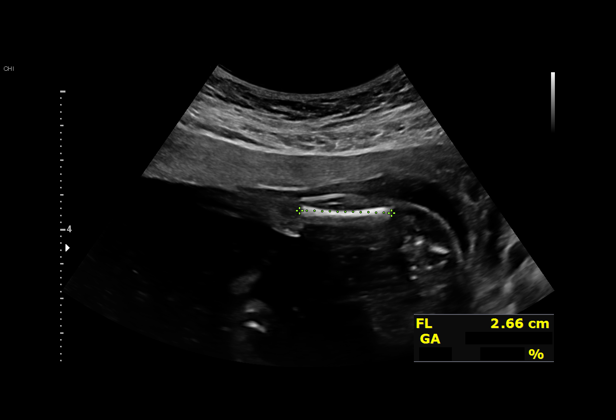
[im 58/156]
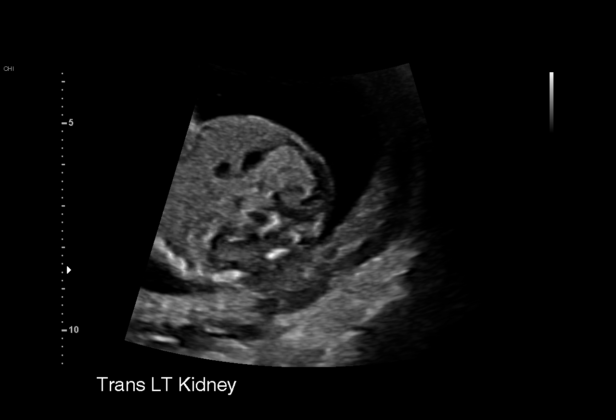
[im 69/156]
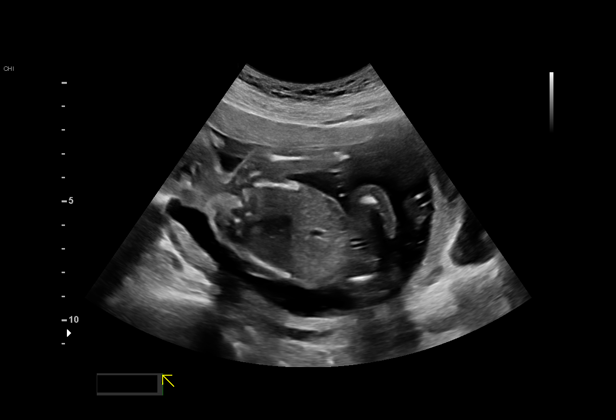
[im 81/156]
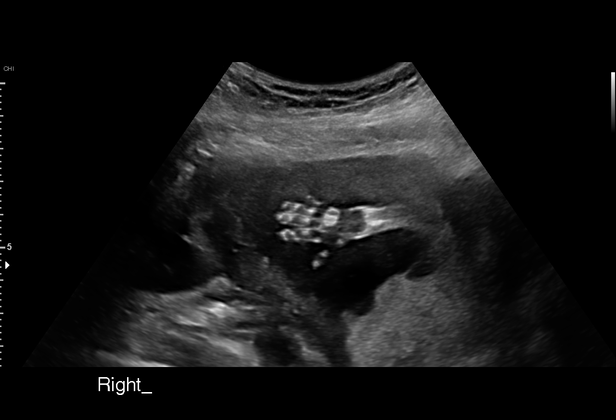
[im 87/156]
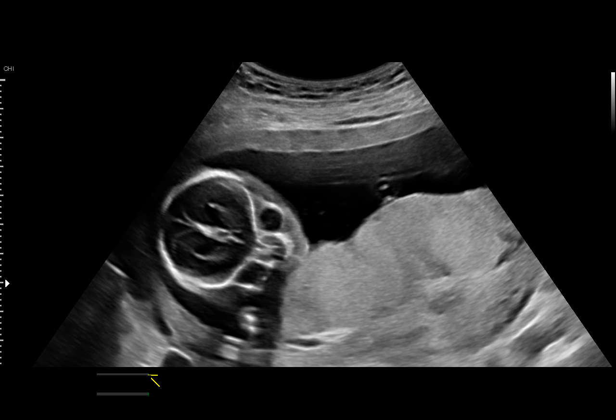
[im 98/156]
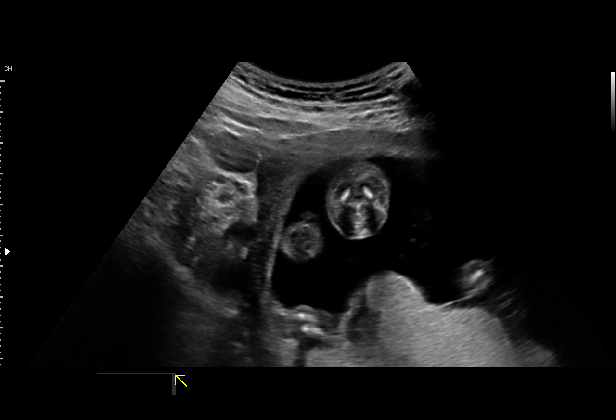
[im 110/156]
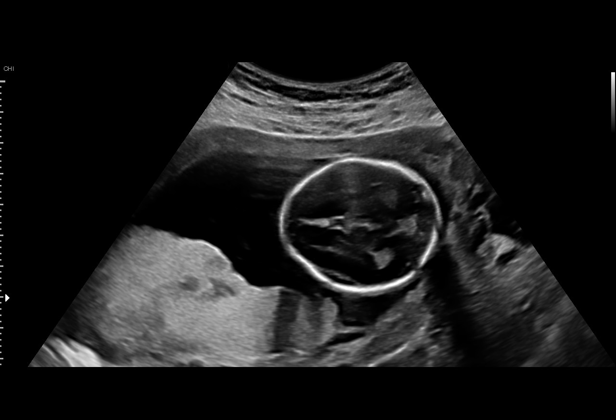
[im 121/156]
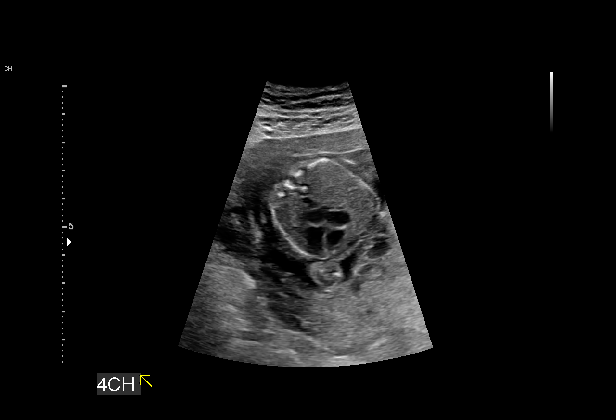
[im 133/156]
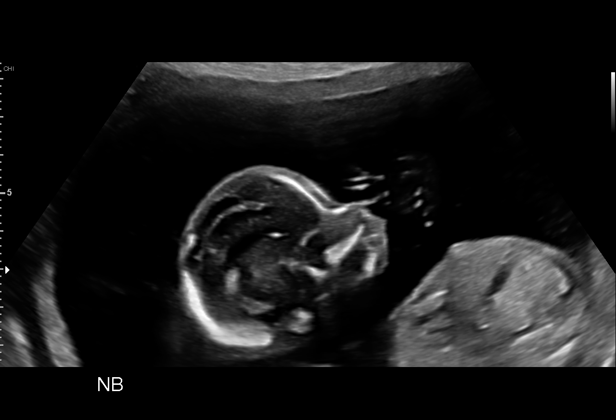
[im 144/156]
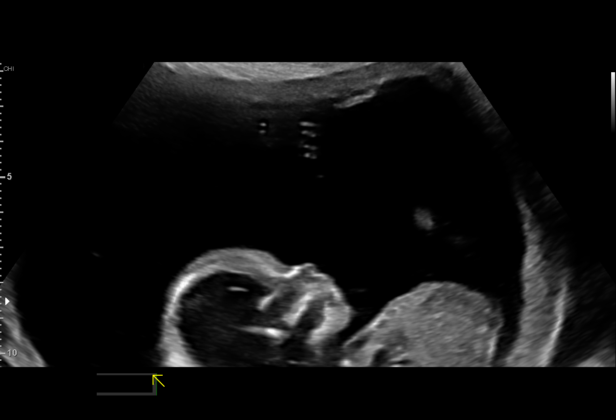
[im 156/156]
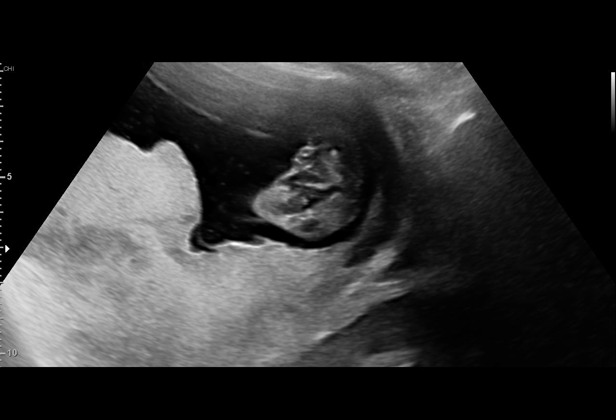

[15 of 28 positions shown; findings below may reference images not displayed]

Addendum:\.br----------------------------------------------------------------------

Indications

 Advanced maternal age multigravida 35+,        [VR]
 second trimester
 Short interval between pregancies, 2nd         [VR]
 trimester
 Encounter for antenatal screening for          [VR]
 malformations
 Declined genetic testing
 19 weeks gestation of pregnancy
Fetal Evaluation

 Num Of Fetuses:         1
 Fetal Heart Rate(bpm):  137
 Cardiac Activity:       Observed
 Presentation:           Variable
 Placenta:               Posterior Fundal
 P. Cord Insertion:      Visualized, central

 Amniotic Fluid
 AFI FV:      Within normal limits

                             Largest Pocket(cm)

Biometry

 BPD:      41.9  mm     G. Age:  18w 5d         37  %    CI:        74.49   %    70 - 86
                                                         FL/HC:      16.7   %    16.1 -
 HC:      154.1  mm     G. Age:  18w 3d         16  %    HC/AC:      1.11        1.09 -
 AC:      139.2  mm     G. Age:  19w 2d         57  %    FL/BPD:     61.6   %
 FL:       25.8  mm     G. Age:  17w 6d          9  %    FL/AC:      18.5   %    20 - 24
 HUM:      26.2  mm     G. Age:  18w 1d         30  %
 CER:      18.2  mm     G. Age:  18w 0d          5  %
 NFT:       4.2  mm

 LV:        5.8  mm
 CM:          2  mm

 Est. FW:     248  gm      0 lb 9 oz     24  %
OB History

 Gravidity:    2         Term:   1
 Living:       1
Gestational Age

 LMP:           19w 0d        Date:  [DATE]                 EDD:   [DATE]
 U/S Today:     18w 4d                                        EDD:   [DATE]
 Best:          19w 0d     Det. By:  LMP  ([DATE])          EDD:   [DATE]
Anatomy

 Cranium:               Appears normal         LVOT:                   Not well visualized
 Cavum:                 Appears normal         Aortic Arch:            Not well visualized
 Ventricles:            Appears normal         Ductal Arch:            Not well visualized
 Choroid Plexus:        Appears normal         Diaphragm:              Appears normal
 Cerebellum:            Appears normal         Stomach:                Appears normal, left
                                                                       sided
 Posterior Fossa:       Appears normal         Abdomen:                Appears normal
 Nuchal Fold:           Appears normal         Abdominal Wall:         Appears nml (cord
                                                                       insert, abd wall)
 Face:                  Appears normal         Cord Vessels:           Appears normal (3
                        (orbits and profile)                           vessel cord)
 Lips:                  Appears normal         Kidneys:                Appear normal
 Palate:                Not well visualized    Bladder:                Appears normal
 Thoracic:              Appears normal         Spine:                  Appears normal
 Heart:                 Appears normal         Upper Extremities:      Appears normal
                        (4CH, axis, and
                        situs)
 RVOT:                  Not well visualized    Lower Extremities:      Appears normal

 Other:  Gender female. Nasal bone and lenses visualized. Heels/feet and
         open hands visualized.  Technically difficult due to fetal position.
Cervix Uterus Adnexa

 Cervix
 Length:           3.01  cm.
 Normal appearance by transabdominal scan.

 Uterus
 No abnormality visualized.

 Right Ovary
 Within normal limits.
 Left Ovary
 Within normal limits.

 Cul De Sac
 No free fluid seen.

 Adnexa
 No abnormality visualized.
Impression

 Single intrauterine pregnancy here for a detailed anatomy for
 advanced maternal age.
 Normal anatomy with measurements consistent with dates
 There is good fetal movement and amniotic fluid volume
 Suboptimal views of the fetal anatomy were obtained
 secondary to fetal position.
Recommendations

 Follow up growth in 4 weeks.

*** End of Addendum ***\.br----------------------------------------------------------------------

Indications

 Advanced maternal age multigravida 35+,        [VR]
 second trimester
 Short interval between pregancies, 2nd         [VR]
 trimester
 Encounter for antenatal screening for          [VR]
 malformations
 Declined genetic testing
 19 weeks gestation of pregnancy
Fetal Evaluation

 Num Of Fetuses:         1
 Fetal Heart Rate(bpm):  137
 Cardiac Activity:       Observed
 Presentation:           Variable
 Placenta:               Posterior Fundal
 P. Cord Insertion:      Visualized, central

 Amniotic Fluid
 AFI FV:      Within normal limits

                             Largest Pocket(cm)

Biometry

 BPD:      41.9  mm     G. Age:  18w 5d         37  %    CI:        74.49   %    70 - 86
                                                         FL/HC:      16.7   %    16.1 -
 HC:      154.1  mm     G. Age:  18w 3d         16  %    HC/AC:      1.11        1.09 -
 AC:      139.2  mm     G. Age:  19w 2d         57  %    FL/BPD:     61.6   %
 FL:       25.8  mm     G. Age:  17w 6d          9  %    FL/AC:      18.5   %    20 - 24
 HUM:      26.2  mm     G. Age:  18w 1d         30  %
 CER:      18.2  mm     G. Age:  18w 0d          5  %
 NFT:       4.2  mm

 LV:        5.8  mm
 CM:          2  mm

 Est. FW:     248  gm      0 lb 9 oz     24  %
OB History

 Gravidity:    2         Term:   1
 Living:       1
Gestational Age

 LMP:           19w 0d        Date:  [DATE]                 EDD:   [DATE]
 U/S Today:     18w 4d                                        EDD:   [DATE]
 Best:          19w 0d     Det. By:  LMP  ([DATE])          EDD:   [DATE]
Anatomy

 Cranium:               Appears normal         LVOT:                   Not well visualized
 Cavum:                 Appears normal         Aortic Arch:            Not well visualized
 Ventricles:            Appears normal         Ductal Arch:            Not well visualized
 Choroid Plexus:        Appears normal         Diaphragm:              Appears normal
 Cerebellum:            Appears normal         Stomach:                Appears normal, left
                                                                       sided
 Posterior Fossa:       Appears normal         Abdomen:                Appears normal
 Nuchal Fold:           Appears normal         Abdominal Wall:         Appears nml (cord
                                                                       insert, abd wall)
 Face:                  Appears normal         Cord Vessels:           Appears normal (3
                        (orbits and profile)                           vessel cord)
 Lips:                  Appears normal         Kidneys:                Appear normal
 Palate:                Not well visualized    Bladder:                Appears normal
 Thoracic:              Appears normal         Spine:                  Appears normal
 Heart:                 Appears normal         Upper Extremities:      Appears normal
                        (4CH, axis, and
                        situs)
 RVOT:                  Not well visualized    Lower Extremities:      Appears normal

 Other:  Gender not well visualized. Nasal bone and lenses visualized.
         Heels/feet and open hands visualized.  Technically difficult due to
         fetal position.
Cervix Uterus Adnexa

 Cervix
 Length:           3.01  cm.
 Normal appearance by transabdominal scan.

 Uterus
 No abnormality visualized.

 Right Ovary
 Within normal limits.
 Left Ovary
 Within normal limits.

 Cul De Sac
 No free fluid seen.

 Adnexa
 No abnormality visualized.
Impression

 Single intrauterine pregnancy here for a detailed anatomy for
 advanced maternal age.
 Normal anatomy with measurements consistent with dates
 There is good fetal movement and amniotic fluid volume
 Suboptimal views of the fetal anatomy were obtained
 secondary to fetal position.
Recommendations

 Follow up growth in 4 weeks.

## 2020-12-07 ENCOUNTER — Other Ambulatory Visit: Payer: Self-pay

## 2020-12-07 ENCOUNTER — Encounter: Payer: Self-pay | Admitting: Obstetrics and Gynecology

## 2020-12-07 ENCOUNTER — Ambulatory Visit (INDEPENDENT_AMBULATORY_CARE_PROVIDER_SITE_OTHER): Payer: Medicaid Other | Admitting: Obstetrics and Gynecology

## 2020-12-07 VITALS — BP 105/65 | HR 87 | Wt 150.9 lb

## 2020-12-07 DIAGNOSIS — O09299 Supervision of pregnancy with other poor reproductive or obstetric history, unspecified trimester: Secondary | ICD-10-CM

## 2020-12-07 DIAGNOSIS — O09899 Supervision of other high risk pregnancies, unspecified trimester: Secondary | ICD-10-CM

## 2020-12-07 DIAGNOSIS — Z8632 Personal history of gestational diabetes: Secondary | ICD-10-CM

## 2020-12-07 DIAGNOSIS — O09522 Supervision of elderly multigravida, second trimester: Secondary | ICD-10-CM

## 2020-12-07 DIAGNOSIS — Z789 Other specified health status: Secondary | ICD-10-CM

## 2020-12-07 NOTE — Progress Notes (Signed)
Patient declined using an interpreter, stated "English is fine". Patient stated that she is feeling fine and has no question. However prefers female providers for future appointments

## 2020-12-07 NOTE — Progress Notes (Signed)
Subjective:  Carol Rangel is a 35 y.o. G2P1001 at [redacted]w[redacted]d being seen today for ongoing prenatal care.  She is currently monitored for the following issues for this high-risk pregnancy and has Sciatica of right side; Language barrier affecting health care; Supervision of other high risk pregnancy, antepartum; History of gestational diabetes in prior pregnancy, currently pregnant; Short interval between pregnancies affecting pregnancy, antepartum; AMA (advanced maternal age) multigravida 35+; and Anemia on their problem list.  Patient reports no complaints.  Contractions: Not present. Vag. Bleeding: None.  Movement: Present. Denies leaking of fluid.   The following portions of the patient's history were reviewed and updated as appropriate: allergies, current medications, past family history, past medical history, past social history, past surgical history and problem list. Problem list updated.  Objective:   Vitals:   12/07/20 1551  BP: 105/65  Pulse: 87  Weight: 150 lb 14.4 oz (68.4 kg)    Fetal Status: Fetal Heart Rate (bpm): 145   Movement: Present     General:  Alert, oriented and cooperative. Patient is in no acute distress.  Skin: Skin is warm and dry. No rash noted.   Cardiovascular: Normal heart rate noted  Respiratory: Normal respiratory effort, no problems with respiration noted  Abdomen: Soft, gravid, appropriate for gestational age. Pain/Pressure: Absent     Pelvic:  Cervical exam deferred        Extremities: Normal range of motion.  Edema: None  Mental Status: Normal mood and affect. Normal behavior. Normal judgment and thought content.   Urinalysis:      Assessment and Plan:  Pregnancy: G2P1001 at [redacted]w[redacted]d  1. Supervision of other high risk pregnancy, antepartum Stable Growth scan next month  2. Multigravida of advanced maternal age in second trimester Negative genetic testing  3. Short interval between pregnancies affecting pregnancy, antepartum Stable  4. History of  gestational diabetes in prior pregnancy, currently pregnant Normal A1c  5. Language barrier affecting health care Video interrupter used during today's visit  Preterm labor symptoms and general obstetric precautions including but not limited to vaginal bleeding, contractions, leaking of fluid and fetal movement were reviewed in detail with the patient. Please refer to After Visit Summary for other counseling recommendations.  Return in about 4 weeks (around 01/04/2021) for OB visit, face to face, any provider, prefers female providers.   Hermina Staggers, MD

## 2020-12-07 NOTE — Patient Instructions (Signed)
Vaginal Delivery ?Vaginal delivery means that you give birth by pushing your baby out of your birth canal (vagina). Your health care team will help you before, during, and after vaginal delivery. ?Birth experiences are unique for every woman and every pregnancy, and birth experiences vary depending on where you choose to give birth. ?What are the risks and benefits? ?Generally, this is safe. However, problems may occur, including: ?Bleeding. ?Infection. ?Damage to other structures such as vaginal tearing. ?Allergic reactions to medicines. ?Despite the risks, benefits of vaginal delivery include less risk of bleeding and infection and a shorter recovery time compared to a Cesarean delivery. Cesarean delivery, or C-section, is the surgical delivery of a baby. ?What happens when I arrive at the birth center or hospital? ?Once you are in labor and have been admitted into the hospital or birth center, your health care team may: ?Review your pregnancy history and any concerns that you have. ?Talk with you about your birth plan and discuss pain control options. ?Check your blood pressure, breathing, and heartbeat. ?Assess your baby's heartbeat. ?Monitor your uterus for contractions. ?Check whether your bag of water (amniotic sac) has broken (ruptured). ?Insert an IV into one of your veins. This may be used to give you fluids and medicines. ?Monitoring ?Your health care team may assess your contractions (uterine monitoring) and your baby's heart rate (fetal monitoring). You may need to be monitored: ?Often, but not continuously (intermittently). ?All the time or for long periods at a time (continuously). Continuous monitoring may be needed if: ?You are taking certain medicines, such as medicine to relieve pain or make your contractions stronger. ?You have pregnancy or labor complications. ?Monitoring may be done by: ?Placing a special stethoscope or a handheld monitoring device on your abdomen to check your baby's heartbeat  and to check for contractions. ?Placing monitors on your abdomen (external monitors) to record your baby's heartbeat and the frequency and length of contractions. ?Placing monitors inside your uterus through your vagina (internal monitors) to record your baby's heartbeat and the frequency, length, and strength of your contractions. Depending on the type of monitor, it may remain in your uterus or on your baby's head until birth. ?Telemetry. This is a type of continuous monitoring that can be done with external or internal monitors. Instead of having to stay in bed, you are able to move around. ?Physical exam ?Your health care team may perform frequent physical exams. This may include: ?Checking how and where your baby is positioned in your uterus. ?Checking your cervix to determine: ?Whether it is thinning out (effacing). ?Whether it is opening up (dilating). ?What happens during labor and delivery? ?Normal labor and delivery is divided into the following three stages: ?Stage 1 ?This is the longest stage of labor. ?Throughout this stage, you will feel contractions. Contractions generally feel mild, infrequent, and irregular at first. They get stronger, more frequent, and more regular as you move through this stage. You may have contractions about every 2-3 minutes. ?This stage ends when your cervix is completely dilated to 4 inches (10 cm) and completely effaced. ?Stage 2 ?This stage starts once your cervix is completely effaced and dilated and lasts until the delivery of your baby. ?This is the stage where you will feel an urge to push your baby out of your vagina. ?You may feel stretching and burning pain, especially when the widest part of your baby's head passes through the vaginal opening (crowning). ?Once your baby is delivered, the umbilical cord will be clamped and   cut. Timing of cutting the cord will depend on your wishes, your baby's health, and your health care provider's practices. ?Your baby will be  placed on your bare chest (skin-to-skin contact) in an upright position and covered with a warm blanket. If you are choosing to breastfeed, watch your baby for feeding cues, like rooting or sucking, and help the baby to your breast for his or her first feeding. ?Stage 3 ?This stage starts immediately after the birth of your baby and ends after you deliver the placenta. ?This stage may take anywhere from 5 to 30 minutes. ?After your baby has been delivered, you will feel contractions as your body expels the placenta. These contractions also help your uterus get smaller and reduce bleeding. ?What can I expect after labor and delivery? ?After labor is over, you and your baby will be assessed closely until you are ready to go home. Your health care team will teach you how to care for yourself and your baby. ?You and your baby may be encouraged to stay in the same room (rooming in) during your hospital stay. This will help promote early bonding and successful breastfeeding. ?Your uterus will be checked and massaged regularly (fundal massage). ?You may continue to receive fluids and medicines through an IV. ?You will have some soreness and pain in your abdomen, vagina, and the area of skin between your vaginal opening and your anus (perineum). ?If an incision was made near your vagina (episiotomy) or if you had some vaginal tearing during delivery, cold compresses may be placed on your episiotomy or your tear. This helps to reduce pain and swelling. ?It is normal to have vaginal bleeding after delivery. Wear a sanitary pad for vaginal bleeding and discharge. ?Summary ?Vaginal delivery means that you will give birth by pushing your baby out of your birth canal (vagina). ?Your health care team will monitor you and your baby throughout the stages of labor. ?After you deliver your baby, your health care team will continue to assess you and your baby to ensure you are both recovering as expected after delivery. ?This  information is not intended to replace advice given to you by your health care provider. Make sure you discuss any questions you have with your health care provider. ?Document Revised: 01/31/2020 Document Reviewed: 01/31/2020 ?[employer]vier Patient Education ? 2022 [employer] ? ?

## 2020-12-14 ENCOUNTER — Encounter: Payer: Self-pay | Admitting: Physical Therapy

## 2020-12-14 ENCOUNTER — Ambulatory Visit: Payer: Medicaid Other | Attending: Obstetrics and Gynecology | Admitting: Physical Therapy

## 2020-12-14 ENCOUNTER — Other Ambulatory Visit: Payer: Self-pay

## 2020-12-14 DIAGNOSIS — R279 Unspecified lack of coordination: Secondary | ICD-10-CM | POA: Diagnosis present

## 2020-12-14 DIAGNOSIS — M6281 Muscle weakness (generalized): Secondary | ICD-10-CM

## 2020-12-14 NOTE — Patient Instructions (Signed)
Urge Incontinence  Ideal urination frequency is every 2-4 wakeful hours, which equates to 5-8 times within a 24-hour period.   Urge incontinence is leakage that occurs when the bladder muscle contracts, creating a sudden need to go before getting to the bathroom.   Going too often when your bladder isn't actually full can disrupt the body's automatic signals to store and hold urine longer, which will increase urgency/frequency.  In this case, the bladder "is running the show" and strategies can be learned to retrain this pattern.   One should be able to control the first urge to urinate, at around .  The bladder can hold up to a "grande latte," or . To help you gain control, practice the Urge Drill below when urgency strikes.  This drill will help retrain your bladder signals and allow you to store and hold urine longer.  The overall goal is to stretch out your time between voids to reach a more manageable voiding schedule.    Practice your "quick flicks" often throughout the day (each waking hour) even when you don't need feel the urge to go.  This will help strengthen your pelvic floor muscles, making them more effective in controlling leakage.  Urge Drill  When you feel an urge to go, follow these steps to regain control: Stop what you are doing and be still Take one deep breath, directing your air into your abdomen Think an affirming thought, such as "I've got this." Do 5 quick flicks of your pelvic floor Walk with control to the bathroom to void, or delay voiding   Relaxation Exercises with the Urge to Void   When you experience an urge to void:  FIRST  Stop and stand very still    Sit down if you can    Don't move    You need to stay very still to maintain control  SECOND Squeeze your pelvic floor muscles 5 times, like a quick flick, to keep from leaking  THIRD Relax  Take a deep breath and then let it out  Try to make the urge go away by using relaxation and  visualization techniques  FINALLY When you feel the urge go away somewhat, walk normally to the bathroom.   If the urge gets suddenly stronger on the way, you may stop again and relax to regain control.    THE KNACK  The Knack is a strategy you may use to help to reduce or prevent leakage or passing of urine, gas or feces during an activity that causes downward force on the pelvic floor muscles.    Activities that can cause downward pressure on the pelvic floor muscles include coughing, sneezing, laughing, bending, lifting, and transitioning from different body positions such as from laying down to sitting up and sitting to standing.  To perform The Knack, consciously squeeze and lift your pelvic floor muscles to perform a strong, well-timed pelvic muscle contraction BEFORE AND DURING these activities above.  As your contraction gets more coordinated and your muscles get stronger, you will become more effective in controlling your experience of incontinence or gas passing during these activities.    Bladder Irritants  Certain foods and beverages can be irritating to the bladder.  Avoiding these irritants may decrease your symptoms of urinary urgency, frequency or bladder pain.  Even reducing your intake can help with your symptoms.  Not everyone is sensitive to all bladder irritants, so you may consider focusing on one irritant at a time, removing or reducing  your intake of that irritant for 7-10 days to see if this change helps your symptoms.  Water intake is also very important.  Below is a list of bladder irritants.  Drinks: alcohol, carbonated beverages, caffeinated beverages such as coffee and tea, drinks with artificial sweeteners, citrus juices, apple juice, tomato juice  Foods: tomatoes and tomato based foods, spicy food, sugar and artificial sweeteners, vinegar, chocolate, raw onion, apples, citrus fruits, pineapple, cranberries, tomatoes, strawberries, plums, peaches,  cantaloupe  Other: acidic urine (too concentrated) - see water intake info below  Substitutes you can try that are NOT irritating to the bladder: cooked onion, pears, papayas, sun-brewed decaf teas, watermelons, non-citrus herbal teas, apricots, kava and low-acid instant drinks (Postum).    WATER INTAKE: Remember to drink lots of water (aim for fluid intake of half your body weight with 2/3 of fluids being water).  You may be limiting fluids due to fear of leakage, but this can actually worsen urgency symptoms due to highly concentrated urine.  Water helps balance the pH of your urine so it doesn't become too acidic - acidic urine is a bladder irritant!

## 2020-12-14 NOTE — Therapy (Addendum)
[facility] [address], [place], [place], [address] Phone: [phone]   Fax:  [phone]  Physical Therapy Evaluation  Patient Details  Name: Carol Rangel MRN: [id number] Date of Birth: 1985-04-15 Referring Provider (PT): Mora Bellman, MD   Encounter Date: 12/14/2020   PT End of Session - 12/14/20 1724     Visit Number 1    Date for PT Re-Evaluation 03/15/21    Authorization Type [employer]    PT Start Time 1615    PT Stop Time 1655    PT Time Calculation (min) 40 min    Activity Tolerance Patient tolerated treatment well    Behavior During Therapy [facility] for tasks assessed/performed             Past Medical History:  Diagnosis Date   Gestational diabetes    Medical history non-contributory    Nausea with vomiting 12/20/2013   Sciatica of right side 09/24/2019    Past Surgical History:  Procedure Laterality Date   BREAST SURGERY Right    ?abscess surgery x2    There were no vitals filed for this visit.    Subjective Assessment - 12/14/20 1617     Subjective Pt reports urinary incontinence since birth of first child 11 months ago, is currently [redacted] weeks pregnant with second child, stressors with activity (walking, laughing, sneezing coughing) "everything" per pt. Pt reports getting up to urinate 0-2 times per night without leakage during sleep.    How long can you sit comfortably? no limits    How long can you stand comfortably? no limits    How long can you walk comfortably? no limits    Patient Stated Goals to have less leakage    Currently in Pain? No/denies                [facility] PT Assessment - 12/14/20 0001       Assessment   Medical Diagnosis R32 (ICD-10-CM) - Urinary incontinence, unspecified type    Referring Provider (PT) Constant, Peggy, MD    Onset Date/Surgical Date --   11 months ago   Prior Therapy no      Precautions   Precautions None      Restrictions   Weight Bearing  Restrictions No      Balance Screen   Has the patient fallen in the past 6 months No    Has the patient had a decrease in activity level because of a fear of falling?  No    Is the patient reluctant to leave their home because of a fear of falling?  No      Home Ecologist residence    Living Arrangements Spouse/significant other;Children      Prior Function   Level of Independence Independent      Cognition   Overall Cognitive Status Within Functional Limits for tasks assessed      Sensation   Light Touch Appears Intact      Coordination   Gross Motor Movements are Fluid and Coordinated Yes    Fine Motor Movements are Fluid and Coordinated Yes      Posture/Postural Control   Posture/Postural Control Postural limitations    Postural Limitations Increased thoracic kyphosis      ROM / Strength   AROM / PROM / Strength AROM;Strength      AROM   Overall AROM Comments decreased thoracic and lumbar mobility in rotation, side bending by 25%  Strength   Overall Strength Comments bil hips 4/5 grossly      Flexibility   Soft Tissue Assessment /Muscle Length yes   decreased hamstrings by 25%     Palpation   Spinal mobility decreased rib expansion, increase rib angle due to pregnancy    Palpation comment mild tenderness at pubic bone and pain in this area with hip abduction with resistance and at adduction with resistance.                        Objective measurements completed on examination: See above findings.     Pelvic Floor Special Questions - 12/14/20 0001     Prior Pelvic/Prostate Exam No    Are you Pregnant or attempting pregnancy? Yes    Prior Pregnancies Yes    Number of Pregnancies 1    Number of Vaginal Deliveries 1    Any difficulty with labor and deliveries Yes   2nd degree tear   Currently Sexually Active Yes    Is this Painful No    History of sexually transmitted disease No    Marinoff Scale no problems     Urinary Leakage Yes    How often daily multiple times    Pad use wears pads daily, changing pads sometimes every hour but on average 3-4x per day. Will have full loss of bladder sometimes    Activities that cause leaking With strong urge;Coughing;Sneezing;Laughing;Walking    Urinary urgency Yes    Urinary frequency ~every two hours but will leakage    Fecal incontinence No    Caffeine beverages yes-multiple cups per day however with pregnancy none    Falling out feeling (prolapse) No    Pelvic Floor Internal Exam pt deferred at this time for personal reasons                       PT Education - 12/14/20 1722     Education Details Pt educated on exam findings, taping for support at abdomen and how/when to remove, possible skin irritation and to remove with this, pt agreed; handouts for bladder irritants, urge drills    Person(s) Educated Patient    Methods Explanation;Demonstration;Tactile cues;Verbal cues;Handout    Comprehension Verbalized understanding;Returned demonstration              PT Short Term Goals - 12/14/20 1732       PT SHORT TERM GOAL #1   Title Pt to be I with HEP    Time 5    Period Weeks    Status New    Target Date 01/18/21      PT SHORT TERM GOAL #2   Title pt to report no more than 3 urinary leaks per day to decrease symptoms    Time 4    Period Weeks    Status New    Target Date 01/18/21      PT SHORT TERM GOAL #3   Title pt to demonstrate improved breathing mechanics with core/PF contract/relaxation at least 50% of the time for decreased strain at PF.    Time 5    Period Weeks    Status New    Target Date 01/18/21               PT Long Term Goals - 12/14/20 1733       PT LONG TERM GOAL #1   Title pt to be I with advanced HEP    Time  3    Period Months    Status New    Target Date 03/15/21      PT LONG TERM GOAL #2   Title pt to report no more than 3 urinary leaks per week to decrease symptoms    Time 3     Period Months    Status New    Target Date 03/15/21      PT LONG TERM GOAL #3   Title pt to demonstrate improved breathing mechanics with core/PF contract/relaxation at least 75% of the time for decreased strain at PF.    Time 3    Period Months    Status New    Target Date 03/15/21      PT LONG TERM GOAL #4   Title pt to demonstrate at least 4/5 pelvic floor strength for improved urinary symptoms    Time 3    Period Weeks    Status New    Target Date 03/15/21      PT LONG TERM GOAL #5   Title pt to demonstrate 5/5 bil hip strength to prep for birth    Time 3    Period Months    Status New    Target Date 03/15/21                    Plan - 12/14/20 1725     Clinical Impression Statement Pt is 35yo female [redacted] weeks pregnant with second child, first is 75 months old, and has had urinary leakage since birth of first child. Progressively worse to now, needs mulitple pads per day, leaks multiple times per day with all activity, gets up 1-2 times per night. Pt has history of gestational diabetes, currently high risk pregnancy with advanced maternal age and postpartum hemorrhage after first child. Pt found to have bil hip weakness, decreased spinal mobility, pain at pubic bone with palpation and with resisted hip mobility. Pt deferred internal pelvic assessment at this time and stated she would think about this for next session. Pt educated on bladder irritants, urge drill and the knack, HEP. Pt would benefit from additional PT for bladder retraining, breathing mechanincs, hip strengthening, birth prep and decreased leakage.    Personal Factors and Comorbidities Time since onset of injury/illness/exacerbation;Comorbidity 2;Fitness    Comorbidities postpartum, pregnant, tearing with previous delivery, gestational diabetes, 2 close pregnancies    Examination-Activity Limitations Sit;Squat;Stairs;Stand;Lift;Continence;Caring for Others    Examination-Participation Restrictions  Community Activity;Yard Work;Shop    Stability/Clinical Decision Making Evolving/Moderate complexity    Clinical Decision Making Moderate    Rehab Potential Good    PT Frequency Biweekly    PT Duration 8 weeks    PT Treatment/Interventions ADLs/Self Care Home Management;Aquatic Therapy;Functional mobility training;Therapeutic activities;Therapeutic exercise;Neuromuscular re-education;Manual techniques;Patient/family education;Passive range of motion;Energy conservation;Taping    PT Next Visit Plan go over taping, HEP, breathing mechanics, bladder retraining    PT Home Exercise Plan [id number]    Consulted and Agree with Plan of Care Patient             Patient will benefit from skilled therapeutic intervention in order to improve the following deficits and impairments:  Decreased coordination, Decreased range of motion, Increased fascial restricitons, Pain, Decreased endurance, Impaired flexibility, Improper body mechanics, Postural dysfunction, Decreased strength, Decreased mobility  Visit Diagnosis: Muscle weakness (generalized) - Plan: PT plan of care cert/re-cert  Lack of coordination - Plan: PT plan of care cert/re-cert     Problem List Patient Active Problem List  Diagnosis Date Noted   Anemia 11/09/2020   Supervision of other high risk pregnancy, antepartum 10/11/2020   History of gestational diabetes in prior pregnancy, currently pregnant 10/11/2020   Short interval between pregnancies affecting pregnancy, antepartum 10/11/2020   AMA (advanced maternal age) multigravida 35+ 10/11/2020   Language barrier affecting health care 10/28/2019   Sciatica of right side 09/24/2019   Stacy Gardner, PT, DPT 09/29/225:37 PM    PHYSICAL THERAPY DISCHARGE SUMMARY  Visits from Start of Care: 1 (initial eval)  Current functional level related to goals / functional outcomes: Unable to formally reassess as pt requests to be discharged until she delivers baby at MD request, per pt.     Remaining deficits: Unable to formally reassess as pt only present for initial evaluation   Education / Equipment: HEP   Patient agrees to discharge. Patient goals were not met. Patient is being discharged due to the physician's request. Pt called to cancel remaining appointments until after delivery at MD request.  Thank you for the referral.   [facility] [address], [place], [place], [address] Phone: [phone]   Fax:  [phone]  Name: Carol Rangel MRN: [id number] Date of Birth: 01/10/1986

## 2020-12-21 ENCOUNTER — Ambulatory Visit: Payer: Medicaid Other | Admitting: *Deleted

## 2020-12-21 ENCOUNTER — Other Ambulatory Visit: Payer: Self-pay

## 2020-12-21 ENCOUNTER — Ambulatory Visit: Payer: Medicaid Other | Attending: Maternal & Fetal Medicine

## 2020-12-21 ENCOUNTER — Encounter: Payer: Self-pay | Admitting: *Deleted

## 2020-12-21 VITALS — BP 110/62 | HR 75

## 2020-12-21 DIAGNOSIS — Z3A22 22 weeks gestation of pregnancy: Secondary | ICD-10-CM | POA: Diagnosis not present

## 2020-12-21 DIAGNOSIS — Z8632 Personal history of gestational diabetes: Secondary | ICD-10-CM | POA: Insufficient documentation

## 2020-12-21 DIAGNOSIS — O09892 Supervision of other high risk pregnancies, second trimester: Secondary | ICD-10-CM

## 2020-12-21 DIAGNOSIS — O09299 Supervision of pregnancy with other poor reproductive or obstetric history, unspecified trimester: Secondary | ICD-10-CM

## 2020-12-21 DIAGNOSIS — O09899 Supervision of other high risk pregnancies, unspecified trimester: Secondary | ICD-10-CM | POA: Diagnosis present

## 2020-12-21 DIAGNOSIS — O09522 Supervision of elderly multigravida, second trimester: Secondary | ICD-10-CM | POA: Diagnosis not present

## 2020-12-21 IMAGING — US US MFM OB FOLLOW-UP
1 series · 14 of 28 positions shown · non-contrast
Comparison: none

[Series 1: us mfm ob follow-up · 14 of 58 slices shown]
[im 3/58]
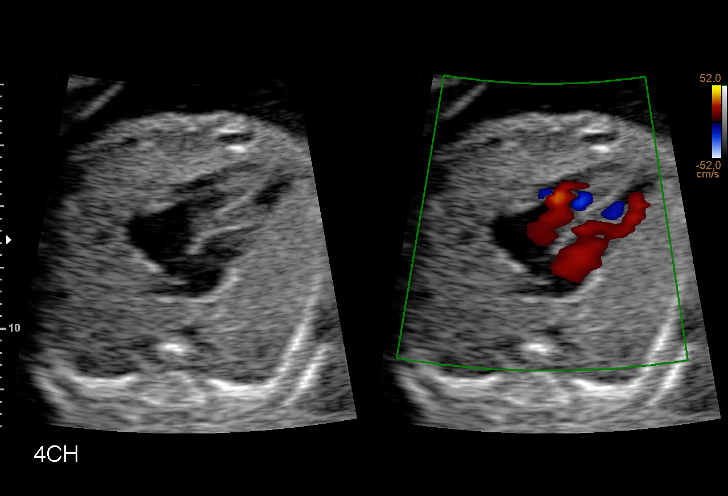
[im 7/58]
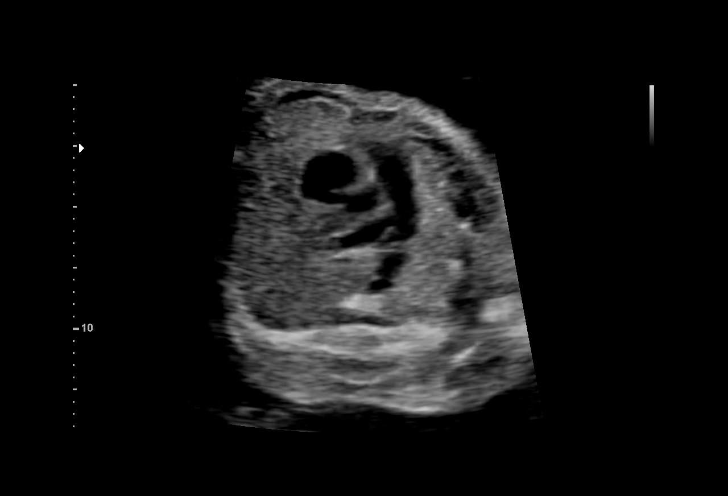
[im 11/58]
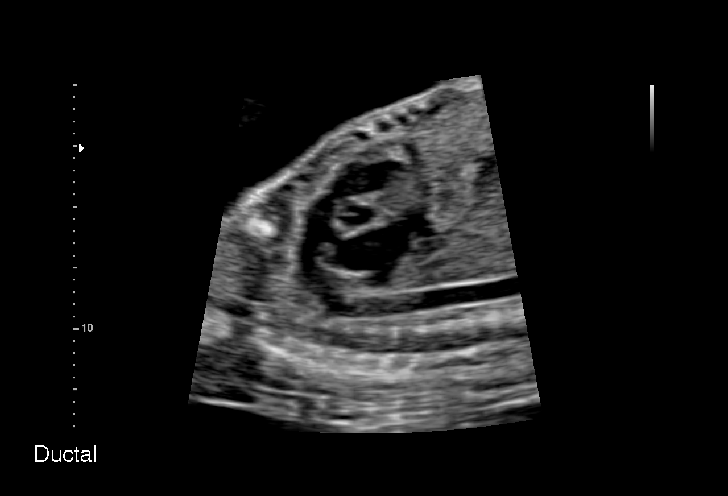
[im 15/58]
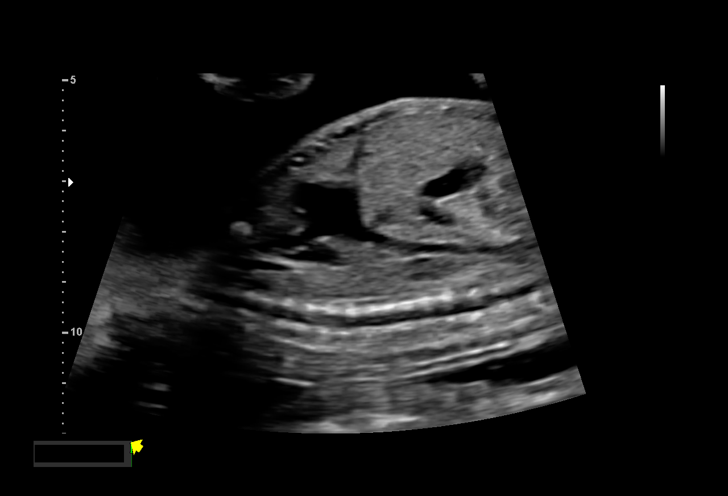
[im 20/58]
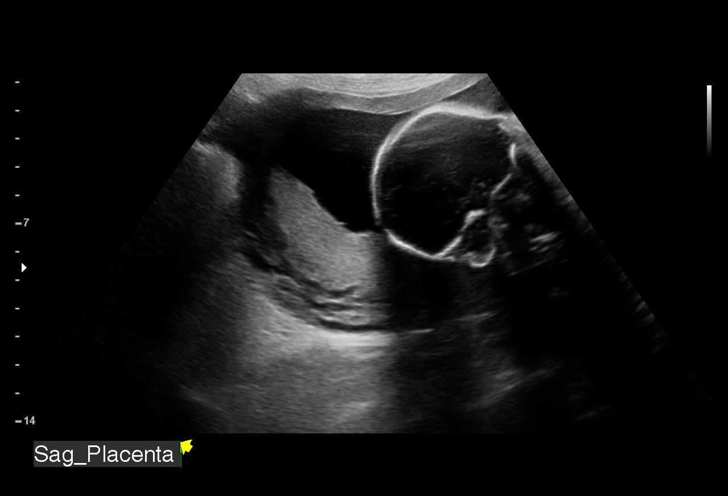
[im 24/58]
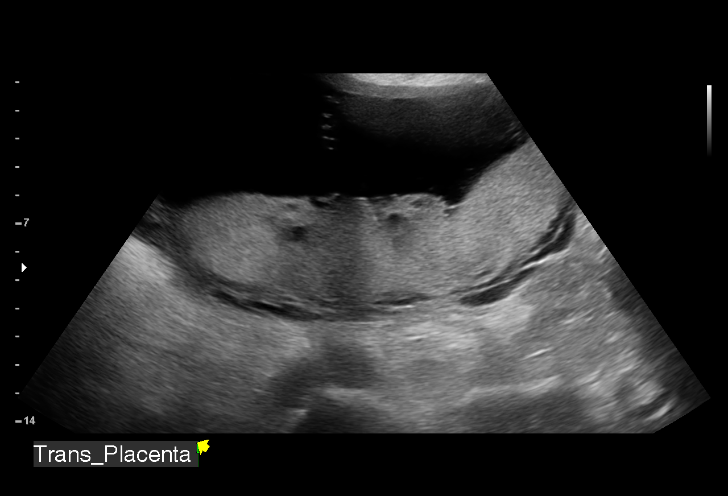
[im 28/58]
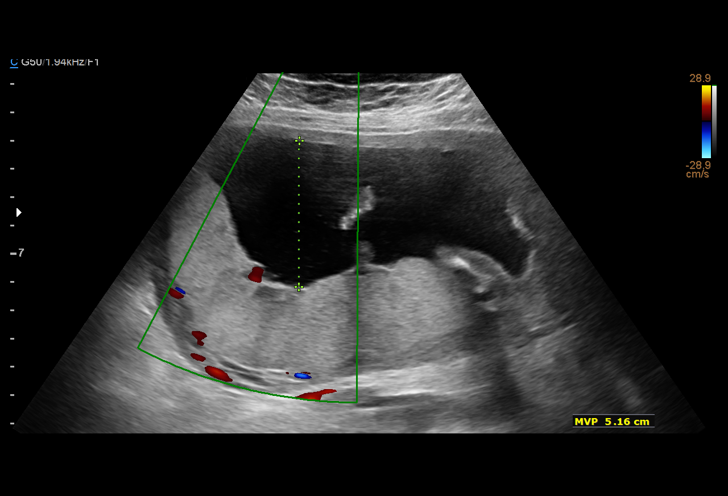
[im 32/58]
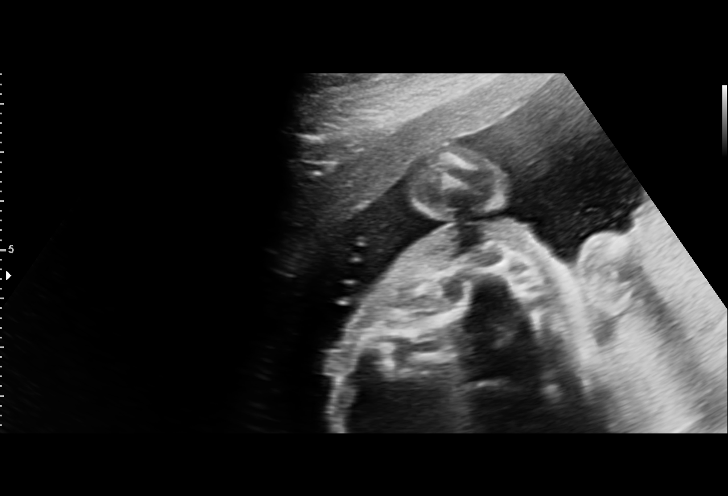
[im 36/58]
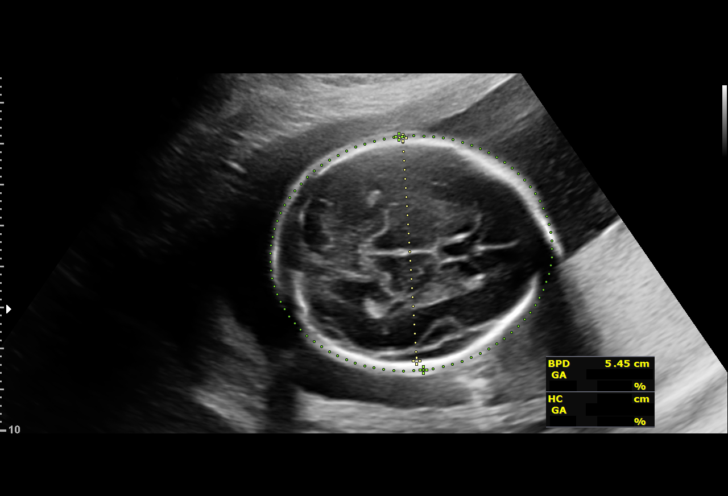
[im 41/58]
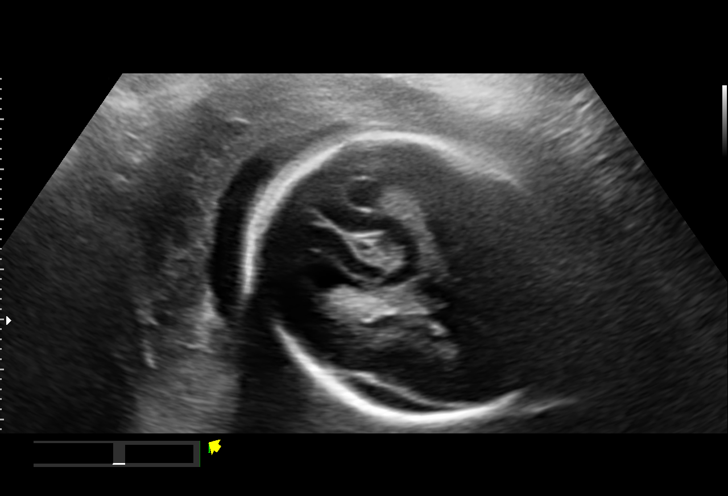
[im 45/58]
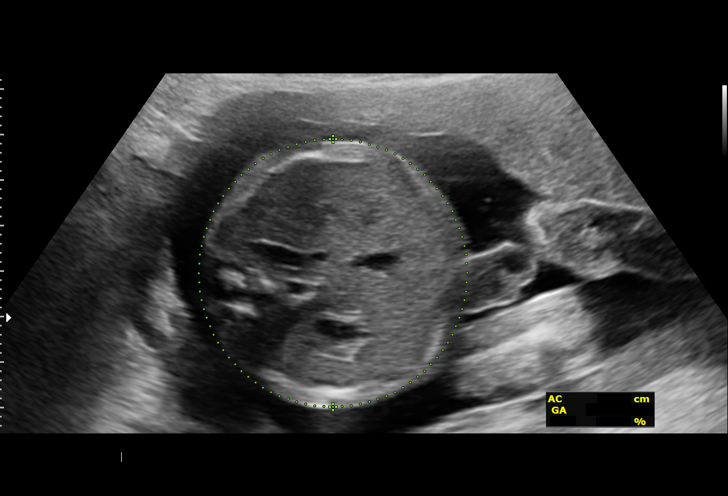
[im 49/58]
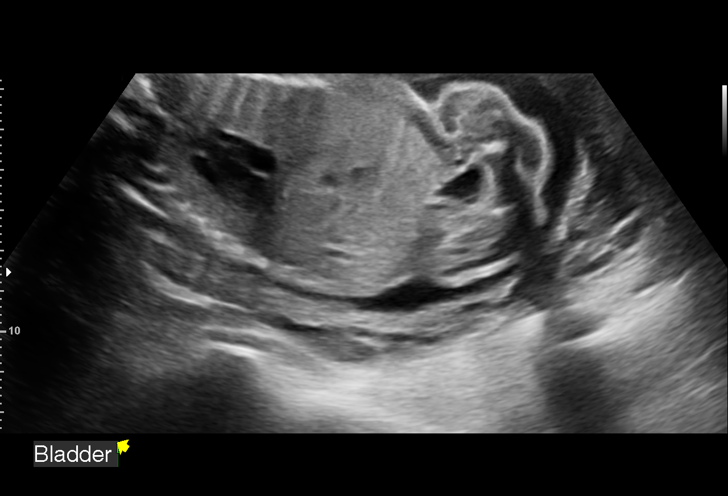
[im 53/58]
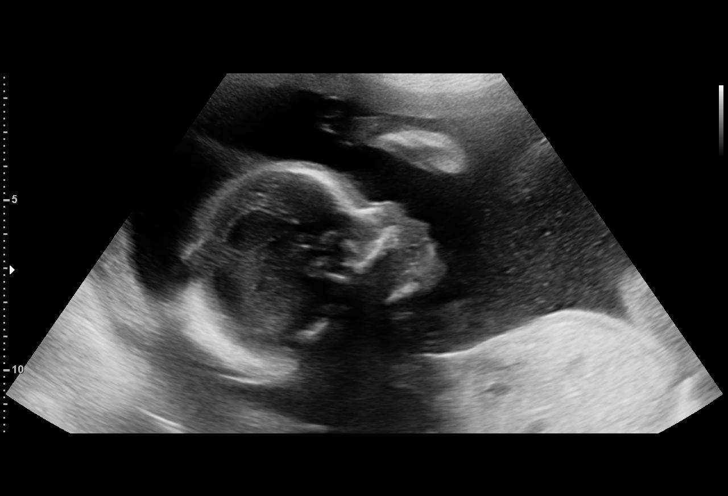
[im 58/58]
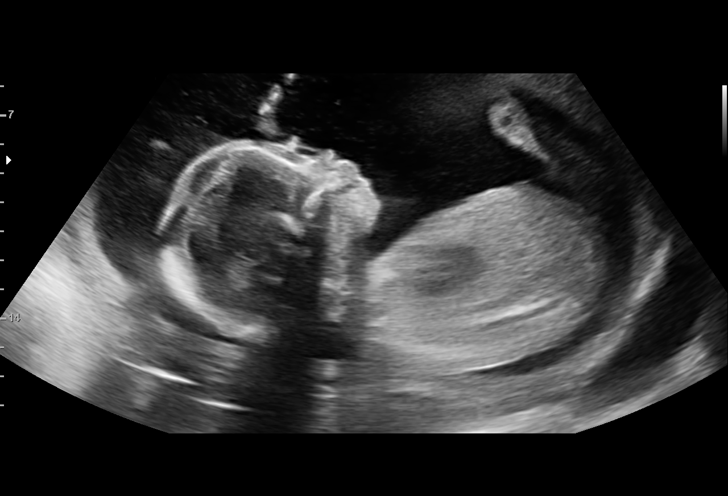

[14 of 28 positions shown; findings below may reference images not displayed]

WHIT

Indications

 Advanced maternal age multigravida 35+,         [4F]
 second trimester
 Encounter for antenatal screening for           [4F]
 malformations
 22 weeks gestation of pregnancy
 Short interval between pregancies, 2nd          [4F]
 trimester
 Declined genetic testing
Fetal Evaluation

 Num Of Fetuses:          1
 Fetal Heart Rate(bpm):   158
 Cardiac Activity:        Observed
 Presentation:            Breech
 Placenta:                Posterior Fundal
 P. Cord Insertion:       Previously Visualized

 Amniotic Fluid
 AFI FV:      Within normal limits

                             Largest Pocket(cm)

Biometry
 BPD:      54.3  mm     G. Age:  22w 4d         33  %    CI:        73.84   %    70 - 86
                                                         FL/HC:       18.6  %    19.2 -
 HC:      200.7  mm     G. Age:  22w 2d         14  %    HC/AC:       1.07       1.05 -
 AC:      187.2  mm     G. Age:  23w 3d         63  %    FL/BPD:      68.9  %    71 - 87
 FL:       37.4  mm     G. Age:  21w 6d         13  %    FL/AC:       20.0  %    20 - 24
 LV:          6  mm

 Est. FW:     530   gm     1 lb 3 oz     37  %
OB History

 Gravidity:    2         Term:   1
 Living:       1
Gestational Age

 LMP:           22w 6d        Date:  [DATE]                 EDD:   [DATE]
 U/S Today:     22w 4d                                        EDD:   [DATE]
 Best:          22w 6d     Det. By:  LMP  ([DATE])          EDD:   [DATE]
Anatomy

 Cranium:               Appears normal         LVOT:                   Appears normal
 Cavum:                 Appears normal         Aortic Arch:            Appears normal
 Ventricles:            Appears normal         Ductal Arch:            Appears normal
 Choroid Plexus:        Appears normal         Diaphragm:              Appears normal
 Cerebellum:            Appears normal         Stomach:                Appears normal, left
                                                                       sided
 Posterior Fossa:       Appears normal         Abdomen:                Appears normal
 Nuchal Fold:           Not applicable (>20    Abdominal Wall:         Previously seen
                        wks GA)
 Face:                  Orbits and profile     Cord Vessels:           Previously seen
                        previously seen
 Lips:                  Previously seen        Kidneys:                Appear normal
 Palate:                Appears normal         Bladder:                Appears normal
 Thoracic:              Appears normal         Spine:                  Previously seen
 Heart:                 Appears normal         Upper Extremities:      Previously seen
                        (4CH, axis, and
                        situs)
 RVOT:                  Appears normal         Lower Extremities:      Previously seen

 Other:  Female genitalia previously seen. Nasal bone, lenses Heels/feet and
         open hands previously visualized. VC, 3VV and 3VTV visualized.
Cervix Uterus Adnexa

 Cervix
 Length:           3.63  cm.
 Normal appearance by transabdominal scan.

 Uterus
 No abnormality visualized.

 Right Ovary
 Within normal limits.
 Left Ovary
 Within normal limits.

 Adnexa
 No adnexal mass visualized.
Impression

 Follow up growth due to incomplete anatomy and advanced
 maternal age.
 Normal interval growth with measurements consistent with
 dates
 Good fetal movement and amniotic fluid volume

 Anatomy is completed today.
Recommendations

 Follow up growth scheduled at 32 weeks.

## 2020-12-25 ENCOUNTER — Other Ambulatory Visit: Payer: Self-pay | Admitting: *Deleted

## 2020-12-25 DIAGNOSIS — O09523 Supervision of elderly multigravida, third trimester: Secondary | ICD-10-CM

## 2021-01-08 ENCOUNTER — Other Ambulatory Visit: Payer: Self-pay

## 2021-01-08 ENCOUNTER — Ambulatory Visit (INDEPENDENT_AMBULATORY_CARE_PROVIDER_SITE_OTHER): Payer: Medicaid Other | Admitting: Family Medicine

## 2021-01-08 VITALS — BP 118/62 | HR 88 | Wt 159.2 lb

## 2021-01-08 DIAGNOSIS — Z3490 Encounter for supervision of normal pregnancy, unspecified, unspecified trimester: Secondary | ICD-10-CM

## 2021-01-08 DIAGNOSIS — N949 Unspecified condition associated with female genital organs and menstrual cycle: Secondary | ICD-10-CM

## 2021-01-08 DIAGNOSIS — Z3A25 25 weeks gestation of pregnancy: Secondary | ICD-10-CM

## 2021-01-08 NOTE — Progress Notes (Signed)
   Subjective:  Carol Rangel is a 35 y.o. G2P1001 at [redacted]w[redacted]d being seen today for ongoing prenatal care.  She is currently monitored for the following issues for this low-risk pregnancy and has Sciatica of right side; Language barrier affecting health care; Supervision of other high risk pregnancy, antepartum; History of gestational diabetes in prior pregnancy, currently pregnant; Short interval between pregnancies affecting pregnancy, antepartum; AMA (advanced maternal age) multigravida 35+; and Anemia on their problem list.  Patient reports backache.  Contractions: Not present. Vag. Bleeding: None.  Movement: Present. Denies leaking of fluid.   The following portions of the patient's history were reviewed and updated as appropriate: allergies, current medications, past family history, past medical history, past social history, past surgical history and problem list. Problem list updated.  Objective:   Vitals:   01/08/21 1629  BP: 118/62  Pulse: 88  Weight: 159 lb 3.2 oz (72.2 kg)    Fetal Status: Fetal Heart Rate (bpm): 154 Fundal Height: 25 cm Movement: Present     General:  Alert, oriented and cooperative. Patient is in no acute distress.  Skin: Skin is warm and dry. No rash noted.   Cardiovascular: Normal heart rate noted  Respiratory: Normal respiratory effort, no problems with respiration noted  Abdomen: Soft, gravid, appropriate for gestational age. Pain/Pressure: Present     Pelvic: Vag. Bleeding: None     Cervical exam deferred        Extremities: Normal range of motion.  Edema: None  Mental Status: Normal mood and affect. Normal behavior. Normal judgment and thought content.     Assessment and Plan:  Pregnancy: G2P1001 at [redacted]w[redacted]d  1. Encounter for supervision of low-risk pregnancy, antepartum 2. [redacted] weeks gestation of pregnancy Overall doing well. Having some round ligament pain and lower belly pain. No tightening or contractions. No other concerns at this time.  - Follow up  in 3 weeks for 28 week visit/labs  3. Right sided sciatica Going to PT. Has appt in 3 days.   4. Round ligament pain Patient with some bilateral groin pain and discomfort. Denies vaginal bleeding or leaking of fluid. Pain is not intermittent and does not feel like contractions. Discussed and provided reassurance regarding round ligament discomfort. Recommended tylenol as needed and heat packs.     Preterm labor symptoms and general obstetric precautions including but not limited to vaginal bleeding, contractions, leaking of fluid and fetal movement were reviewed in detail with the patient. Please refer to After Visit Summary for other counseling recommendations.  Return in about 3 weeks (around 01/29/2021) for LROB, needs provider visit (any provider) and 28 week labs.   Warner Mccreedy, MD, MPH OB Fellow, Faculty Practice

## 2021-01-11 ENCOUNTER — Ambulatory Visit: Payer: Medicaid Other | Attending: Obstetrics and Gynecology | Admitting: Physical Therapy

## 2021-01-11 DIAGNOSIS — M6281 Muscle weakness (generalized): Secondary | ICD-10-CM | POA: Insufficient documentation

## 2021-01-11 DIAGNOSIS — R279 Unspecified lack of coordination: Secondary | ICD-10-CM | POA: Insufficient documentation

## 2021-01-23 ENCOUNTER — Other Ambulatory Visit: Payer: Self-pay

## 2021-01-23 ENCOUNTER — Inpatient Hospital Stay (HOSPITAL_COMMUNITY)
Admission: EM | Admit: 2021-01-23 | Discharge: 2021-01-23 | Disposition: A | Discharge status: Home / Self Care | Payer: Medicaid Other | Attending: Obstetrics and Gynecology | Admitting: Obstetrics and Gynecology

## 2021-01-23 ENCOUNTER — Encounter: Payer: Self-pay | Admitting: Physical Therapy

## 2021-01-23 ENCOUNTER — Other Ambulatory Visit: Payer: Self-pay | Admitting: *Deleted

## 2021-01-23 ENCOUNTER — Inpatient Hospital Stay (HOSPITAL_COMMUNITY): Payer: Medicaid Other

## 2021-01-23 ENCOUNTER — Encounter (HOSPITAL_COMMUNITY): Payer: Self-pay | Admitting: Emergency Medicine

## 2021-01-23 DIAGNOSIS — O99891 Other specified diseases and conditions complicating pregnancy: Secondary | ICD-10-CM | POA: Diagnosis not present

## 2021-01-23 DIAGNOSIS — S334XXA Traumatic rupture of symphysis pubis, initial encounter: Secondary | ICD-10-CM

## 2021-01-23 DIAGNOSIS — R339 Retention of urine, unspecified: Secondary | ICD-10-CM | POA: Insufficient documentation

## 2021-01-23 DIAGNOSIS — R109 Unspecified abdominal pain: Secondary | ICD-10-CM

## 2021-01-23 DIAGNOSIS — O09299 Supervision of pregnancy with other poor reproductive or obstetric history, unspecified trimester: Secondary | ICD-10-CM

## 2021-01-23 DIAGNOSIS — O09522 Supervision of elderly multigravida, second trimester: Secondary | ICD-10-CM | POA: Diagnosis not present

## 2021-01-23 DIAGNOSIS — O09899 Supervision of other high risk pregnancies, unspecified trimester: Secondary | ICD-10-CM

## 2021-01-23 DIAGNOSIS — R102 Pelvic and perineal pain: Secondary | ICD-10-CM | POA: Insufficient documentation

## 2021-01-23 DIAGNOSIS — Z3A27 27 weeks gestation of pregnancy: Secondary | ICD-10-CM | POA: Diagnosis not present

## 2021-01-23 DIAGNOSIS — Z8632 Personal history of gestational diabetes: Secondary | ICD-10-CM

## 2021-01-23 LAB — WET PREP, GENITAL
Clue Cells Wet Prep HPF POC: NONE SEEN
Sperm: NONE SEEN
Trich, Wet Prep: NONE SEEN
Yeast Wet Prep HPF POC: NONE SEEN

## 2021-01-23 LAB — URINALYSIS, ROUTINE W REFLEX MICROSCOPIC
Bilirubin Urine: NEGATIVE
Glucose, UA: NEGATIVE mg/dL
Hgb urine dipstick: NEGATIVE
Ketones, ur: NEGATIVE mg/dL
Leukocytes,Ua: NEGATIVE
Nitrite: NEGATIVE
Protein, ur: NEGATIVE mg/dL
Specific Gravity, Urine: 1.01 (ref 1.005–1.030)
pH: 6 (ref 5.0–8.0)

## 2021-01-23 LAB — COMPREHENSIVE METABOLIC PANEL
ALT: 9 U/L (ref 0–44)
AST: 16 U/L (ref 15–41)
Albumin: 3.2 g/dL — ABNORMAL LOW (ref 3.5–5.0)
Alkaline Phosphatase: 51 U/L (ref 38–126)
Anion gap: 8 (ref 5–15)
BUN: 9 mg/dL (ref 6–20)
CO2: 22 mmol/L (ref 22–32)
Calcium: 9.2 mg/dL (ref 8.9–10.3)
Chloride: 104 mmol/L (ref 98–111)
Creatinine, Ser: 0.41 mg/dL — ABNORMAL LOW (ref 0.44–1.00)
GFR, Estimated: >60 mL/min (ref >60)
Glucose, Bld: 88 mg/dL (ref 70–99)
Potassium: 3.8 mmol/L (ref 3.5–5.1)
Sodium: 134 mmol/L — ABNORMAL LOW (ref 135–145)
Total Bilirubin: 0.5 mg/dL (ref 0.3–1.2)
Total Protein: 6.6 g/dL (ref 6.5–8.1)

## 2021-01-23 LAB — CBC WITH DIFFERENTIAL/PLATELET
Abs Immature Granulocytes: 0.1 10*3/uL — ABNORMAL HIGH (ref 0.00–0.07)
Basophils Absolute: 0.1 10*3/uL (ref 0.0–0.1)
Basophils Relative: 1 %
Eosinophils Absolute: 0.3 10*3/uL (ref 0.0–0.5)
Eosinophils Relative: 2 %
HCT: 36 % (ref 36.0–46.0)
Hemoglobin: 11.5 g/dL — ABNORMAL LOW (ref 12.0–15.0)
Immature Granulocytes: 1 %
Lymphocytes Relative: 22 %
Lymphs Abs: 2.3 10*3/uL (ref 0.7–4.0)
MCH: 31 pg (ref 26.0–34.0)
MCHC: 31.9 g/dL (ref 30.0–36.0)
MCV: 97 fL (ref 80.0–100.0)
Monocytes Absolute: 0.7 10*3/uL (ref 0.1–1.0)
Monocytes Relative: 6 %
Neutro Abs: 7.3 10*3/uL (ref 1.7–7.7)
Neutrophils Relative %: 68 %
Platelets: 153 10*3/uL (ref 150–400)
RBC: 3.71 MIL/uL — ABNORMAL LOW (ref 3.87–5.11)
RDW: 13.1 % (ref 11.5–15.5)
WBC: 10.7 10*3/uL — ABNORMAL HIGH (ref 4.0–10.5)
nRBC: 0 % (ref 0.0–0.2)

## 2021-01-23 IMAGING — MR MR PELVIS W/O CM
5 of 6 series · 33 of 48 positions shown · non-contrast
Comparison: None.

CLINICAL DATA: Twenty-six weeks pregnant, pelvic pain

EXAM:
MRI PELVIS WITHOUT CONTRAST
TECHNIQUE: Multiplanar multisequence MR imaging of the pelvis was performed. No
intravenous contrast was administered.

[Series 11: T1 · axial · 4.0mm · 0.74mm/px · z∈[-232,+13]mm · 8 of 50 slices shown (1 of 2)]
[im 1/50]
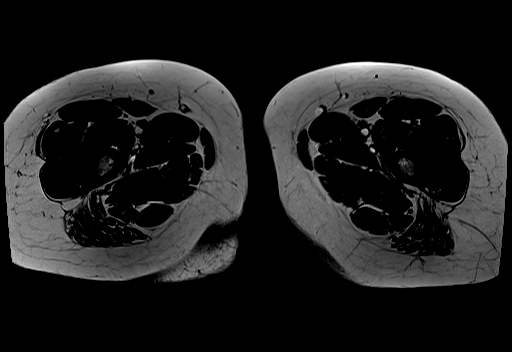
[im 6/50]
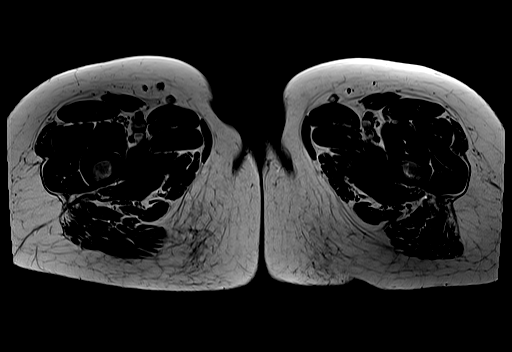
[im 17/50]
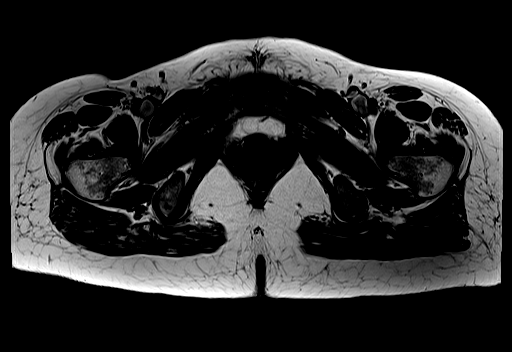
[im 22/50]
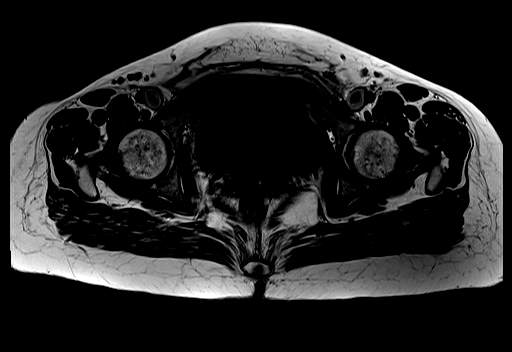
[im 28/50]
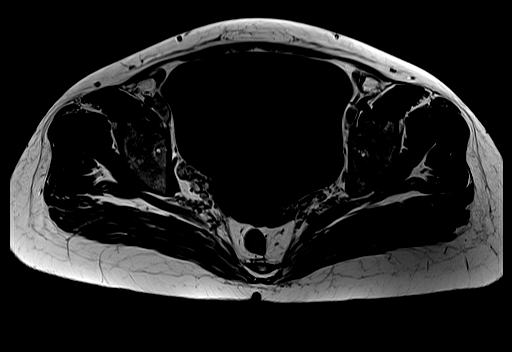
[im 33/50]
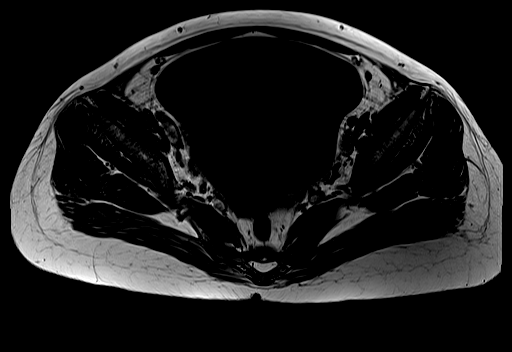
[im 44/50]
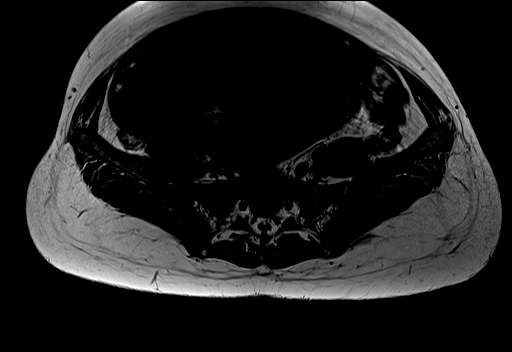
[im 50/50]
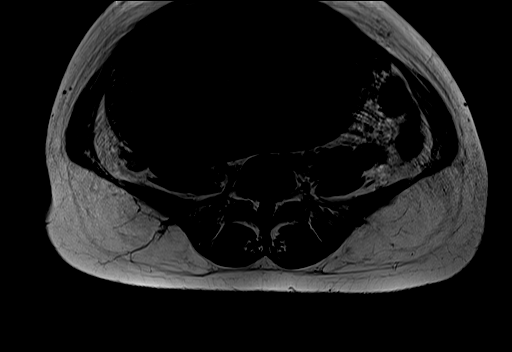

[Series 12: STIR · coronal · 4.0mm · 1.04mm/px · 8 of 40 slices shown]
[im 1/40]
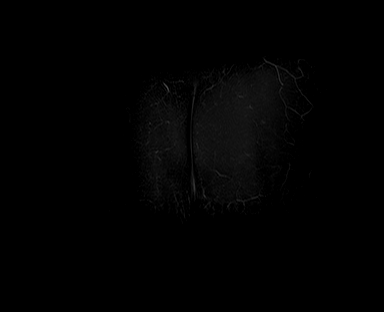
[im 6/40]
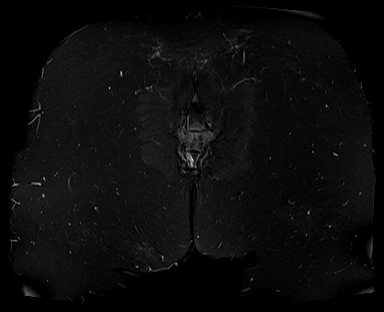
[im 12/40]
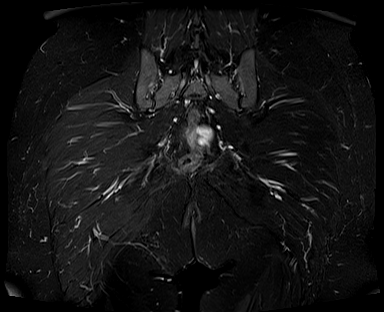
[im 17/40]
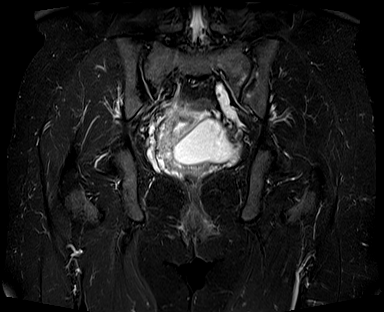
[im 23/40]
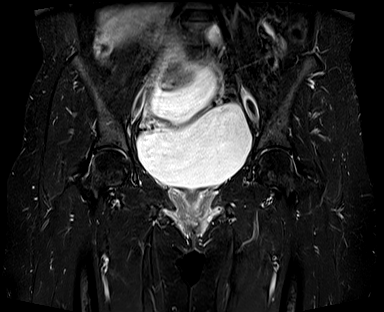
[im 28/40]
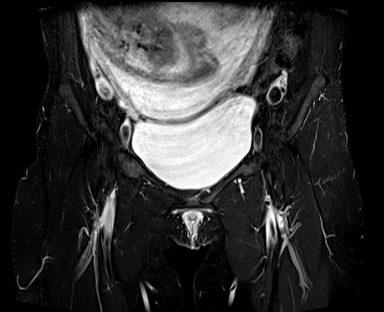
[im 34/40]
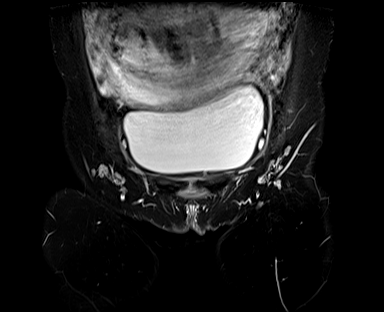
[im 40/40]
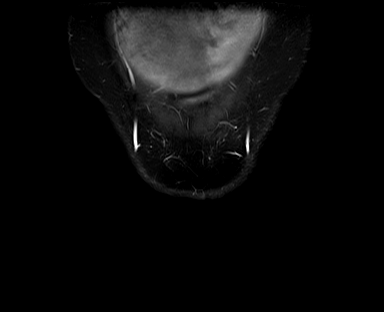

[Series 13: T1 · coronal · 4.0mm · 1.04mm/px · 8 of 40 slices shown (2 of 2)]
[im 1/40]
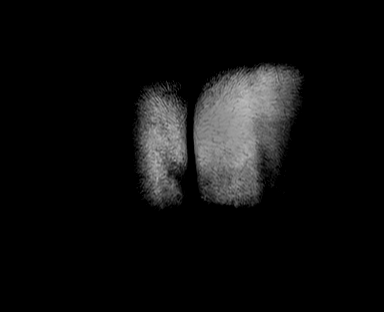
[im 6/40]
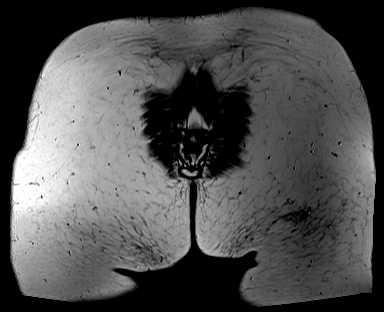
[im 12/40]
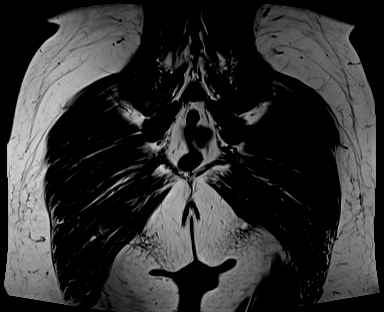
[im 17/40]
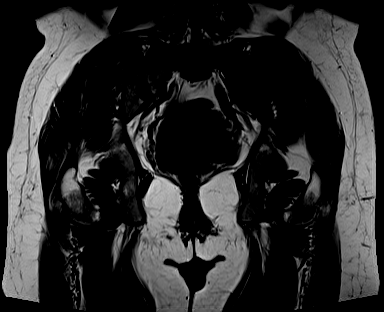
[im 23/40]
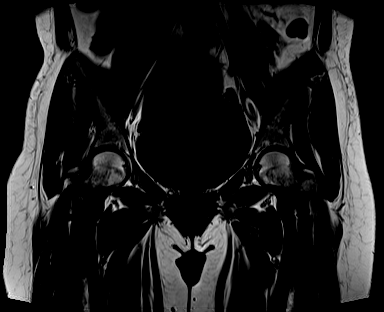
[im 28/40]
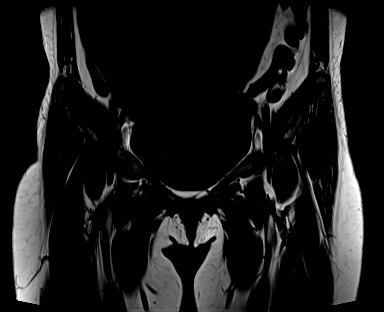
[im 34/40]
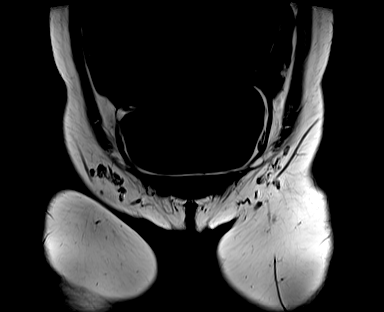
[im 40/40]
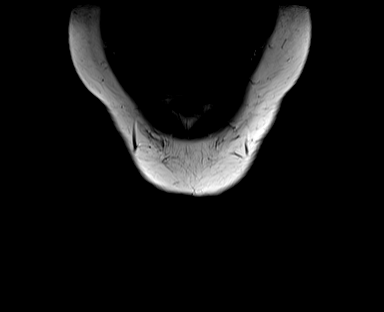

[Series 14: T2 fat-sat · axial · 4.0mm · 0.74mm/px · z∈[-222,+23]mm · 8 of 50 slices shown (1 of 2)]
[im 1/50]
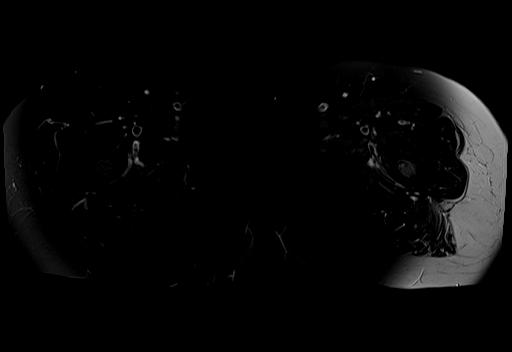
[im 6/50]
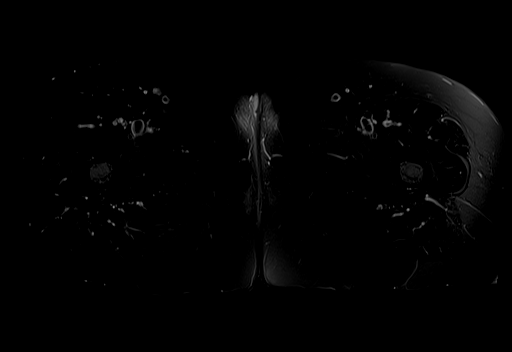
[im 17/50]
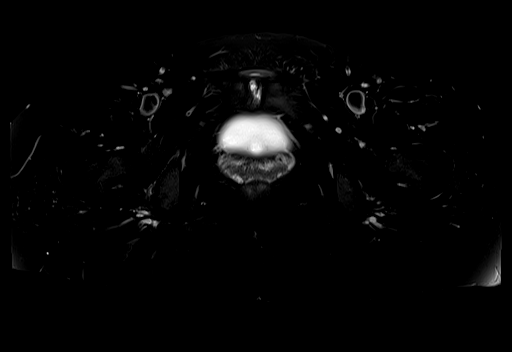
[im 22/50]
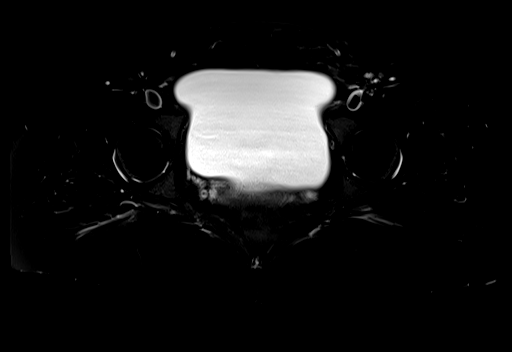
[im 28/50]
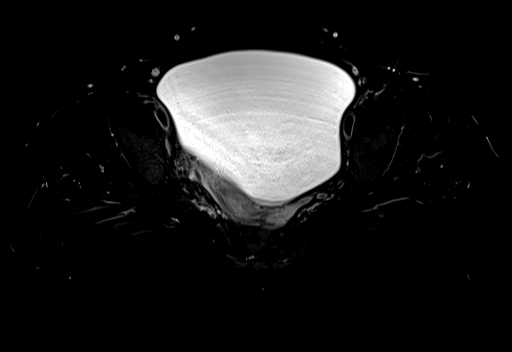
[im 33/50]
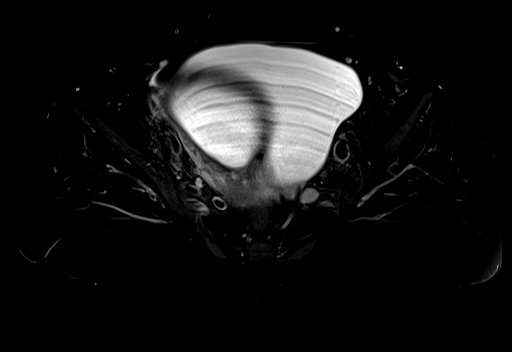
[im 44/50]
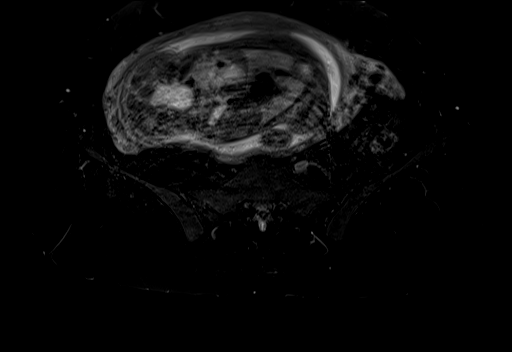
[im 50/50]
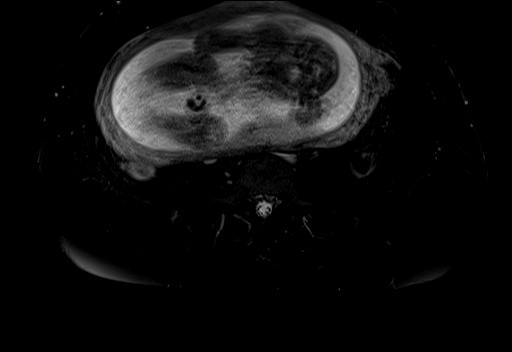

[Series 15: T2 fat-sat · sagittal · 4.0mm · 0.85mm/px · 1 of 33 slices shown (2 of 2)]
[im 1/33]
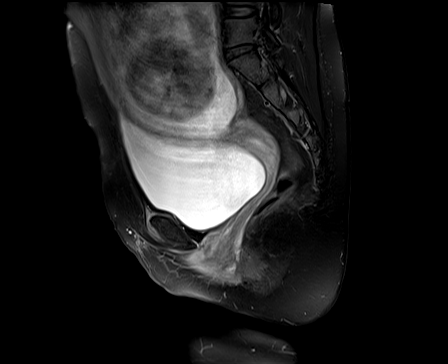

[33 of 48 positions shown; findings below may reference images not displayed]

FINDINGS: Urinary Tract:  Bladder is within normal limits.

Bowel:  Visualized bowel is unremarkable.

Vascular/Lymphatic: No evidence of aneurysm.

No suspicious pelvic lymphadenopathy.

Reproductive: Gravid uterus. Dedicated fetal evaluation not
performed. Transverse lie. Placenta not visualized, presumably
fundal.

Other:  No pelvic ascites.

No evidence of inguinal hernia.

Musculoskeletal: No focal osseous lesions. No evidence of sacral or
pelvic fracture.
IMPRESSION: Gravid uterus, incompletely visualized.

Otherwise negative pelvic MR. No evidence of inguinal hernia. No
evidence of sacral or pelvic fracture.

## 2021-01-23 IMAGING — MR MR LUMBAR SPINE W/O CM
4 of 5 series · 26 of 48 positions shown · non-contrast
Comparison: Prior MRI from [DATE].

CLINICAL DATA: Initial evaluation for acute pelvic pain, pressure,
left groin pain, difficulty walking. Currently pregnant.

EXAM:
MRI LUMBAR SPINE WITHOUT CONTRAST
TECHNIQUE: Multiplanar, multisequence MR imaging of the lumbar spine was
performed. No intravenous contrast was administered.

[Series 5: T2 · sagittal · 4.0mm · 0.73mm/px · 7 of 16 slices shown (1 of 2)]
[im 1/16]
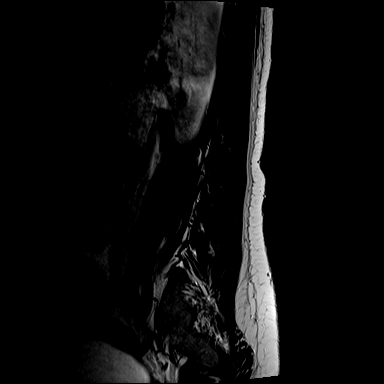
[im 3/16]
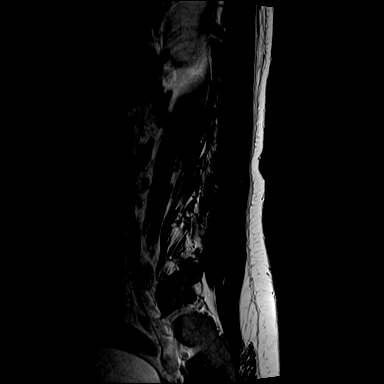
[im 6/16]
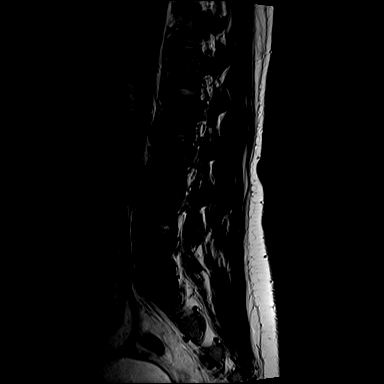
[im 8/16]
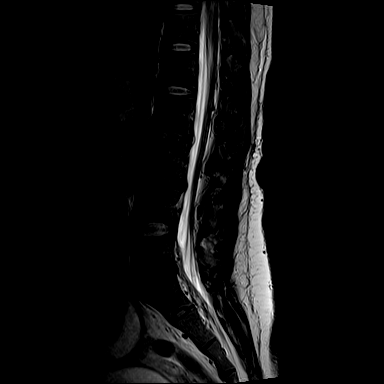
[im 11/16]
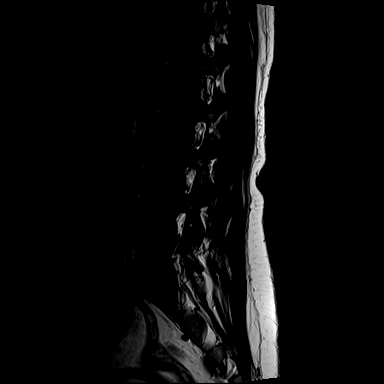
[im 13/16]
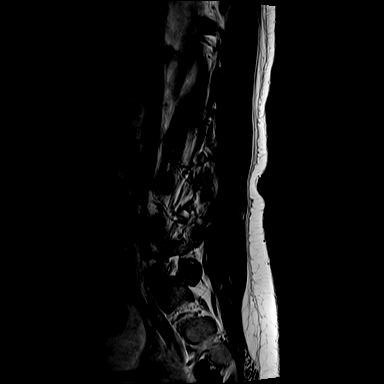
[im 16/16]
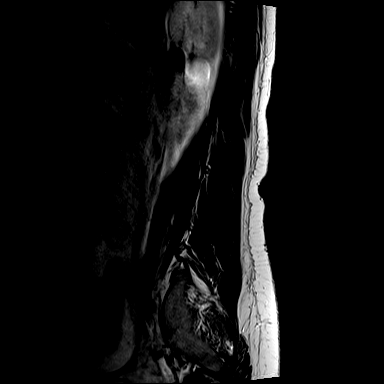

[Series 7: T1 · sagittal · 4.0mm · 0.88mm/px · 6 of 16 slices shown (1 of 2)]
[im 1/16]
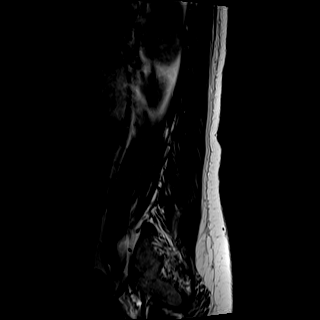
[im 4/16]
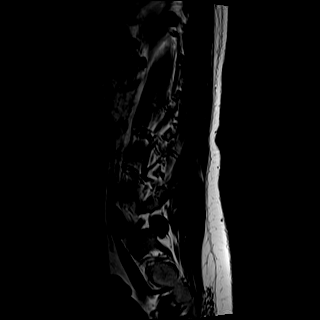
[im 7/16]
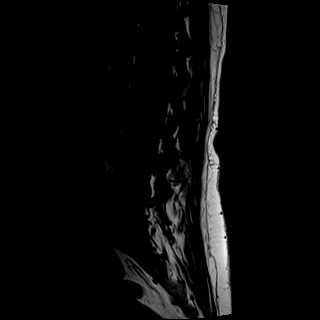
[im 10/16]
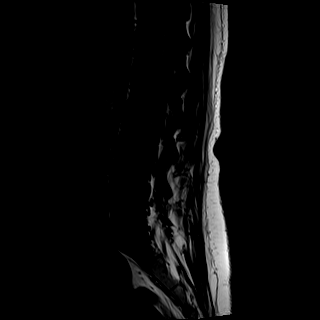
[im 13/16]
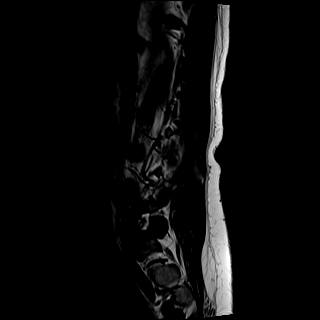
[im 16/16]
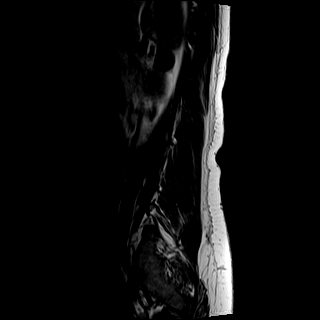

[Series 8: T2 · axial · 4.0mm · 0.57mm/px · z∈[-169,+36]mm · 8 of 36 slices shown (2 of 2)]
[im 1/36]
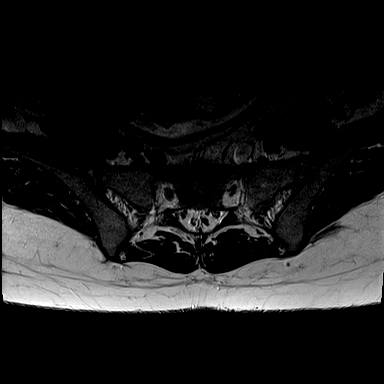
[im 6/36]
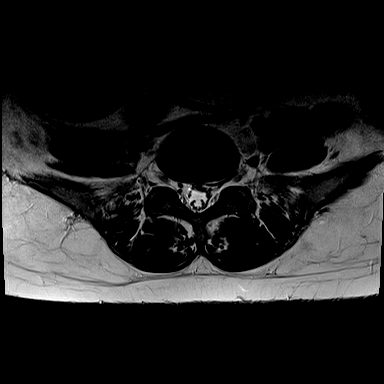
[im 11/36]
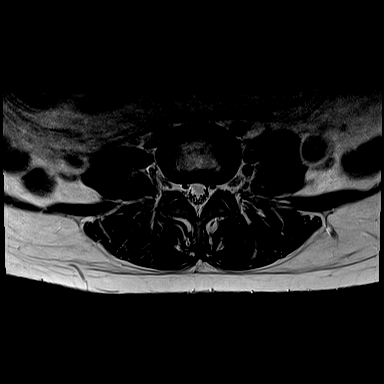
[im 17/36]
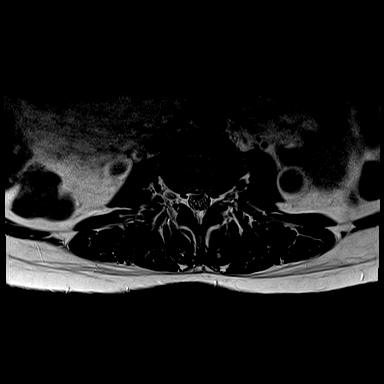
[im 19/36]
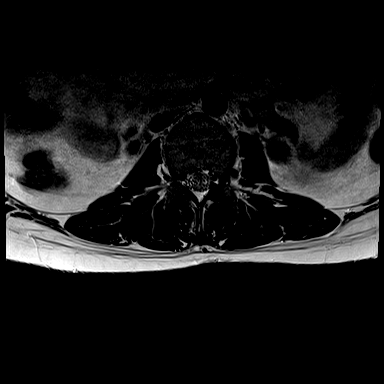
[im 25/36]
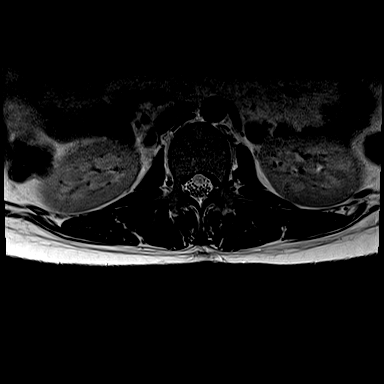
[im 30/36]
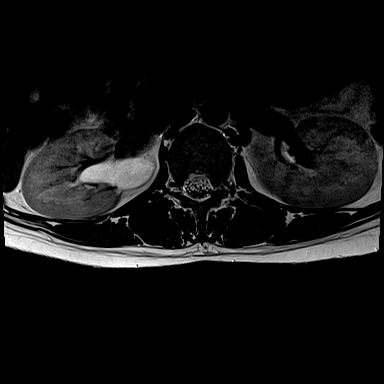
[im 36/36]
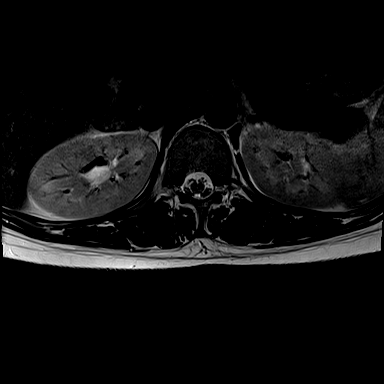

[Series 9: T1 · axial · 4.0mm · 0.34mm/px · z∈[-169,+6]mm · 5 of 36 slices shown (2 of 2)]
[im 1/36]
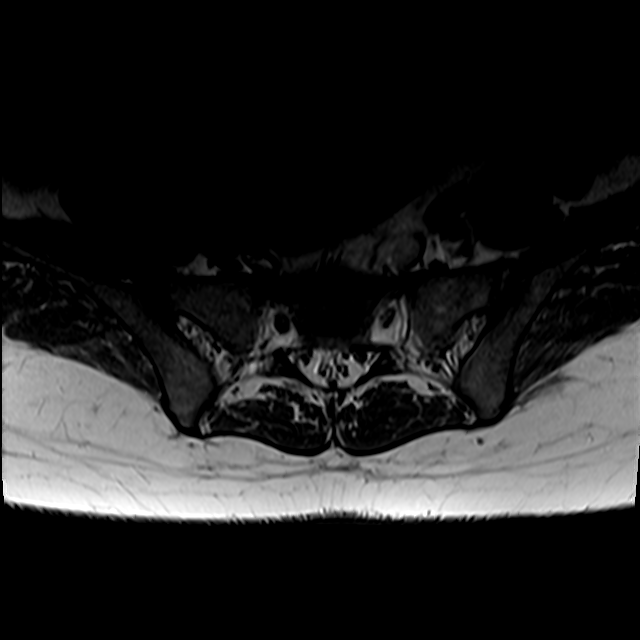
[im 6/36]
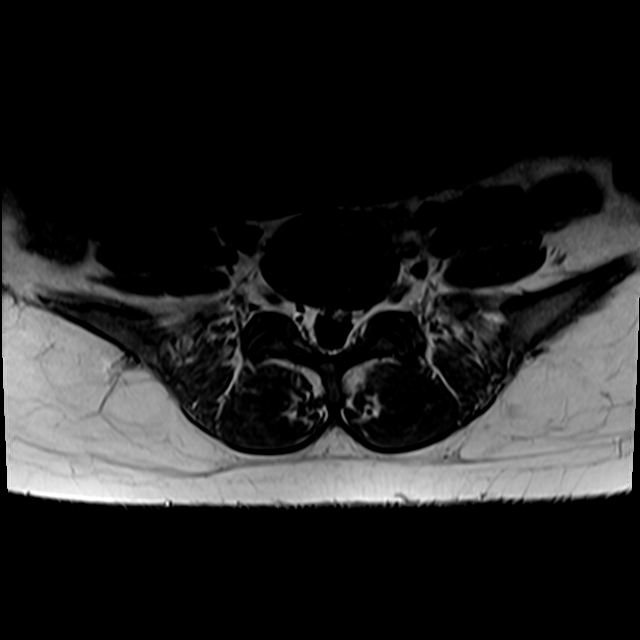
[im 11/36]
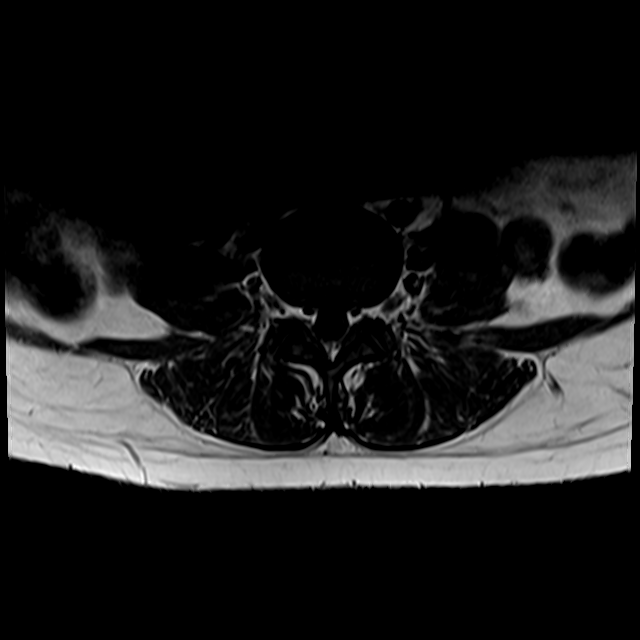
[im 19/36]
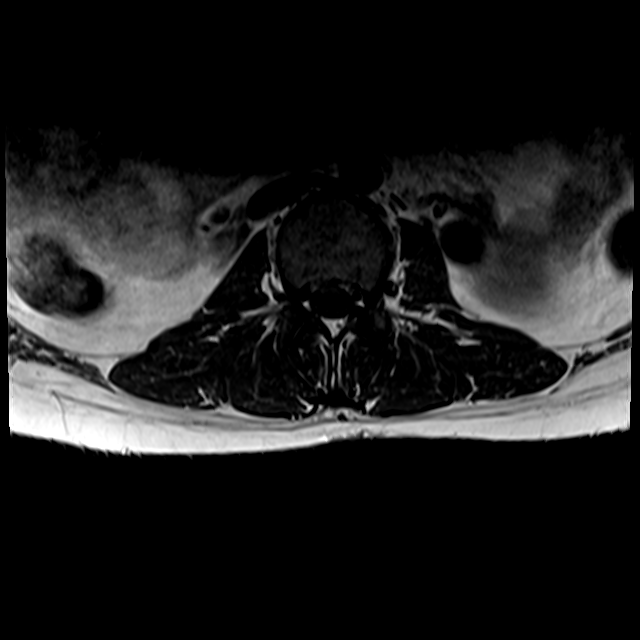
[im 30/36]
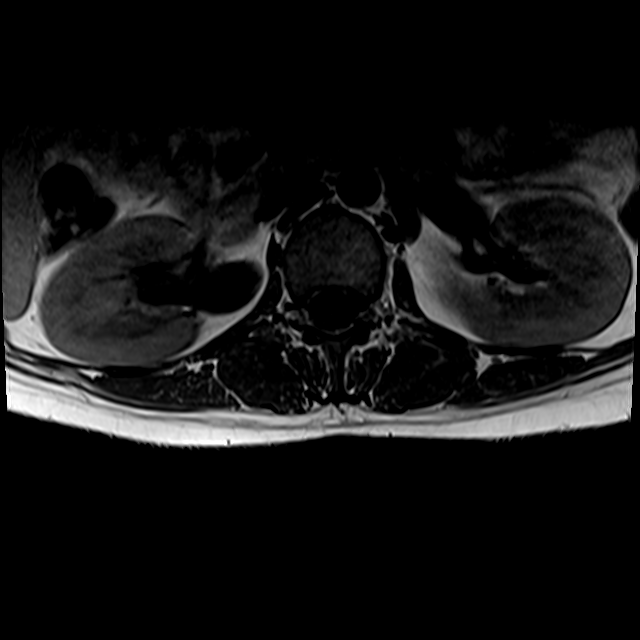

[26 of 48 positions shown; findings below may reference images not displayed]

FINDINGS: Segmentation: Standard. Lowest well-formed disc space labeled the
L5-S1 level.

Alignment: Straightening with mild reversal of the normal lumbar
lordosis, stable. No interval listhesis.

Vertebrae: Vertebral body height maintained without acute or chronic
fracture. Bone marrow signal intensity within normal limits. No
discrete or worrisome osseous lesions. No abnormal marrow edema.

Conus medullaris and cauda equina: Conus extends to the T12-L1
level. Conus and cauda equina appear normal.

Paraspinal and other soft tissues: Paraspinous soft tissues
demonstrate no acute finding. Enlarged gravid uterus partially
visualized. Asymmetric fullness of the right renal collecting system
likely related to pregnancy. Visualized visceral structures
otherwise unremarkable.

Disc levels:

L1-2:  Unremarkable.

L2-3: Mild diffuse disc bulge. Superimposed tiny left subarticular
disc protrusion with annular fissure (series 8, image 16). No
significant spinal stenosis. Foramina remain patent. No impingement.
This is similar to previous.

L3-4: Disc desiccation with mild disc bulge. Superimposed small
central disc protrusion mildly indents the ventral thecal sac
(series 8, image 21). No significant spinal stenosis. Foramina
remain patent. No impingement. This is similar to previous.

L4-5: Normal interspace. Minimal facet hypertrophy. No canal or
foraminal stenosis.

L5-S1: Disc desiccation. Shallow left subarticular to foraminal disc
protrusion with annular fissure (series 8, image 31). No significant
canal or foraminal stenosis. No frank neural impingement. This is
similar to previous.
IMPRESSION: 1. No acute abnormality within the lumbar spine.
2. Shallow left subarticular to foraminal disc protrusion at L5-S1,
closely approximating both the exiting left L5 and descending S1
nerve roots without frank impingement. Finding could contribute to
left-sided symptoms.
3. Additional small disc protrusions at L2-3 and L3-4 as above
without significant stenosis or neural impingement.

## 2021-01-23 MED ORDER — OXYCODONE HCL 5 MG PO TABS
5.0000 mg | ORAL_TABLET | Freq: Once | ORAL | Status: AC
  Administered 2021-01-23: 5 mg via ORAL
  Filled 2021-01-23: qty 1

## 2021-01-23 MED ORDER — CYCLOBENZAPRINE HCL 10 MG PO TABS: 10.0000 mg | ORAL_TABLET | Freq: Two times a day (BID) | ORAL | 0 refills | Status: DC | PRN

## 2021-01-23 NOTE — MAU Note (Signed)
Pt required significant assistance getting dress/undressed.  All movement causes pain.

## 2021-01-23 NOTE — ED Triage Notes (Signed)
Patient c/o pelvic bone pain x 3 days. States difficult to walk due to pain. Reports [redacted] weeks pregnant.

## 2021-01-23 NOTE — MAU Provider Note (Addendum)
History     CSN: [id number]  Arrival date and time: 01/23/21 1258   Event Date/Time   First Provider Initiated Contact with Patient 01/23/21 1507      Chief Complaint  Patient presents with   Pelvic Pain   HPI  Carol Rangel is a 35 y/o female G2P1001 at [redacted]w[redacted]d c/o pelvic pain. Pt reports she has had pelvic pain since onset of pregnancy, but it suddenly got significantly worse 3 days ago. Pt denies any trauma and cannot recall a trigger for worsening pain. Pt states pain is sharp and constant without periods of relief and rates pain as 10/10. States left groin more painful than right and has swelling of pubic symphysis. No radiation beyond pelvic bone, no weakness or loss of motor/sensation of lower extremities. Pt has been incontinent of urine. Pt has not been able to ambulate on her own since last night due to pain. Pt has been using walker to ambulate. She has tried tylenol to no relief. Pt reports similar but less severe pain in her last pregnancy around 36 weeks.   OB History     Gravida  2   Para  1   Term  1   Preterm      AB      Living  1      SAB      IAB      Ectopic      Multiple  0   Live Births  1           Past Medical History:  Diagnosis Date   Gestational diabetes    Medical history non-contributory    Nausea with vomiting 12/20/2013   Sciatica of right side 09/24/2019    Past Surgical History:  Procedure Laterality Date   BREAST SURGERY Right    ?abscess surgery x2    Family History  Problem Relation Age of Onset   Hypertension Mother    Hypertension Father    Cancer Maternal Grandmother     Social History   Tobacco Use   Smoking status: Never   Smokeless tobacco: Never  Vaping Use   Vaping Use: Never used  Substance Use Topics   Alcohol use: No   Drug use: No    Allergies: No Known Allergies  Medications Prior to Admission  Medication Sig Dispense Refill Last Dose   aspirin EC 81 MG tablet Take 1 tablet (81 mg  total) by mouth daily. Take after 12 weeks for prevention of preeclampsia later in pregnancy (Patient not taking: Reported on 01/08/2021) 300 tablet 2    Prenatal Vit-Fe Fumarate-FA (MULTIVITAMIN-PRENATAL) 27-0.8 MG TABS tablet Take 1 tablet by mouth daily at 12 noon.       Review of Systems GI: Denies N/V, diarrhea, constipation.  GU: Affirms urinary incontinence. Musculoskeletal: Affirms pelvic pain. Denies radiation or neuropathy. Physical Exam   Blood pressure 114/68, pulse 85, temperature 98.5 F (36.9 C), temperature source Oral, resp. rate 18, height 5\' 5"  (1.651 m), weight 68.9 kg, last menstrual period 07/14/2020, SpO2 99 %, not currently breastfeeding.  Physical Exam Constitutional:      General: She is not in acute distress.    Appearance: Normal appearance.  HENT:     Head: Normocephalic and atraumatic.  Pulmonary:     Effort: Pulmonary effort is normal. No respiratory distress.  Abdominal:     General: There is no distension.     Palpations: There is no rigidity.     Tenderness: There is no  abdominal tenderness. There is no guarding or rebound.  Genitourinary:    Tenderness: Tenderness to palpation of bilateral groin and pubic symphysis, swelling noted above pubic symphysis.  Musculoskeletal:        Limited range of motion to hips, Pain elicited when I attempted to move legs for bimanual exam.  Skin:    General: Skin is warm and dry.  Neurological:     General: No focal deficit present.     Mental Status: She is alert and oriented to person, place, and time.  Psychiatric:        Mood and Affect: Mood normal.        Behavior: Behavior normal.   NST:  Baseline: 130 Variability: moderate Accels: 15x15 Decels: none Toco: none Reactive/Appropriate for GA   Results for orders placed or performed during the hospital encounter of 01/23/21 (from the past 24 hour(s))  Wet prep, genital     Status: Abnormal   Collection Time: 01/23/21  3:20 PM   Specimen: Vaginal   Result Value Ref Range   Yeast Wet Prep HPF POC NONE SEEN NONE SEEN   Trich, Wet Prep NONE SEEN NONE SEEN   Clue Cells Wet Prep HPF POC NONE SEEN NONE SEEN   WBC, Wet Prep HPF POC MANY (A) NONE SEEN   Sperm NONE SEEN   CBC with Differential/Platelet     Status: Abnormal   Collection Time: 01/23/21  3:54 PM  Result Value Ref Range   WBC 10.7 (H) 4.0 - 10.5 K/uL   RBC 3.71 (L) 3.87 - 5.11 MIL/uL   Hemoglobin 11.5 (L) 12.0 - 15.0 g/dL   HCT 24.2 68.3 - 41.9 %   MCV 97.0 80.0 - 100.0 fL   MCH 31.0 26.0 - 34.0 pg   MCHC 31.9 30.0 - 36.0 g/dL   RDW 62.2 29.7 - 98.9 %   Platelets 153 150 - 400 K/uL   nRBC 0.0 0.0 - 0.2 %   Neutrophils Relative % 68 %   Neutro Abs 7.3 1.7 - 7.7 K/uL   Lymphocytes Relative 22 %   Lymphs Abs 2.3 0.7 - 4.0 K/uL   Monocytes Relative 6 %   Monocytes Absolute 0.7 0.1 - 1.0 K/uL   Eosinophils Relative 2 %   Eosinophils Absolute 0.3 0.0 - 0.5 K/uL   Basophils Relative 1 %   Basophils Absolute 0.1 0.0 - 0.1 K/uL   Immature Granulocytes 1 %   Abs Immature Granulocytes 0.10 (H) 0.00 - 0.07 K/uL  Comprehensive metabolic panel     Status: Abnormal   Collection Time: 01/23/21  3:54 PM  Result Value Ref Range   Sodium 134 (L) 135 - 145 mmol/L   Potassium 3.8 3.5 - 5.1 mmol/L   Chloride 104 98 - 111 mmol/L   CO2 22 22 - 32 mmol/L   Glucose, Bld 88 70 - 99 mg/dL   BUN 9 6 - 20 mg/dL   Creatinine, Ser 2.11 (L) 0.44 - 1.00 mg/dL   Calcium 9.2 8.9 - 94.1 mg/dL   Total Protein 6.6 6.5 - 8.1 g/dL   Albumin 3.2 (L) 3.5 - 5.0 g/dL   AST 16 15 - 41 U/L   ALT 9 0 - 44 U/L   Alkaline Phosphatase 51 38 - 126 U/L   Total Bilirubin 0.5 0.3 - 1.2 mg/dL   GFR, Estimated >74 >08 mL/min   Anion gap 8 5 - 15   MR LUMBAR SPINE WO CONTRAST  Result Date: 01/23/2021 CLINICAL DATA:  Initial evaluation for acute pelvic pain, pressure, left groin pain, difficulty walking. Currently pregnant. EXAM: MRI LUMBAR SPINE WITHOUT CONTRAST TECHNIQUE: Multiplanar, multisequence MR  imaging of the lumbar spine was performed. No intravenous contrast was administered. COMPARISON:  Prior MRI from 01/07/2020. FINDINGS: Segmentation: Standard. Lowest well-formed disc space labeled the L5-S1 level. Alignment: Straightening with mild reversal of the normal lumbar lordosis, stable. No interval listhesis. Vertebrae: Vertebral body height maintained without acute or chronic fracture. Bone marrow signal intensity within normal limits. No discrete or worrisome osseous lesions. No abnormal marrow edema. Conus medullaris and cauda equina: Conus extends to the T12-L1 level. Conus and cauda equina appear normal. Paraspinal and other soft tissues: Paraspinous soft tissues demonstrate no acute finding. Enlarged gravid uterus partially visualized. Asymmetric fullness of the right renal collecting system likely related to pregnancy. Visualized visceral structures otherwise unremarkable. Disc levels: L1-2:  Unremarkable. L2-3: Mild diffuse disc bulge. Superimposed tiny left subarticular disc protrusion with annular fissure (series 8, image 16). No significant spinal stenosis. Foramina remain patent. No impingement. This is similar to previous. L3-4: Disc desiccation with mild disc bulge. Superimposed small central disc protrusion mildly indents the ventral thecal sac (series 8, image 21). No significant spinal stenosis. Foramina remain patent. No impingement. This is similar to previous. L4-5: Normal interspace. Minimal facet hypertrophy. No canal or foraminal stenosis. L5-S1: Disc desiccation. Shallow left subarticular to foraminal disc protrusion with annular fissure (series 8, image 31). No significant canal or foraminal stenosis. No frank neural impingement. This is similar to previous. IMPRESSION: 1. No acute abnormality within the lumbar spine. 2. Shallow left subarticular to foraminal disc protrusion at L5-S1, closely approximating both the exiting left L5 and descending S1 nerve roots without frank  impingement. Finding could contribute to left-sided symptoms. 3. Additional small disc protrusions at L2-3 and L3-4 as above without significant stenosis or neural impingement. Electronically Signed   By: Rise Mu M.D.   On: 01/23/2021 18:57   MR PELVIS WO CONTRAST  Result Date: 01/23/2021 CLINICAL DATA:  Twenty-six weeks pregnant, pelvic pain EXAM: MRI PELVIS WITHOUT CONTRAST TECHNIQUE: Multiplanar multisequence MR imaging of the pelvis was performed. No intravenous contrast was administered. COMPARISON:  None. FINDINGS: Urinary Tract:  Bladder is within normal limits. Bowel:  Visualized bowel is unremarkable. Vascular/Lymphatic: No evidence of aneurysm. No suspicious pelvic lymphadenopathy. Reproductive: Gravid uterus. Dedicated fetal evaluation not performed. Transverse lie. Placenta not visualized, presumably fundal. Other:  No pelvic ascites. No evidence of inguinal hernia. Musculoskeletal: No focal osseous lesions. No evidence of sacral or pelvic fracture. IMPRESSION: Gravid uterus, incompletely visualized. Otherwise negative pelvic MR. No evidence of inguinal hernia. No evidence of sacral or pelvic fracture. Electronically Signed   By: Charline Bills M.D.   On: 01/23/2021 19:16     MAU Course  Procedures  MDM  Patient has had 5mg  oxycodone for pain and ice applied to pubic bone. She reports her pain has improved.   Bladder emptied for 1000cc urine   Assessment and Plan   1. Supervision of other high risk pregnancy, antepartum   2. History of gestational diabetes in prior pregnancy, currently pregnant   3. Short interval between pregnancies affecting pregnancy, antepartum   4. Multigravida of advanced maternal age in second trimester   5. Dislocation of symphysis pubis, initial encounter   6. [redacted] weeks gestation of pregnancy   7. Urinary retention    DC home Comfort measures reviewed  1st/2nd/3rd Trimester precautions  Bleeding precautions Ectopic precautions PTL  precautions  Fetal  kick counts RX: flexeril PRN #20  Return to MAU as needed FU with OB as planned   Follow-up Information     Center for [facility] Follow up.   Specialty: Obstetrics and Gynecology Contact information: [address] [place] [address] [phone]               Thressa Sheller DNP, CNM  01/23/21  7:48 PM    Rayvon Char 01/23/2021, 3:35 PM

## 2021-01-23 NOTE — ED Provider Notes (Signed)
Emergency Medicine Provider Triage Evaluation Note  Carol Rangel , a 35 y.o. female  was evaluated in triage.  Pt complains of pelvic pain and pressure.  She states that she has had 3 days of pelvic bone pain and difficulty walking.  She points to her left groin saying that she is having pain in this region.  She is [redacted] weeks pregnant.  She denies any abdominal cramping, vaginal bleeding or gush of fluids.  Review of Systems  Positive: Pelvic pain Negative: Fever, vaginal bleeding  Physical Exam  BP 122/68 (BP Location: Right Arm)   Pulse 94   Temp 98.5 F (36.9 C) (Oral)   Resp 18   Ht 5\' 5"  (1.651 m)   Wt 68.9 kg   LMP 07/14/2020   SpO2 98%   BMI 25.29 kg/m  Gen:   Awake, no distress   Resp:  Normal effort  MSK:   Moves extremities without difficulty  Other:  Gravid abdomen that is soft and nontender.  Medical Decision Making  Medically screening exam initiated at 1:56 PM.  Appropriate orders placed.  Carol Rangel was informed that the remainder of the evaluation will be completed by another provider, this initial triage assessment does not replace that evaluation, and the importance of remaining in the ED until their evaluation is complete.  Spoke with [name], APP with MAU who agrees to accept the patient at this time.  She will be transported up to the MAU for further work-up.   Herbert Seta, PA-C 01/23/21 1358    13/08/22, MD 01/24/21 [phone]

## 2021-01-23 NOTE — MAU Note (Signed)
Pelvic pain, started 3 days ago. Worse with movement, if sitting, can not move legs with out pain. Had this with first preg, this is much worse though.  Denies bleeding or leaking. Reports +FM

## 2021-01-24 LAB — GC/CHLAMYDIA PROBE AMP (~~LOC~~) NOT AT ARMC
Chlamydia: NEGATIVE
Comment: NEGATIVE
Comment: NORMAL
Neisseria Gonorrhea: NEGATIVE

## 2021-01-30 NOTE — Progress Notes (Signed)
   PRENATAL VISIT NOTE  Subjective:  Carol Rangel is a 35 y.o. G2P1001 at [redacted]w[redacted]d being seen today for ongoing prenatal care.  She is currently monitored for the following issues for this low-risk pregnancy and has Sciatica of right side; Language barrier affecting health care; Supervision of other high risk pregnancy, antepartum; History of gestational diabetes in prior pregnancy, currently pregnant; Short interval between pregnancies affecting pregnancy, antepartum; AMA (advanced maternal age) multigravida 35+; and Anemia on their problem list.  Patient reports  pubic symphysis pain and pressure - she has had this pain for at least 2 weeks.  She had a PT referral and appt but cancelled it because she didn't think it would help.  Contractions: Not present.  .  Movement: Present. Denies leaking of fluid. Denies vaginal bleeding.   The following portions of the patient's history were reviewed and updated as appropriate: allergies, current medications, past family history, past medical history, past social history, past surgical history and problem list.   Objective:   Vitals:   01/31/21 0850  BP: 122/81  Pulse: 87    Fetal Status: Fetal Heart Rate (bpm): 152 Fundal Height: 28 cm Movement: Present     General:  Alert, oriented and cooperative. Patient is in no acute distress.  Skin: Skin is warm and dry. No rash noted.   Cardiovascular: Normal heart rate noted  Respiratory: Normal respiratory effort, no problems with respiration noted  Abdomen: Soft, gravid, appropriate for gestational age.  Pain/Pressure: Present     Pelvic: Cervical exam deferred        Extremities: Normal range of motion.  Edema: None  Mental Status: Normal mood and affect. Normal behavior. Normal judgment and thought content.   Assessment and Plan:  Pregnancy: G2P1001 at [redacted]w[redacted]d 1. Language barrier affecting health care - She reports she speaks English  2. Supervision of other high risk pregnancy, antepartum -  Recommended flu shot - pt will consider - Recommended tdap - received it today.  - Discuss contraception NV   3. History of gestational diabetes in prior pregnancy, currently pregnant - 28w labs due today. Early A1C was 4.9  4. Short interval between pregnancies affecting pregnancy, antepartum - Counseled on risk of PTB and SGA - 10/6 US showed normal growth -- next scan is 12/20 at 32 wks.    5. Multigravida of advanced maternal age in third trimester - Declined genetics, normal anatomy US  6. Anemia, unspecified type - Not currently anemic. CBC on 11/8 was 11.5.   7. Pubic bone pain - Discussed PT and continuing maternity belt. Discussed PT will help some improve the pain she has now but importantly help prevent it from worsening as the pregnancy goes on.  - Reviewed PTL precautions.  - Discussed delivery will help the pain. She also has some SUI - discussed important to do PT after.   Preterm labor symptoms and general obstetric precautions including but not limited to vaginal bleeding, contractions, leaking of fluid and fetal movement were reviewed in detail with the patient. Please refer to After Visit Summary for other counseling recommendations.   Return in about 2 weeks (around 02/14/2021) for OB VISIT, MD or APP.  Future Appointments  Date Time Provider Department Center  01/31/2021  9:30 AM [facility] LAB [facility]  03/06/2021  3:30 PM [facility] NURSE [facility]  03/06/2021  3:45 PM [facility] US1 [facility]    Milas Hock, MD

## 2021-01-31 ENCOUNTER — Other Ambulatory Visit: Payer: Medicaid Other

## 2021-01-31 ENCOUNTER — Ambulatory Visit (INDEPENDENT_AMBULATORY_CARE_PROVIDER_SITE_OTHER): Payer: Medicaid Other | Admitting: Obstetrics and Gynecology

## 2021-01-31 ENCOUNTER — Other Ambulatory Visit: Payer: Self-pay

## 2021-01-31 VITALS — BP 122/81 | HR 87

## 2021-01-31 DIAGNOSIS — Z23 Encounter for immunization: Secondary | ICD-10-CM

## 2021-01-31 DIAGNOSIS — Z3A28 28 weeks gestation of pregnancy: Secondary | ICD-10-CM

## 2021-01-31 DIAGNOSIS — Z603 Acculturation difficulty: Secondary | ICD-10-CM

## 2021-01-31 DIAGNOSIS — D649 Anemia, unspecified: Secondary | ICD-10-CM

## 2021-01-31 DIAGNOSIS — O09299 Supervision of pregnancy with other poor reproductive or obstetric history, unspecified trimester: Secondary | ICD-10-CM

## 2021-01-31 DIAGNOSIS — O09899 Supervision of other high risk pregnancies, unspecified trimester: Secondary | ICD-10-CM

## 2021-01-31 DIAGNOSIS — Z8632 Personal history of gestational diabetes: Secondary | ICD-10-CM

## 2021-01-31 DIAGNOSIS — M899 Disorder of bone, unspecified: Secondary | ICD-10-CM

## 2021-01-31 DIAGNOSIS — O09523 Supervision of elderly multigravida, third trimester: Secondary | ICD-10-CM

## 2021-01-31 DIAGNOSIS — Z789 Other specified health status: Secondary | ICD-10-CM

## 2021-01-31 NOTE — Patient Instructions (Signed)
Your pain is caused by a thing called pubic symphysis separation. We will refer you to PT for this. Please continue the maternity belt in the meantime.

## 2021-01-31 NOTE — Progress Notes (Signed)
Patient reports "a lot" of pelvic that started 2 weeks ago along pelvic pressure. Patient stated that the pain is preventing her from walking, sleeping and other day today activities Ms. Sedita denies any vaginal bleeding and stated that baby has been moving well.  Dawayne Patricia, CMA   01/31/21

## 2021-02-01 ENCOUNTER — Encounter: Payer: Self-pay | Admitting: Physical Therapy

## 2021-02-01 ENCOUNTER — Other Ambulatory Visit: Payer: Self-pay

## 2021-02-01 ENCOUNTER — Encounter: Payer: Self-pay | Admitting: Obstetrics and Gynecology

## 2021-02-01 DIAGNOSIS — O24419 Gestational diabetes mellitus in pregnancy, unspecified control: Secondary | ICD-10-CM

## 2021-02-01 LAB — RPR: RPR Ser Ql: NONREACTIVE

## 2021-02-01 LAB — GLUCOSE TOLERANCE, 2 HOURS W/ 1HR
Glucose, 1 hour: 171 mg/dL (ref 70–179)
Glucose, 2 hour: 162 mg/dL — ABNORMAL HIGH (ref 70–152)
Glucose, Fasting: 80 mg/dL (ref 70–91)

## 2021-02-01 LAB — CBC
Hematocrit: 35.5 % (ref 34.0–46.6)
Hemoglobin: 11.8 g/dL (ref 11.1–15.9)
MCH: 30.6 pg (ref 26.6–33.0)
MCHC: 33.2 g/dL (ref 31.5–35.7)
MCV: 92 fL (ref 79–97)
Platelets: 157 10*3/uL (ref 150–450)
RBC: 3.86 x10E6/uL (ref 3.77–5.28)
RDW: 12.4 % (ref 11.7–15.4)
WBC: 10.1 10*3/uL (ref 3.4–10.8)

## 2021-02-01 LAB — HIV ANTIBODY (ROUTINE TESTING W REFLEX): HIV Screen 4th Generation wRfx: NONREACTIVE

## 2021-02-12 ENCOUNTER — Encounter: Payer: Self-pay | Admitting: Physical Therapy

## 2021-02-19 ENCOUNTER — Encounter: Payer: Self-pay | Admitting: Physical Therapy

## 2021-02-20 ENCOUNTER — Encounter: Payer: Medicaid Other | Attending: Obstetrics and Gynecology | Admitting: Registered"

## 2021-02-20 ENCOUNTER — Other Ambulatory Visit: Payer: Self-pay

## 2021-02-20 ENCOUNTER — Ambulatory Visit (INDEPENDENT_AMBULATORY_CARE_PROVIDER_SITE_OTHER): Payer: Medicaid Other | Admitting: Advanced Practice Midwife

## 2021-02-20 ENCOUNTER — Ambulatory Visit: Payer: Medicaid Other | Admitting: Registered"

## 2021-02-20 ENCOUNTER — Encounter: Payer: Self-pay | Admitting: Advanced Practice Midwife

## 2021-02-20 VITALS — BP 101/70 | HR 112 | Wt 161.0 lb

## 2021-02-20 DIAGNOSIS — O24419 Gestational diabetes mellitus in pregnancy, unspecified control: Secondary | ICD-10-CM | POA: Insufficient documentation

## 2021-02-20 DIAGNOSIS — O24410 Gestational diabetes mellitus in pregnancy, diet controlled: Secondary | ICD-10-CM

## 2021-02-20 DIAGNOSIS — Z3A00 Weeks of gestation of pregnancy not specified: Secondary | ICD-10-CM | POA: Diagnosis not present

## 2021-02-20 DIAGNOSIS — Z3A31 31 weeks gestation of pregnancy: Secondary | ICD-10-CM

## 2021-02-20 DIAGNOSIS — O0990 Supervision of high risk pregnancy, unspecified, unspecified trimester: Secondary | ICD-10-CM

## 2021-02-20 MED ORDER — ACCU-CHEK GUIDE VI STRP: ORAL_STRIP | 3 refills | Status: DC

## 2021-02-20 MED ORDER — OXYCODONE-ACETAMINOPHEN 5-325 MG PO TABS: 1.0000 | ORAL_TABLET | Freq: Four times a day (QID) | ORAL | 0 refills | Status: DC | PRN

## 2021-02-20 MED ORDER — ACCU-CHEK SOFTCLIX LANCETS MISC: 12 refills | Status: DC

## 2021-02-20 NOTE — Progress Notes (Signed)
Patient was seen for Gestational Diabetes self-management on 02/20/21  Start time 0820 and End time 0920   Estimated due date: 04/20/21; [redacted]w[redacted]d  Clinical: Medications: reviewed Medical History: diet controlled GDM in previous pregnancy Labs: OGTT 80-171-162(H), A1c 4.9%   Dietary and Lifestyle History: Pt states she is tired all the time, weak and feels heavy. Pt states she drinks Ensure high protein to give her energy.  Physical Activity: limited due to pelvic injury, uses walker Stress: not assessed Sleep: 6-7 hrs, + sometimes nap  24 hr Recall:  First Meal: bread, cereal or asian noodles with egg Snack: fruit Second meal: rice, meat, vegetables Snack: Third meal: rice, meat, vegetables Snack: Beverages: water, a little coffee, apple juice  NUTRITION INTERVENTION  Nutrition education (E-1) on the following topics:   Initial Follow-up  [x]  []  Definition of Gestational Diabetes [x]  []  Why dietary management is important in controlling blood glucose [x]  []  Effects each nutrient has on blood glucose levels []  []  Simple carbohydrates vs complex carbohydrates [x]  []  Fluid intake [x]  []  Creating a balanced meal plan [x]  []  Carbohydrate counting  [x]  []  When to check blood glucose levels [x]  []  Proper blood glucose monitoring techniques [x]  []  Effect of stress and stress reduction techniques  [x]  []  Exercise effect on blood glucose levels, appropriate exercise during pregnancy [x]  []  Importance of limiting caffeine and abstaining from alcohol and smoking [x]  []  Medications used for blood sugar control during pregnancy [x]  []  Hypoglycemia and rule of 15 [x]  []  Postpartum self care  Blood glucose monitor given: Accu-chek Guide Me Lot Exp: 04/27/2022 CBG: 153 mg/dL   Patient instructed to monitor glucose levels: FBS: 60 - ? 95 mg/dL (some clinics use 90 for cutoff) 1 hour: ? 140 mg/dL 2 hour: ? mg/dL  Patient received handouts: Nutrition Diabetes and  Pregnancy Carbohydrate Counting List  Patient will be seen for follow-up as needed.

## 2021-02-20 NOTE — Patient Instructions (Signed)
Instead of Ensure high protein, Try Ensure Max Protein for lower carb  Less mango and rice, more protein and non-starchy vegetables. Eat foods with iron, calcium and vitamin C daily.  Next physical therapy visit ask for ideas to get safe exercise. The general recommendation is 30-45 minutes per day, 3-5 times per week.  Remember to bring glucose sheet to all your appointments.  Drink water more in the morning, less in the later afternoon. Instead of juice try eating whole fruit.

## 2021-02-20 NOTE — Progress Notes (Signed)
   PRENATAL VISIT NOTE  Subjective:  Carol Rangel is a 35 y.o. G2P1001 at [redacted]w[redacted]d being seen today for ongoing prenatal care.  She is currently monitored for the following issues for this high-risk pregnancy and has Sciatica of right side; Language barrier affecting health care; Supervision of other high risk pregnancy, antepartum; Gestational diabetes; Short interval between pregnancies affecting pregnancy, antepartum; AMA (advanced maternal age) multigravida 35+; and Anemia on their problem list.  Patient reports  still having pelvic bone pain. Nothing really helps. Also hot flashes .  Contractions: Not present. Vag. Bleeding: None.  Movement: Present. Denies leaking of fluid.   The following portions of the patient's history were reviewed and updated as appropriate: allergies, current medications, past family history, past medical history, past social history, past surgical history and problem list.   Objective:   Vitals:   02/20/21 0943  BP: 101/70  Pulse: (!) 112  Weight: 73 kg    Fetal Status: Fetal Heart Rate (bpm): 143 Fundal Height: 32 cm Movement: Present     General:  Alert, oriented and cooperative. Patient is in no acute distress.  Skin: Skin is warm and dry. No rash noted.   Cardiovascular: Normal heart rate noted  Respiratory: Normal respiratory effort, no problems with respiration noted  Abdomen: Soft, gravid, appropriate for gestational age.  Pain/Pressure: Present     Pelvic: Cervical exam deferred        Extremities: Normal range of motion.  Edema: Trace  Mental Status: Normal mood and affect. Normal behavior. Normal judgment and thought content.   Assessment and Plan:  Pregnancy: G2P1001 at [redacted]w[redacted]d 1. Supervision of high risk pregnancy, antepartum - GDM educator today - Will start testing glucose levels - Has appt to start PT for pelvic pain. MRI was mostly normal on 01/23/2021 - RX sent for oxycodone   2. [redacted] weeks gestation of pregnancy - routine care  Preterm  labor symptoms and general obstetric precautions including but not limited to vaginal bleeding, contractions, leaking of fluid and fetal movement were reviewed in detail with the patient. Please refer to After Visit Summary for other counseling recommendations.   Return in about 2 weeks (around 03/06/2021).  Future Appointments  Date Time Provider Department Center  02/20/2021  2:55 PM Armando Reichert, CNM [facility]  02/28/2021  2:45 PM Barbaraann Faster, PT [facility] None  03/06/2021  3:30 PM [facility] NURSE [facility]  03/06/2021  3:45 PM [facility] US1 [facility]   Thressa Sheller DNP, CNM  02/20/21  10:35 AM

## 2021-02-20 NOTE — Telephone Encounter (Signed)
Accucheck lancets, glucose strips refilled.   Judeth Cornfield, RN

## 2021-02-26 ENCOUNTER — Encounter: Payer: Self-pay | Admitting: Physical Therapy

## 2021-02-28 ENCOUNTER — Other Ambulatory Visit: Payer: Self-pay

## 2021-02-28 ENCOUNTER — Ambulatory Visit: Payer: Medicaid Other | Attending: Obstetrics and Gynecology | Admitting: Physical Therapy

## 2021-02-28 DIAGNOSIS — R279 Unspecified lack of coordination: Secondary | ICD-10-CM | POA: Diagnosis present

## 2021-02-28 DIAGNOSIS — R293 Abnormal posture: Secondary | ICD-10-CM | POA: Diagnosis present

## 2021-02-28 DIAGNOSIS — R252 Cramp and spasm: Secondary | ICD-10-CM | POA: Diagnosis present

## 2021-02-28 DIAGNOSIS — M899 Disorder of bone, unspecified: Secondary | ICD-10-CM | POA: Insufficient documentation

## 2021-02-28 DIAGNOSIS — R269 Unspecified abnormalities of gait and mobility: Secondary | ICD-10-CM | POA: Diagnosis present

## 2021-02-28 DIAGNOSIS — M6281 Muscle weakness (generalized): Secondary | ICD-10-CM | POA: Insufficient documentation

## 2021-02-28 NOTE — Therapy (Signed)
[facility] @ [facility] [address] [place], [place], [address] Phone: [phone]   Fax:  [phone]  Physical Therapy Evaluation  Patient Details  Name: Carol Rangel MRN: [id number] Date of Birth: Jun 15, 1985 Referring Provider (PT): Catalina Antigua, MD   Encounter Date: 02/28/2021   PT End of Session - 02/28/21 1530     Visit Number 1    Date for PT Re-Evaluation 05/01/21    Authorization Type UHC medicaid    PT Start Time 1445    PT Stop Time 1525    PT Time Calculation (min) 40 min    Equipment Utilized During Treatment Other (comment)   pt's personal FWW   Activity Tolerance Patient tolerated treatment well;Patient limited by pain    Behavior During Therapy [facility] for tasks assessed/performed             Past Medical History:  Diagnosis Date   Gestational diabetes    Medical history non-contributory    Nausea with vomiting 12/20/2013   Sciatica of right side 09/24/2019    Past Surgical History:  Procedure Laterality Date   BREAST SURGERY Right    ?abscess surgery x2    There were no vitals filed for this visit.    Subjective Assessment - 02/28/21 1451     Subjective Pt reports she has been having pubic bone pain since 2 months pregnant, did come to PT prior but needed to DC not returning.    Pertinent History currently 20 months, 50 month old    How long can you stand comfortably? standing longer than 5 mins without walker    How long can you walk comfortably? walking longer than 5 mins without walker    Diagnostic tests (-) MRI    Currently in Pain? Yes    Pain Score --   worse with sleeping on sides, steps, putting on pants, and transferring in/out of car               [facility] PT Assessment - 02/28/21 0001       ROM / Strength   AROM / PROM / Strength AROM;Strength      AROM   Overall AROM Comments thoracic and lumbar all direction s limited by 75% due to pain      Strength   Overall Strength  Comments bil hips grossly 3/5 limited by pain      Flexibility   Soft Tissue Assessment /Muscle Length yes   hamstrings and adductors limited bul by 25%     Palpation   Palpation comment TTP at pubic bone and bil groin                        Objective measurements completed on examination: See above findings.     Pelvic Floor Special Questions - 02/28/21 0001     Are you Pregnant or attempting pregnancy? Yes    Prior Pregnancies Yes    Number of Pregnancies 1    Number of Vaginal Deliveries 1    Urinary Leakage Yes    How often not often only with belly band tightly    Urinary urgency Yes    Urinary frequency sometimes due to baby's position    Falling out feeling (prolapse) No    Pelvic Floor Internal Exam deferred                         PT Short Term Goals -  02/28/21 1607       PT SHORT TERM GOAL #1   Title Pt to be I with HEP    Time 5    Period Weeks    Status New    Target Date 04/04/21      PT SHORT TERM GOAL #2   Title pt to report more than 6/10 pain at pubic bone with mobility and ability to stand for 20 mins for improved functional mobility.    Time 5    Period Weeks    Status New    Target Date 04/04/21               PT Long Term Goals - 02/28/21 1608       PT LONG TERM GOAL #1   Title pt to be I with advanced HEP    Time 2    Period Months    Status New    Target Date 05/01/21      PT LONG TERM GOAL #2   Title pt to report no more than 4/10 pain at pubic bone for improved tolerance to standing for at least 30 mins.    Time 2    Period Months    Status New    Target Date 05/01/21      PT LONG TERM GOAL #3   Title pt to demonstrate at least 5/5 bil hip strength for birth prep.    Time 2    Period Months    Status New    Target Date 05/01/21                    Plan - 02/28/21 1531     Clinical Impression Statement Pt is 35yo female currently [redacted] weeks pregnant with second child, first  being 27 months old. Pt previously evaluated by this PT earlier in pregnancy for same pubic bone pain at all times and requested to be DC'd due to inability to mobilize well and MD requested to wait until after baby was born. Pt now returning with same pain but has been getting worse as pregnency progresses and now pt is using a walker. Pt reported without walker she is unable to stand or walk longer than 5 mins. Pt has pain with all single leg activities with much worse pain but some sort of pain almost all the time unless resting in sitting for shorts periods of time then pt needs to change positions. Pt reports her pain is at pubic bone/symphasis and bil groin pain with palpation found. Pt also had decreased spine and hip mobility, decreased hip strength, increased urinary urgency and frequency intermittently and will have loss of bladder with belly band use. Pt reports she has attempted band use but doesn't help that much and she has leakage. Pt reported tape last session of PT was really helpful for her and post exam this was completed with K-tape. Pt and husband present and pt given education and handout on how to complete at home as neededd. Pt also educated on how to remove and when to remove tape for home. Pt denied allergies and understood to remove with any skin irritation. Pt reported immediate improvement in pain levels with taping, pt required min A for transfer into sitting from supine. Pt able to stand without walker with less pain than prior to PT she reported. Pt would benefit from additional PT to address pain and improve QOL.    Personal Factors and Comorbidities Time since onset of injury/illness/exacerbation;Comorbidity  1    Comorbidities pregnant    Examination-Activity Limitations Continence;Lift;Stand;Stairs;Squat;Sleep;Sit;Locomotion Level;Transfers    Examination-Participation Restrictions Cleaning;Community Activity;Interpersonal Relationship;Laundry;Yard Work;Shop;Meal Prep     Stability/Clinical Decision Making Evolving/Moderate complexity    Clinical Decision Making Moderate    Rehab Potential Good    PT Frequency 1x / week    PT Duration 12 weeks    PT Treatment/Interventions ADLs/Self Care Home Management;Aquatic Therapy;Functional mobility training;Patient/family education;Therapeutic activities;Therapeutic exercise;Neuromuscular re-education;Manual techniques;Taping;Scar mobilization;Passive range of motion;Energy conservation;Joint Manipulations    PT Next Visit Plan go over HEP, hip/pelvic  stretching    PT Home Exercise Plan [email or web address]    Consulted and Agree with Plan of Care Patient             Patient will benefit from skilled therapeutic intervention in order to improve the following deficits and impairments:  Increased fascial restricitons, Difficulty walking, Decreased coordination, Decreased endurance, Increased muscle spasms, Pain, Decreased activity tolerance, Impaired flexibility, Improper body mechanics, Postural dysfunction, Decreased strength, Decreased mobility  Visit Diagnosis: Muscle weakness (generalized)  Lack of coordination  Cramp and spasm  Abnormal posture  Abnormality of gait and mobility     Problem List Patient Active Problem List   Diagnosis Date Noted   Anemia 11/09/2020   Supervision of other high risk pregnancy, antepartum 10/11/2020   Gestational diabetes 10/11/2020   Short interval between pregnancies affecting pregnancy, antepartum 10/11/2020   AMA (advanced maternal age) multigravida 35+ 10/11/2020   Language barrier affecting health care 10/28/2019   Sciatica of right side 09/24/2019    Otelia Sergeant, PT, DPT 12/14/224:11 PM   [facility] @ [facility] [address] [place], [place], [address] Phone: [phone]   Fax:  [phone]  Name: Fumiye Lubben MRN: [id number] Date of Birth: [birth year]

## 2021-03-05 ENCOUNTER — Encounter: Payer: Medicaid Other | Admitting: Physical Therapy

## 2021-03-06 ENCOUNTER — Ambulatory Visit: Payer: Medicaid Other | Admitting: *Deleted

## 2021-03-06 ENCOUNTER — Other Ambulatory Visit: Payer: Self-pay

## 2021-03-06 ENCOUNTER — Encounter: Payer: Self-pay | Admitting: Family Medicine

## 2021-03-06 ENCOUNTER — Ambulatory Visit (INDEPENDENT_AMBULATORY_CARE_PROVIDER_SITE_OTHER): Payer: Medicaid Other | Admitting: Family Medicine

## 2021-03-06 ENCOUNTER — Other Ambulatory Visit: Payer: Self-pay | Admitting: *Deleted

## 2021-03-06 ENCOUNTER — Ambulatory Visit: Payer: Medicaid Other | Attending: Obstetrics and Gynecology

## 2021-03-06 VITALS — BP 116/67 | HR 91

## 2021-03-06 VITALS — BP 98/64 | HR 102 | Wt 160.8 lb

## 2021-03-06 DIAGNOSIS — Z789 Other specified health status: Secondary | ICD-10-CM

## 2021-03-06 DIAGNOSIS — O09899 Supervision of other high risk pregnancies, unspecified trimester: Secondary | ICD-10-CM

## 2021-03-06 DIAGNOSIS — O24410 Gestational diabetes mellitus in pregnancy, diet controlled: Secondary | ICD-10-CM

## 2021-03-06 DIAGNOSIS — O09523 Supervision of elderly multigravida, third trimester: Secondary | ICD-10-CM

## 2021-03-06 DIAGNOSIS — Z3A33 33 weeks gestation of pregnancy: Secondary | ICD-10-CM | POA: Diagnosis not present

## 2021-03-06 DIAGNOSIS — M5431 Sciatica, right side: Secondary | ICD-10-CM

## 2021-03-06 NOTE — Progress Notes (Signed)
° °  Subjective:  Carol Rangel is a 35 y.o. G2P1001 at [redacted]w[redacted]d being seen today for ongoing prenatal care.  She is currently monitored for the following issues for this high-risk pregnancy and has Sciatica of right side; Language barrier affecting health care; Supervision of other high risk pregnancy, antepartum; Gestational diabetes; Short interval between pregnancies affecting pregnancy, antepartum; AMA (advanced maternal age) multigravida 35+; and Anemia on their problem list.  Patient reports  ongoing significant pelvic pain .  Contractions: Irritability. Vag. Bleeding: None.  Movement: Present. Denies leaking of fluid.   The following portions of the patient's history were reviewed and updated as appropriate: allergies, current medications, past family history, past medical history, past social history, past surgical history and problem list. Problem list updated.  Objective:   Vitals:   03/06/21 1002  BP: 98/64  Pulse: (!) 102  Weight: 160 lb 12.8 oz (72.9 kg)    Fetal Status: Fetal Heart Rate (bpm): 133   Movement: Present     General:  Alert, oriented and cooperative. Patient is in no acute distress.  Skin: Skin is warm and dry. No rash noted.   Cardiovascular: Normal heart rate noted  Respiratory: Normal respiratory effort, no problems with respiration noted  Abdomen: Soft, gravid, appropriate for gestational age. Pain/Pressure: Present     Pelvic: Vag. Bleeding: None     Cervical exam deferred        Extremities: Normal range of motion.  Edema: None  Mental Status: Normal mood and affect. Normal behavior. Normal judgment and thought content.   Urinalysis:      Assessment and Plan:  Pregnancy: G2P1001 at [redacted]w[redacted]d  1. Supervision of other high risk pregnancy, antepartum BP and FHR normal Offered flu shot, declines  2. Diet controlled gestational diabetes mellitus (GDM) in third trimester Did not bring log From recall, checking QID In AM gets high 80's to low 90's Reports  breakfast and lunch usually at goal Occasional dinner time excursions Encouraged diet adherence, bring log to visit Has follow up growth [place] scheduled for later today  3. Language barrier affecting health care Burmese but declined interpreter  4. Multigravida of advanced maternal age in third trimester   5. Sciatica of right side Ongoing pelvic/pubic pain Going to PT, using walker Doesn't feel it is helping that much, encouraged her to continue and give it time Asked about mode of delivery, at present no indication for cesarean Concerned about pain and bleeding, we discussed these would likely be worse with cesarean delivery  Preterm labor symptoms and general obstetric precautions including but not limited to vaginal bleeding, contractions, leaking of fluid and fetal movement were reviewed in detail with the patient. Please refer to After Visit Summary for other counseling recommendations.  Return in 2 weeks (on 03/20/2021) for [facility], ob visit.   Venora Maples, MD

## 2021-03-06 NOTE — Patient Instructions (Signed)

## 2021-03-06 NOTE — Progress Notes (Signed)
Fasting sugars in the 80's low 90's Lunch sugars 110's Dinner 110-130's

## 2021-03-18 NOTE — L&D Delivery Note (Signed)
OB/GYN Faculty Practice Delivery Note  Carol Rangel is a 36 y.o. G2P1001 s/p VD at [redacted]w[redacted]d. She was admitted for SOL.   ROM: 0h 40m with clear fluid GBS Status:  Negative/-- (01/12 1542) Maximum Maternal Temperature: Afebrile   Labor Progress: Initial SVE: 5/70/-2. She then progressed to complete with assistance of pit with evitable delivery.   Delivery Date/Time: 1/18 at 0426 Delivery: Called to room and patient was complete and pushing. Head delivered midline OA. No nuchal cord present. Shoulder and body delivered in usual fashion with mild difficulty delivering anterior shoulder, hooked within axilla. Infant with spontaneous cry, placed on mother's abdomen, dried and stimulated. Cord clamped x 2 after 1-minute delay, and cut by FOB. Cord blood drawn. Placenta delivered spontaneously with gentle cord traction. Infant and placenta delivered by Dr. Darletta Moll, MD, PGY-2. Laceration repair and bleeding management performed by myself.   Fundus firm with massage and Pitocin, however continued to ooze. Lower uterine segment noted to be boggy, performed lower uterine massage several times with improvement. Given TXA and methergine. I&O cath with about 50 cc clear urine. Labia, perineum, vagina, and cervix inspected with a right vaginal wall laceration that extended up along her right labia and a small left 1st degree perineal. These were repaired in usual fashion with 3.0 Monocryl and 3.0 Vicryl respectively.   Baby Weight: pending  Placenta: 3 vessel, intact on visual inspection. Sent to L&D.  Complications: None Lacerations: Right Vaginal wall with labial extension and left 1st degree perineal  EBL: 600 mL Analgesia: Epidural and lidocaine    Infant:  APGAR (1 MIN): 8 APGAR (5 MINS): 9  Leticia Penna, DO  OB Family Medicine Fellow, [facility] for [facility], [facility] 04/04/2021, 5:13 PM

## 2021-03-22 ENCOUNTER — Ambulatory Visit (INDEPENDENT_AMBULATORY_CARE_PROVIDER_SITE_OTHER): Payer: Medicaid Other | Admitting: Advanced Practice Midwife

## 2021-03-22 ENCOUNTER — Other Ambulatory Visit: Payer: Self-pay

## 2021-03-22 VITALS — BP 100/60 | HR 113 | Wt 162.0 lb

## 2021-03-22 DIAGNOSIS — O24410 Gestational diabetes mellitus in pregnancy, diet controlled: Secondary | ICD-10-CM

## 2021-03-22 DIAGNOSIS — O09523 Supervision of elderly multigravida, third trimester: Secondary | ICD-10-CM

## 2021-03-22 DIAGNOSIS — Z789 Other specified health status: Secondary | ICD-10-CM

## 2021-03-22 DIAGNOSIS — O09899 Supervision of other high risk pregnancies, unspecified trimester: Secondary | ICD-10-CM

## 2021-03-22 NOTE — Progress Notes (Signed)
Patient reports contractions yesterday afternoon. Each contraction lasted more than 1 minute and were 5 minutes apart

## 2021-03-23 NOTE — Progress Notes (Signed)
° °  PRENATAL VISIT NOTE  Subjective:  Carol Rangel is a 36 y.o. G2P1001 at [redacted]w[redacted]d being seen today for ongoing prenatal care.  She is currently monitored for the following issues for this high-risk pregnancy and has Sciatica of right side; Language barrier affecting health care; Supervision of other high risk pregnancy, antepartum; Gestational diabetes; Short interval between pregnancies affecting pregnancy, antepartum; AMA (advanced maternal age) multigravida 35+; and Anemia on their problem list.  Patient reports  contractions yesterday, none today . She declines cervical exam Contractions: Irritability. Vag. Bleeding: None.  Movement: Present. Denies leaking of fluid.   The following portions of the patient's history were reviewed and updated as appropriate: allergies, current medications, past family history, past medical history, past social history, past surgical history and problem list. Problem list updated.  Objective:   Vitals:   03/22/21 1528  BP: 100/60  Pulse: (!) 113  Weight: 162 lb (73.5 kg)    Fetal Status: Fetal Heart Rate (bpm): 141 Fundal Height: 35 cm Movement: Present     General:  Alert, oriented and cooperative. Patient is in no acute distress.  Skin: Skin is warm and dry. No rash noted.   Cardiovascular: Normal heart rate noted  Respiratory: Normal respiratory effort, no problems with respiration noted  Abdomen: Soft, gravid, appropriate for gestational age.  Pain/Pressure: Present     Pelvic: Cervical exam deferred        Extremities: Normal range of motion.  Edema: None  Mental Status: Normal mood and affect. Normal behavior. Normal judgment and thought content.   Assessment and Plan:  Pregnancy: G2P1001 at [redacted]w[redacted]d  1. Supervision of other high risk pregnancy, antepartum - Labor precautions reviewed during visit - Needs GBS GC next visit - Desires IOL at 39 weeks. Scheduled for 01/27, orders placed for buccal Cytotec  2. Diet controlled gestational diabetes  mellitus (GDM) in third trimester - Record brought to visit, scanned into media tab. All WNL  3. Short interval between pregnancies affecting pregnancy, antepartum   4. Multigravida of advanced maternal age in third trimester   5. Language barrier affecting health care - Declines interpreter  Preterm labor symptoms and general obstetric precautions including but not limited to vaginal bleeding, contractions, leaking of fluid and fetal movement were reviewed in detail with the patient. Please refer to After Visit Summary for other counseling recommendations.   Return in about 1 week (around 03/29/2021) for APP or MD.  Future Appointments  Date Time Provider [place]  03/30/2021  8:45 AM Junie Panning, PT [facility] None  04/02/2021  4:15 PM Aletha Halim, MD [facility]  04/03/2021  3:15 PM [facility] NURSE [facility]  04/03/2021  3:30 PM [facility] US3 [facility]  04/13/2021  6:30 AM [facility] [place] None   Patient requests female providers and needs appt week of 09 Jan. Message sent to [facility] front desk on 03/23/2020 requesting change in appointment listed above  Darlina Rumpf, CNM

## 2021-03-29 ENCOUNTER — Other Ambulatory Visit (HOSPITAL_COMMUNITY)
Admission: RE | Admit: 2021-03-29 | Discharge: 2021-03-29 | Disposition: A | Discharge status: Home / Self Care | Payer: Medicaid Other | Source: Ambulatory Visit | Attending: Obstetrics and Gynecology | Admitting: Obstetrics and Gynecology

## 2021-03-29 ENCOUNTER — Other Ambulatory Visit: Payer: Self-pay

## 2021-03-29 ENCOUNTER — Ambulatory Visit (INDEPENDENT_AMBULATORY_CARE_PROVIDER_SITE_OTHER): Payer: Medicaid Other | Admitting: Family Medicine

## 2021-03-29 VITALS — BP 117/75 | HR 118 | Wt 161.9 lb

## 2021-03-29 DIAGNOSIS — O24410 Gestational diabetes mellitus in pregnancy, diet controlled: Secondary | ICD-10-CM

## 2021-03-29 DIAGNOSIS — O09899 Supervision of other high risk pregnancies, unspecified trimester: Secondary | ICD-10-CM | POA: Diagnosis present

## 2021-03-29 DIAGNOSIS — O09523 Supervision of elderly multigravida, third trimester: Secondary | ICD-10-CM

## 2021-03-29 DIAGNOSIS — Z789 Other specified health status: Secondary | ICD-10-CM

## 2021-03-29 NOTE — Progress Notes (Signed)
° °  PRENATAL VISIT NOTE  Subjective:  Carol Rangel is a 36 y.o. G2P1001 at [redacted]w[redacted]d being seen today for ongoing prenatal care.  She is currently monitored for the following issues for this high-risk pregnancy and has Sciatica of right side; Language barrier affecting health care; Supervision of other high risk pregnancy, antepartum; Gestational diabetes; Short interval between pregnancies affecting pregnancy, antepartum; AMA (advanced maternal age) multigravida 35+; and Anemia on their problem list.  Patient reports no complaints.  Contractions: Irritability. Vag. Bleeding: Scant.  Movement: Present. Denies leaking of fluid.   The following portions of the patient's history were reviewed and updated as appropriate: allergies, current medications, past family history, past medical history, past social history, past surgical history and problem list.   Objective:   Vitals:   03/29/21 1507  BP: 117/75  Pulse: (!) 118  Weight: 161 lb 14.4 oz (73.4 kg)    Fetal Status: Fetal Heart Rate (bpm): 127 Fundal Height: 32 cm Movement: Present  Presentation: Vertex  General:  Alert, oriented and cooperative. Patient is in no acute distress.  Skin: Skin is warm and dry. No rash noted.   Cardiovascular: Normal heart rate noted  Respiratory: Normal respiratory effort, no problems with respiration noted  Abdomen: Soft, gravid, appropriate for gestational age.  Pain/Pressure: Present     Pelvic: Cervical exam deferred        Extremities: Normal range of motion.  Edema: None  Mental Status: Normal mood and affect. Normal behavior. Normal judgment and thought content.   Assessment and Plan:  Pregnancy: G2P1001 at [redacted]w[redacted]d 1. Supervision of other high risk pregnancy, antepartum Cultures today Declined cervical exam - Culture, beta strep (group b only) - GC/Chlamydia probe amp ([place])not at [facility]  2. Diet controlled gestational diabetes mellitus (GDM) in third trimester   See CBG log, all in range Has  growth scan 1/17--last at 41% IOL scheduled  3. Language barrier affecting health care Did not need interpreter today  4. Multigravida of advanced maternal age in third trimester Declined genetics  Preterm labor symptoms and general obstetric precautions including but not limited to vaginal bleeding, contractions, leaking of fluid and fetal movement were reviewed in detail with the patient. Please refer to After Visit Summary for other counseling recommendations.   Return in 1 week (on 04/05/2021) for [facility].  Future Appointments  Date Time Provider [place]  03/30/2021  8:45 AM Junie Panning, PT [facility] None  04/03/2021  3:15 PM [facility] NURSE [facility]  04/03/2021  3:30 PM [facility] US3 [facility]  04/05/2021  2:15 PM Donnamae Jude, MD [facility]  04/13/2021  6:30 AM [facility] SCHED ROOM [facility] None    Donnamae Jude, MD

## 2021-03-30 ENCOUNTER — Ambulatory Visit: Payer: Medicaid Other | Attending: Obstetrics and Gynecology | Admitting: Physical Therapy

## 2021-03-30 ENCOUNTER — Telehealth (HOSPITAL_COMMUNITY): Payer: Self-pay | Admitting: *Deleted

## 2021-03-30 ENCOUNTER — Telehealth: Payer: Self-pay | Admitting: Physical Therapy

## 2021-03-30 DIAGNOSIS — M6281 Muscle weakness (generalized): Secondary | ICD-10-CM | POA: Insufficient documentation

## 2021-03-30 DIAGNOSIS — R279 Unspecified lack of coordination: Secondary | ICD-10-CM | POA: Insufficient documentation

## 2021-03-30 LAB — GC/CHLAMYDIA PROBE AMP (~~LOC~~) NOT AT ARMC
Chlamydia: NEGATIVE
Comment: NEGATIVE
Comment: NORMAL
Neisseria Gonorrhea: NEGATIVE

## 2021-03-30 NOTE — Telephone Encounter (Signed)
PT called pt about this morning's appointment at 0845a. Pt did not answer, voicemail left.   Stacy Gardner, PT, DPT 01/13/239:06 AM

## 2021-03-30 NOTE — Telephone Encounter (Signed)
Preadmission screen  

## 2021-04-02 ENCOUNTER — Telehealth (HOSPITAL_COMMUNITY): Payer: Self-pay | Admitting: *Deleted

## 2021-04-02 ENCOUNTER — Encounter: Payer: Medicaid Other | Admitting: Obstetrics and Gynecology

## 2021-04-02 ENCOUNTER — Encounter (HOSPITAL_COMMUNITY): Payer: Self-pay | Admitting: *Deleted

## 2021-04-02 ENCOUNTER — Encounter (HOSPITAL_COMMUNITY): Payer: Self-pay

## 2021-04-02 LAB — CULTURE, BETA STREP (GROUP B ONLY): Strep Gp B Culture: NEGATIVE

## 2021-04-02 NOTE — Telephone Encounter (Signed)
Preadmission screen  

## 2021-04-03 ENCOUNTER — Encounter (HOSPITAL_COMMUNITY): Payer: Self-pay | Admitting: Obstetrics & Gynecology

## 2021-04-03 ENCOUNTER — Inpatient Hospital Stay (HOSPITAL_COMMUNITY): Payer: Medicaid Other | Admitting: Anesthesiology

## 2021-04-03 ENCOUNTER — Ambulatory Visit: Payer: Medicaid Other | Admitting: *Deleted

## 2021-04-03 ENCOUNTER — Ambulatory Visit (HOSPITAL_BASED_OUTPATIENT_CLINIC_OR_DEPARTMENT_OTHER): Payer: Medicaid Other

## 2021-04-03 ENCOUNTER — Inpatient Hospital Stay (HOSPITAL_BASED_OUTPATIENT_CLINIC_OR_DEPARTMENT_OTHER): Payer: Medicaid Other | Admitting: Obstetrics

## 2021-04-03 ENCOUNTER — Other Ambulatory Visit: Payer: Self-pay

## 2021-04-03 ENCOUNTER — Inpatient Hospital Stay (HOSPITAL_COMMUNITY)
Admission: AD | Admit: 2021-04-03 | Discharge: 2021-04-05 | DRG: 806 | Disposition: A | Discharge status: Home / Self Care | Payer: Medicaid Other | Attending: Obstetrics and Gynecology | Admitting: Obstetrics and Gynecology

## 2021-04-03 VITALS — BP 118/77 | HR 91

## 2021-04-03 DIAGNOSIS — O24410 Gestational diabetes mellitus in pregnancy, diet controlled: Secondary | ICD-10-CM

## 2021-04-03 DIAGNOSIS — Z20822 Contact with and (suspected) exposure to covid-19: Secondary | ICD-10-CM | POA: Diagnosis present

## 2021-04-03 DIAGNOSIS — O99892 Other specified diseases and conditions complicating childbirth: Secondary | ICD-10-CM | POA: Diagnosis present

## 2021-04-03 DIAGNOSIS — O09899 Supervision of other high risk pregnancies, unspecified trimester: Secondary | ICD-10-CM

## 2021-04-03 DIAGNOSIS — O09523 Supervision of elderly multigravida, third trimester: Secondary | ICD-10-CM

## 2021-04-03 DIAGNOSIS — O09529 Supervision of elderly multigravida, unspecified trimester: Secondary | ICD-10-CM

## 2021-04-03 DIAGNOSIS — W1839XA Other fall on same level, initial encounter: Secondary | ICD-10-CM | POA: Diagnosis not present

## 2021-04-03 DIAGNOSIS — Z3A37 37 weeks gestation of pregnancy: Secondary | ICD-10-CM

## 2021-04-03 DIAGNOSIS — M5127 Other intervertebral disc displacement, lumbosacral region: Secondary | ICD-10-CM | POA: Diagnosis present

## 2021-04-03 DIAGNOSIS — D509 Iron deficiency anemia, unspecified: Secondary | ICD-10-CM | POA: Diagnosis present

## 2021-04-03 DIAGNOSIS — O9902 Anemia complicating childbirth: Secondary | ICD-10-CM | POA: Diagnosis present

## 2021-04-03 DIAGNOSIS — O24420 Gestational diabetes mellitus in childbirth, diet controlled: Principal | ICD-10-CM | POA: Diagnosis present

## 2021-04-03 DIAGNOSIS — Y92231 Patient bathroom in hospital as the place of occurrence of the external cause: Secondary | ICD-10-CM | POA: Diagnosis not present

## 2021-04-03 DIAGNOSIS — R102 Pelvic and perineal pain: Secondary | ICD-10-CM | POA: Diagnosis present

## 2021-04-03 DIAGNOSIS — O24419 Gestational diabetes mellitus in pregnancy, unspecified control: Secondary | ICD-10-CM

## 2021-04-03 DIAGNOSIS — D649 Anemia, unspecified: Secondary | ICD-10-CM | POA: Diagnosis present

## 2021-04-03 DIAGNOSIS — Z789 Other specified health status: Secondary | ICD-10-CM | POA: Diagnosis present

## 2021-04-03 DIAGNOSIS — R200 Anesthesia of skin: Secondary | ICD-10-CM | POA: Diagnosis not present

## 2021-04-03 LAB — GLUCOSE, CAPILLARY
Glucose-Capillary: 71 mg/dL (ref 70–99)
Glucose-Capillary: 79 mg/dL (ref 70–99)

## 2021-04-03 LAB — CBC
HCT: 35.5 % — ABNORMAL LOW (ref 36.0–46.0)
Hemoglobin: 11.2 g/dL — ABNORMAL LOW (ref 12.0–15.0)
MCH: 28.9 pg (ref 26.0–34.0)
MCHC: 31.5 g/dL (ref 30.0–36.0)
MCV: 91.5 fL (ref 80.0–100.0)
Platelets: 157 10*3/uL (ref 150–400)
RBC: 3.88 MIL/uL (ref 3.87–5.11)
RDW: 14.1 % (ref 11.5–15.5)
WBC: 9.3 10*3/uL (ref 4.0–10.5)
nRBC: 0 % (ref 0.0–0.2)

## 2021-04-03 LAB — TYPE AND SCREEN
ABO/RH(D): A POS
Antibody Screen: NEGATIVE

## 2021-04-03 LAB — RESP PANEL BY RT-PCR (FLU A&B, COVID) ARPGX2
Influenza A by PCR: NEGATIVE
Influenza B by PCR: NEGATIVE
SARS Coronavirus 2 by RT PCR: NEGATIVE

## 2021-04-03 IMAGING — US US MFM OB FOLLOW-UP
1 series · 13 of 28 positions shown · non-contrast
Comparison: none

[Series 1: us mfm ob follow-up · 29 acquisitions, 13 frames shown]
[im 2/29]
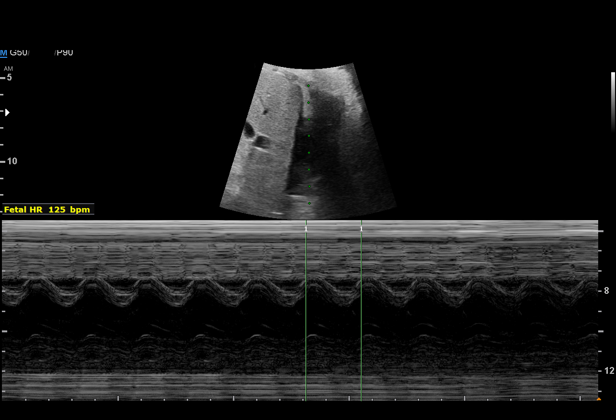
[im 4/29]
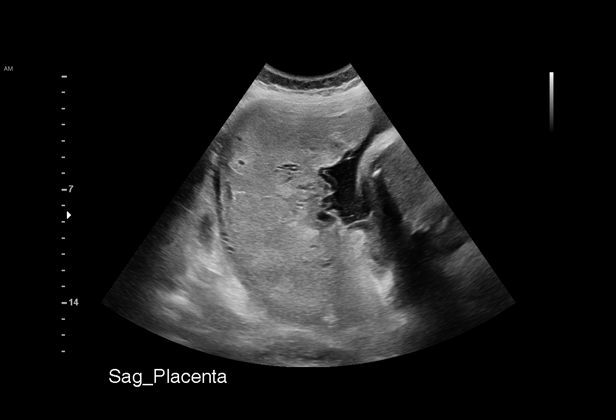
[im 6/29]
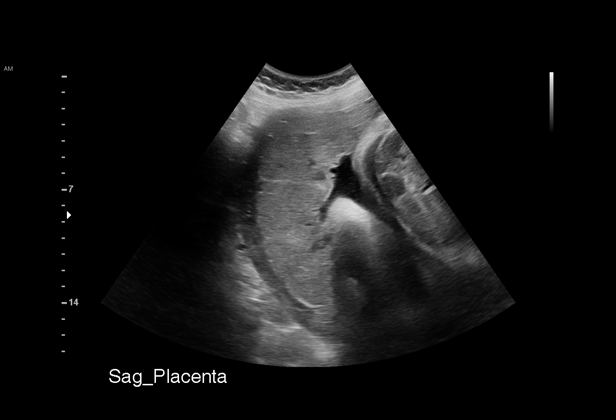
[im 8/29]
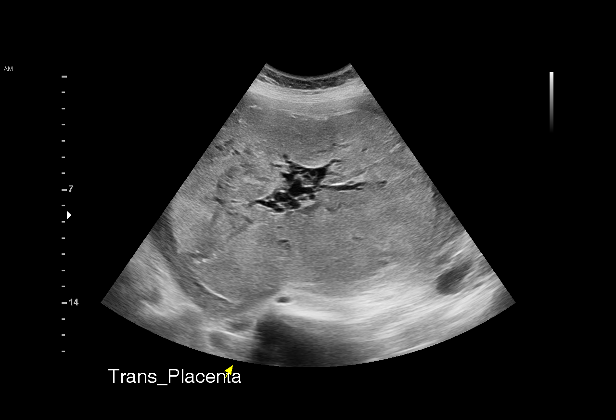
[im 10/29]
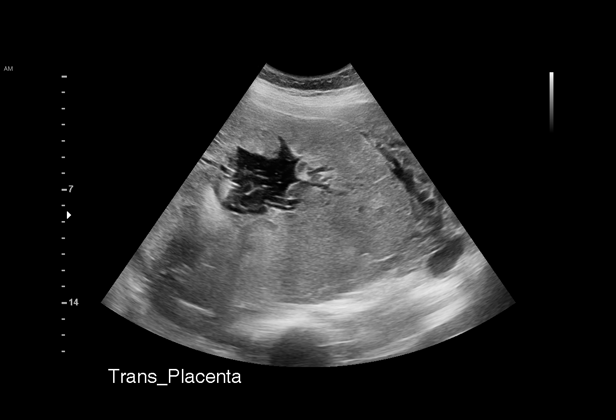
[im 12/29]
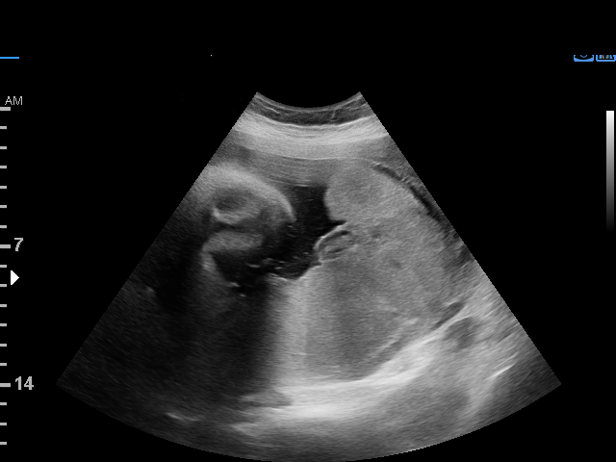
[im 15/29]
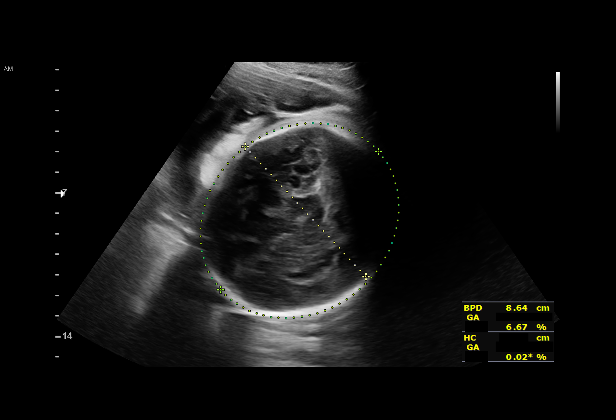
[im 17/29]
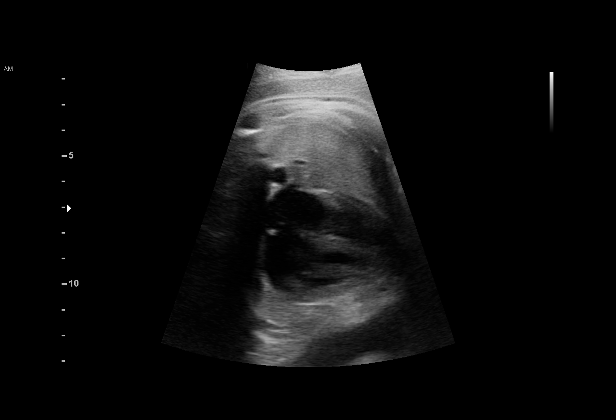
[im 19/29]
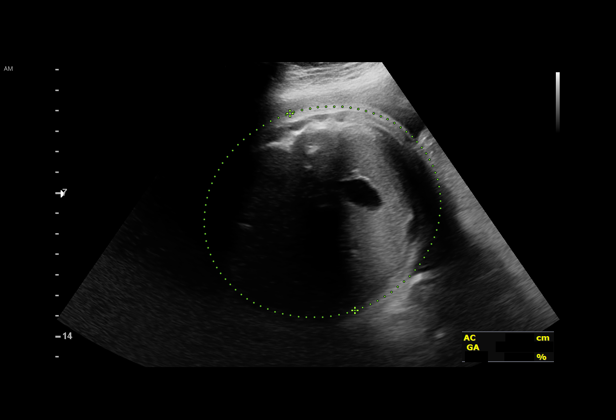
[im 21/29]
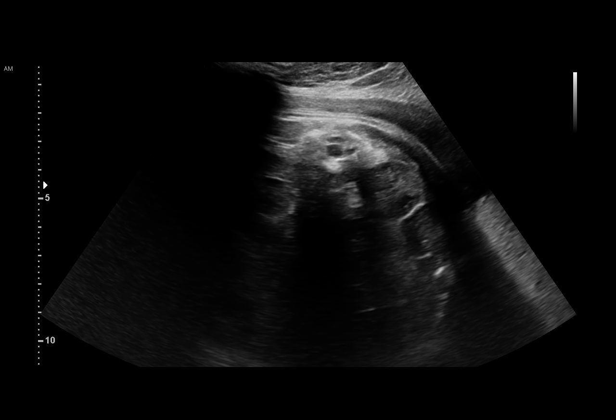
[im 23/29]
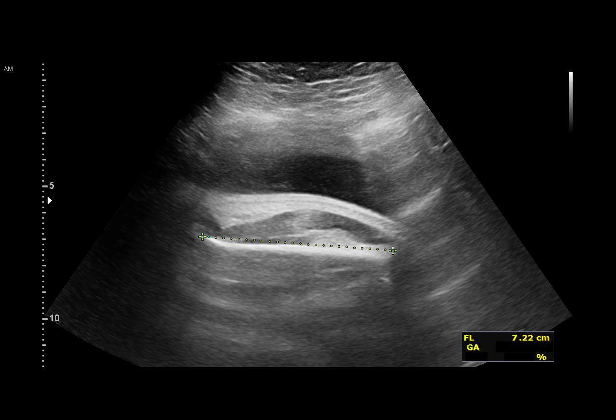
[im 25/29]
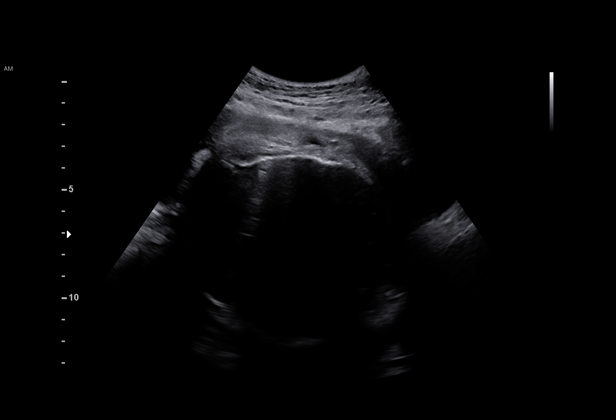
[im 27/29]
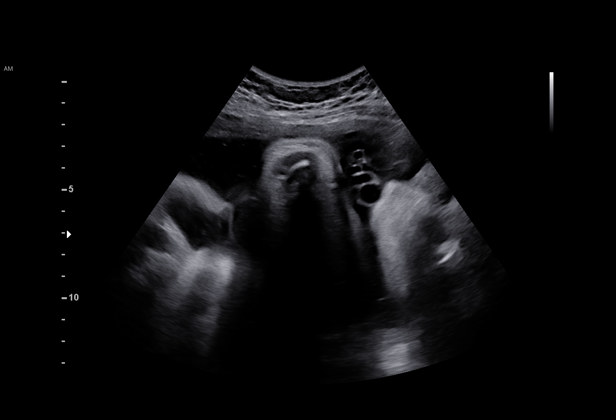

[13 of 28 positions shown; findings below may reference images not displayed]

Addendum:\.br----------------------------------------------------------------------
----------------------------------------------------------------------

----------------------------------------------------------------------

----------------------------------------------------------------------

----------------------------------------------------------------------

----------------------------------------------------------------------
Indications

 Gestational diabetes in pregnancy, diet        [LD]
 controlled
 Advanced maternal age multigravida 35+,        [LD]
 third trimester
 Short interval between pregancies, 3rd         [LD]
 trimester
 37 weeks gestation of pregnancy
 Declined genetic testing
----------------------------------------------------------------------
Fetal Evaluation

 Num Of Fetuses:         1
 Fetal Heart Rate(bpm):  125
 Cardiac Activity:       Observed
 Presentation:           Cephalic
 Placenta:               Posterior Fundal
 P. Cord Insertion:      Previously Visualized

 Amniotic Fluid
 AFI FV:      Within normal limits

 AFI Sum(cm)     %Tile       Largest Pocket(cm)
 13.41           50          5

 RUQ(cm)       RLQ(cm)       LUQ(cm)        LLQ(cm)
 1.81          5
----------------------------------------------------------------------
Biometry
 BPD:      87.3  mm     G. Age:  35w 2d         12  %    CI:        81.56   %    70 - 86
                                                         FL/HC:      23.7   %    20.9 -
 HC:      305.1  mm     G. Age:  34w 0d        < 1  %    HC/AC:      0.89        0.92 -
 AC:      340.9  mm     G. Age:  38w 0d         77  %    FL/BPD:     82.7   %    71 - 87
 FL:       72.2  mm     G. Age:  37w 0d         35  %    FL/AC:      21.2   %    20 - 24

 Est. FW:    [LD]  gm    6 lb 13 oz      43  %
----------------------------------------------------------------------
OB History

 Gravidity:    2         Term:   1
 Living:       1
----------------------------------------------------------------------
Gestational Age

 LMP:           37w 4d        Date:  [DATE]                 EDD:   [DATE]
 U/S Today:     36w 1d                                        EDD:   [DATE]
 Best:          37w 4d     Det. By:  LMP  ([DATE])          EDD:   [DATE]
----------------------------------------------------------------------
Anatomy

 Cranium:               Appears normal         LVOT:                   Previously seen
 Cavum:                 Previously seen        Aortic Arch:            Previously seen
 Ventricles:            Appears normal         Ductal Arch:            Previously seen
 Choroid Plexus:        Previously seen        Diaphragm:              Previously seen
 Cerebellum:            Previously seen        Stomach:                Appears normal, left
                                                                       sided
 Posterior Fossa:       Previously seen        Abdomen:                Previously seen
 Nuchal Fold:           Not applicable (>20    Abdominal Wall:         Previously seen
                        wks GA)
 Face:                  Orbits and profile     Cord Vessels:           Previously seen
                        previously seen
 Lips:                  Previously seen        Kidneys:                Appear normal
 Palate:                Previously seen        Bladder:                Appears normal
 Thoracic:              Previously seen        Spine:                  Previously seen
 Heart:                 Appears normal         Upper Extremities:      Previously seen
                        (4CH, axis, and
                        situs)
 RVOT:                  Previously seen        Lower Extremities:      Previously seen

 Other:  Female genitalia previously seen. Nasal bone, lenses Heels/feet and
         open hands previously visualized. VC, 3VV and 3VTV visualized.
----------------------------------------------------------------------
Cervix Uterus Adnexa

 Cervix
 Not visualized (advanced GA >[LD])

 Right Ovary
 Not visualized.

 Left Ovary
 Not visualized.
----------------------------------------------------------------------
Comments

 [facility] was seen for a follow up growth scan due to diet-
 controlled gestational diabetes.  The patient reports that she
 fell in the bathroom yesterday but did not hit her belly.  She
 has felt fetal movements throughout the day.  The patient has
 been feeling frequent painful contractions every 7 to 8
 minutes.  These contractions are taking her breath away.
 She was informed that the fetal growth and amniotic fluid
 level appears appropriate for her gestational age.  The overall
 EFW measures 6 pounds 13 ounces today.
 Vigorous fetal movements were noted throughout today's
 exam.
 Due to the frequent contractions, the patient was sent to the
 [facility] following today's ultrasound exam for a labor check.
 Due to gestational diabetes, she is already scheduled for an
 induction on [DATE].
 A total of 20 minutes was spent counseling and coordinating
 the care for this patient.  Greater than 50% of the time was
 spent in direct face-to-face contact.
----------------------------------------------------------------------
----------------------------------------------------------------------

*** End of Addendum ***\.br----------------------------------------------------------------------
----------------------------------------------------------------------

----------------------------------------------------------------------

----------------------------------------------------------------------

----------------------------------------------------------------------

----------------------------------------------------------------------
Indications

 Gestational diabetes in pregnancy, diet        [LD]
 controlled
 Advanced maternal age multigravida 35+,        [LD]
 third trimester
 Short interval between pregancies, 3rd         [LD]
 trimester
 37 weeks gestation of pregnancy
 Declined genetic testing
----------------------------------------------------------------------
Fetal Evaluation

 Num Of Fetuses:         1
 Fetal Heart Rate(bpm):  125
 Cardiac Activity:       Observed
 Presentation:           Cephalic
 Placenta:               Posterior Fundal
 P. Cord Insertion:      Previously Visualized

 Amniotic Fluid
 AFI FV:      Within normal limits

 AFI Sum(cm)     %Tile       Largest Pocket(cm)
 13.41           50          5

 RUQ(cm)       RLQ(cm)       LUQ(cm)        LLQ(cm)
 1.81          5
----------------------------------------------------------------------
Biometry
 BPD:      87.3  mm     G. Age:  35w 2d         12  %    CI:        81.56   %    70 - 86
                                                         FL/HC:      23.7   %    20.9 -
 HC:      305.1  mm     G. Age:  34w 0d        < 1  %    HC/AC:      0.89        0.92 -
 AC:      340.9  mm     G. Age:  38w 0d         77  %    FL/BPD:     82.7   %    71 - 87
 FL:       72.2  mm     G. Age:  37w 0d         35  %    FL/AC:      21.2   %    20 - 24

 Est. FW:    [LD]  gm    6 lb 13 oz      43  %
----------------------------------------------------------------------
OB History

 Gravidity:    2         Term:   1
 Living:       1
----------------------------------------------------------------------
Gestational Age

 LMP:           37w 4d        Date:  [DATE]                 EDD:   [DATE]
 U/S Today:     36w 1d                                        EDD:   [DATE]
 Best:          37w 4d     Det. By:  LMP  ([DATE])          EDD:   [DATE]
----------------------------------------------------------------------
Anatomy

 Cranium:               Appears normal         LVOT:                   Previously seen
 Cavum:                 Previously seen        Aortic Arch:            Previously seen
 Ventricles:            Appears normal         Ductal Arch:            Previously seen
 Choroid Plexus:        Previously seen        Diaphragm:              Previously seen
 Cerebellum:            Previously seen        Stomach:                Appears normal, left
                                                                       sided
 Posterior Fossa:       Previously seen        Abdomen:                Previously seen
 Nuchal Fold:           Not applicable (>20    Abdominal Wall:         Previously seen
                        wks GA)
 Face:                  Orbits and profile     Cord Vessels:           Previously seen
                        previously seen
 Lips:                  Previously seen        Kidneys:                Appear normal
 Palate:                Previously seen        Bladder:                Appears normal
 Thoracic:              Previously seen        Spine:                  Previously seen
 Heart:                 Appears normal         Upper Extremities:      Previously seen
                        (4CH, axis, and
                        situs)
 RVOT:                  Previously seen        Lower Extremities:      Previously seen

 Other:  Female genitalia previously seen. Nasal bone, lenses Heels/feet and
         open hands previously visualized. VC, 3VV and 3VTV visualized.
----------------------------------------------------------------------
Cervix Uterus Adnexa

 Cervix
 Not visualized (advanced GA >[LD])

 Right Ovary
 Not visualized.

 Left Ovary
 Not visualized.
----------------------------------------------------------------------
Comments

 This patient was seen for a follow up growth scan due to diet-
 controlled gestational diabetes.  The patient reports that she
 fell in the bathroom yesterday but did not hit her belly.  She
 has felt fetal movements throughout the day.  The patient has
 been feeling frequent painful contractions every 7 to 8
 minutes.  These contractions are taking her breath away.
 She was informed that the fetal growth and amniotic fluid
 level appears appropriate for her gestational age.  The overall
 EFW measures 6 pounds 13 ounces today.
 Vigorous fetal movements were noted throughout today's
 exam.
 Due to the frequent contractions, the patient was sent to the
 [facility] following today's ultrasound exam for a labor check.
 Due to gestational diabetes, she is already scheduled for an
 induction on [DATE]
----------------------------------------------------------------------
----------------------------------------------------------------------

## 2021-04-03 MED ORDER — FENTANYL-BUPIVACAINE-NACL 0.5-0.125-0.9 MG/250ML-% EP SOLN
EPIDURAL | Status: DC | PRN
  Administered 2021-04-03: 12 mL/h via EPIDURAL

## 2021-04-03 MED ORDER — LACTATED RINGERS IV SOLN
500.0000 mL | INTRAVENOUS | Status: DC | PRN
  Administered 2021-04-03: 500 mL via INTRAVENOUS
  Administered 2021-04-03: 1000 mL via INTRAVENOUS
  Administered 2021-04-04: 500 mL via INTRAVENOUS

## 2021-04-03 MED ORDER — EPHEDRINE 5 MG/ML INJ: 10.0000 mg | INTRAVENOUS | Status: DC | PRN

## 2021-04-03 MED ORDER — LACTATED RINGERS IV SOLN: INTRAVENOUS | Status: DC

## 2021-04-03 MED ORDER — BUPIVACAINE HCL (PF) 0.25 % IJ SOLN
INTRAMUSCULAR | Status: DC | PRN
  Administered 2021-04-03: 8 mL via EPIDURAL
  Administered 2021-04-04: 10 mL via EPIDURAL

## 2021-04-03 MED ORDER — SOD CITRATE-CITRIC ACID 500-334 MG/5ML PO SOLN: 30.0000 mL | ORAL | Status: DC | PRN

## 2021-04-03 MED ORDER — TERBUTALINE SULFATE 1 MG/ML IJ SOLN: 0.2500 mg | Freq: Once | INTRAMUSCULAR | Status: DC | PRN

## 2021-04-03 MED ORDER — MISOPROSTOL 50MCG HALF TABLET: 50.0000 ug | ORAL_TABLET | ORAL | Status: DC | PRN

## 2021-04-03 MED ORDER — FENTANYL-BUPIVACAINE-NACL 0.5-0.125-0.9 MG/250ML-% EP SOLN
12.0000 mL/h | EPIDURAL | Status: DC | PRN
  Filled 2021-04-03: qty 250

## 2021-04-03 MED ORDER — LACTATED RINGERS IV SOLN
500.0000 mL | Freq: Once | INTRAVENOUS | Status: AC
  Administered 2021-04-03: 500 mL via INTRAVENOUS

## 2021-04-03 MED ORDER — OXYTOCIN-SODIUM CHLORIDE 30-0.9 UT/500ML-% IV SOLN
2.5000 [IU]/h | INTRAVENOUS | Status: DC
  Filled 2021-04-03: qty 500

## 2021-04-03 MED ORDER — LIDOCAINE HCL (PF) 1 % IJ SOLN
INTRAMUSCULAR | Status: DC | PRN
  Administered 2021-04-03: 10 mL via EPIDURAL
  Administered 2021-04-03: 2 mL via EPIDURAL

## 2021-04-03 MED ORDER — LIDOCAINE HCL (PF) 1 % IJ SOLN
30.0000 mL | INTRAMUSCULAR | Status: AC | PRN
  Administered 2021-04-04: 30 mL via SUBCUTANEOUS
  Filled 2021-04-03: qty 30

## 2021-04-03 MED ORDER — OXYTOCIN BOLUS FROM INFUSION
333.0000 mL | Freq: Once | INTRAVENOUS | Status: AC
  Administered 2021-04-04: 333 mL via INTRAVENOUS

## 2021-04-03 MED ORDER — DIPHENHYDRAMINE HCL 50 MG/ML IJ SOLN: 12.5000 mg | INTRAMUSCULAR | Status: DC | PRN

## 2021-04-03 MED ORDER — FENTANYL CITRATE (PF) 100 MCG/2ML IJ SOLN
INTRAMUSCULAR | Status: DC | PRN
  Administered 2021-04-03 – 2021-04-04 (×2): 100 ug via EPIDURAL

## 2021-04-03 MED ORDER — FENTANYL CITRATE (PF) 100 MCG/2ML IJ SOLN
INTRAMUSCULAR | Status: AC
  Filled 2021-04-03: qty 2

## 2021-04-03 MED ORDER — PHENYLEPHRINE 40 MCG/ML (10ML) SYRINGE FOR IV PUSH (FOR BLOOD PRESSURE SUPPORT)
80.0000 ug | PREFILLED_SYRINGE | INTRAVENOUS | Status: DC | PRN
  Administered 2021-04-03: 80 ug via INTRAVENOUS

## 2021-04-03 MED ORDER — ACETAMINOPHEN 325 MG PO TABS: 650.0000 mg | ORAL_TABLET | ORAL | Status: DC | PRN

## 2021-04-03 MED ORDER — ONDANSETRON HCL 4 MG/2ML IJ SOLN
4.0000 mg | Freq: Four times a day (QID) | INTRAMUSCULAR | Status: DC | PRN
  Administered 2021-04-03: 4 mg via INTRAVENOUS
  Filled 2021-04-03: qty 2

## 2021-04-03 MED ORDER — PHENYLEPHRINE 40 MCG/ML (10ML) SYRINGE FOR IV PUSH (FOR BLOOD PRESSURE SUPPORT)
80.0000 ug | PREFILLED_SYRINGE | INTRAVENOUS | Status: DC | PRN
  Filled 2021-04-03 (×2): qty 10

## 2021-04-03 NOTE — Progress Notes (Addendum)
Pt reports falling down in bathtub last night and hitting her whole left side. She says she did not hit her abdomen, there has been no vag bleeding, nor did she notify her doctor. She started contracting this morning about 15 apart for a few hours then they went away. Contractions resumed around 2:30 every 8 minutes and much stronger. 79- notified Dr. Donalee Citrin of above note; and that she is rating UC's a "7" out of "10". Notified the sonographer as well.

## 2021-04-03 NOTE — Anesthesia Preprocedure Evaluation (Signed)
Anesthesia Evaluation  Patient identified by MRN, date of birth, ID band Patient awake    Reviewed: Allergy & Precautions, Patient's Chart, lab work & pertinent test results  Airway Mallampati: II  TM Distance: >3 FB Neck ROM: Full    Dental no notable dental hx.    Pulmonary neg pulmonary ROS,    Pulmonary exam normal breath sounds clear to auscultation       Cardiovascular negative cardio ROS Normal cardiovascular exam Rhythm:Regular Rate:Normal     Neuro/Psych negative neurological ROS  negative psych ROS   GI/Hepatic negative GI ROS, Neg liver ROS,   Endo/Other  diabetes, Gestational  Renal/GU negative Renal ROS  negative genitourinary   Musculoskeletal negative musculoskeletal ROS (+)   Abdominal   Peds negative pediatric ROS (+)  Hematology negative hematology ROS (+) hct 35.5, plt 157   Anesthesia Other Findings   Reproductive/Obstetrics (+) Pregnancy                             Anesthesia Physical Anesthesia Plan  ASA: 2  Anesthesia Plan: Epidural   Post-op Pain Management:    Induction:   PONV Risk Score and Plan: 2  Airway Management Planned: Natural Airway  Additional Equipment: None  Intra-op Plan:   Post-operative Plan:   Informed Consent: I have reviewed the patients History and Physical, chart, labs and discussed the procedure including the risks, benefits and alternatives for the proposed anesthesia with the patient or authorized representative who has indicated his/her understanding and acceptance.       Plan Discussed with:   Anesthesia Plan Comments:         Anesthesia Quick Evaluation

## 2021-04-03 NOTE — H&P (Signed)
OBSTETRIC ADMISSION HISTORY AND PHYSICAL  Geanette Caress is a 36 y.o. female G2P1001 with IUP at [redacted]w[redacted]d by LMP presenting for early labor. She reports +FMs, No LOF, no VB, no blurry vision, headaches or peripheral edema, and RUQ pain.  She plans on breast and bottle feeding. She is undecided about birth control. She received her prenatal care at  [place]: By LMP --->  Estimated Date of Delivery: 04/20/21  Sono:   @[redacted]w[redacted]d , CWD, normal anatomy, cephalic presentation, posterior fundal lie, 3076g, 43% EFW   Prenatal History/Complications:  [id number] AMA Anemia Language barrier affecting care Past Medical History: Past Medical History:  Diagnosis Date   Gestational diabetes    Medical history non-contributory    Nausea with vomiting 12/20/2013   Sciatica of right side 09/24/2019    Past Surgical History: Past Surgical History:  Procedure Laterality Date   BREAST SURGERY Right    ?abscess surgery x2    Obstetrical History: OB History     Gravida  2   Para  1   Term  1   Preterm      AB      Living  1      SAB      IAB      Ectopic      Multiple  0   Live Births  1           Social History Social History   Socioeconomic History   Marital status: Single    Spouse name: Not on file   Number of children: Not on file   Years of education: Not on file   Highest education level: High school graduate  Occupational History   Occupation: unemployed  Tobacco Use   Smoking status: Never   Smokeless tobacco: Never  Vaping Use   Vaping Use: Never used  Substance and Sexual Activity   Alcohol use: No   Drug use: No   Sexual activity: Yes    Birth control/protection: None  Other Topics Concern   Not on file  Social History Narrative   Not on file   Social Determinants of Health   Financial Resource Strain: Not on file  Food Insecurity: No Food Insecurity   Worried About Running Out of Food in the Last Year: Never true   [place] in the Last  Year: Never true  Transportation Needs: No Transportation Needs   Lack of Transportation (Medical): No   Lack of Transportation (Non-Medical): No  Physical Activity: Not on file  Stress: Not on file  Social Connections: Not on file    Family History: Family History  Problem Relation Age of Onset   Hypertension Mother    Stroke Father    Hypertension Father    Cancer Maternal Grandmother     Allergies: No Known Allergies  Medications Prior to Admission  Medication Sig Dispense Refill Last Dose   Prenatal Vit-Fe Fumarate-FA (MULTIVITAMIN-PRENATAL) 27-0.8 MG TABS tablet Take 1 tablet by mouth daily at 12 noon.   04/02/2021   Accu-Chek Softclix Lancets lancets Use as instructed 100 each 12    glucose blood (ACCU-CHEK GUIDE) test strip Use as instructed 100 each 3    oxyCODONE-acetaminophen (PERCOCET) 5-325 MG tablet Take 1-2 tablets by mouth every 6 (six) hours as needed for severe pain. (Patient not taking: Reported on 03/29/2021) 20 tablet 0      Review of Systems   All systems reviewed and negative except as stated in HPI  Blood pressure 122/71, pulse 98, temperature 98.4 F (36.9 C), temperature source Oral, resp. rate 18, weight 73.1 kg, last menstrual period 07/14/2020, SpO2 99 %, not currently breastfeeding. General appearance: alert, breathing through contractions and having to stop talking during contractions Lungs: clear to auscultation bilaterally Heart: regular rate and rhythm Abdomen: soft, non-tender; bowel sounds normal Extremities: Homans sign is negative, no sign of DVT Presentation: cephalic by BSUS (in MAU) Fetal monitoringBaseline: 120 bpm, Variability: Good {> 6 bpm), Accelerations: Reactive, and Decelerations: Absent Uterine activityFrequency: Every 2-4 minutes Dilation: 5 Effacement (%): 70 Station: -1, -2 Exam by:: Julianne Handler, CNM   Prenatal labs: ABO, Rh: A/Positive/-- (07/27 1001) Antibody: Negative (07/27 1001) Rubella: 4.74 (07/27  1001) RPR: Non Reactive (11/16 0849)  HBsAg: Negative (07/27 1001)  HIV: Non Reactive (11/16 0849)  GBS: Negative/-- (01/12 1542)  2 hr Glucola abnormal (2 hr 162) Genetic screening  normal horizon carrier testing in prior pregnancy, no testing in current pregnancy Anatomy US normal  Prenatal Transfer Tool  Maternal Diabetes: Yes:  Diabetes Type:  Diet controlled Genetic Screening: ormal horizon carrier testing in prior pregnancy, no testing in current pregnancy Maternal Ultrasounds/Referrals: Normal Fetal Ultrasounds or other Referrals:  None Maternal Substance Abuse:  No Significant Maternal Medications:  None Significant Maternal Lab Results: Group B Strep negative  No results found for this or any previous visit (from the past 24 hour(s)).  Patient Active Problem List   Diagnosis Date Noted   Anemia 11/09/2020   Supervision of other high risk pregnancy, antepartum 10/11/2020   Gestational diabetes 10/11/2020   Short interval between pregnancies affecting pregnancy, antepartum 10/11/2020   AMA (advanced maternal age) multigravida 35+ 10/11/2020   Language barrier affecting health care 10/28/2019   Sciatica of right side 09/24/2019    Assessment/Plan:  Jesselle Sandi is a 36 y.o. G2P1001 at [redacted]w[redacted]d here for labor  #Labor:Patient contracting on own and making cervical change from 2cm to cm. Will continue expectant management at this time. Assess for AROM at next check #Pain: Plans epidural #FWB: Cat I  #ID:  GBS neg #MOF: breast and bottle #MOC: undecided #Circ:  N/A (female)  #A1GDM Last BG 112 (1 hr post lunch). Check BG at admission. If normal don't need q4 hr checks. Will plan for FBG on PPD#1  #History of sciatica Had ongoing pubic/pelvic pain in this pregnancy. Has MRI in 01/2021 showing L5-S1 disk protrusion without impingement. Referred to PT in 02/2021 and has been going. Will inform anesthesia regarding findings for epidural planning  Renard Matter, MD, MPH OB  Fellow, Faculty Practice

## 2021-04-03 NOTE — Progress Notes (Signed)
MFM Note  Carol Rangel was seen for a follow up growth scan due to diet-controlled gestational diabetes.  The patient reports that she fell in the bathroom yesterday but did not hit her belly.  She has felt fetal movements throughout the day.  The patient has been feeling frequent painful contractions every 7 to 8 minutes.  These contractions are taking her breath away.  She was informed that the fetal growth and amniotic fluid level appears appropriate for her gestational age.  The overall EFW measures 6 pounds 13 ounces today.    Vigorous fetal movements were noted throughout today's exam.  Due to the frequent contractions, the patient was sent to the MAU following today's ultrasound exam for a labor check.    Due to gestational diabetes, she is already scheduled for an induction on April 13, 2021.   A total of 20 minutes was spent counseling and coordinating the care for this patient.  Greater than 50% of the time was spent in direct face-to-face contact.

## 2021-04-03 NOTE — Progress Notes (Signed)
Evaluated patient at bedside- has been almost an hour since epidural infusion stopped. Pt is feeling her contractions, able to move all four extremities but still minimally communicative with staff- barely answering questions, or answering with vague language. States she still has blurry vision and dizziness with a lot of coaxing. Vital signs have been completely normal throughout the labor and epidural placement. Pupils are equal, round and reactive to light. Per nursing and faculty practice, about 1.5h ago pt was not oriented to place- stating she was in her living room. I do perceive some language barrier but pt does not want to use interpretor service.   Of note, pt fell onto her left side in her bathroom last night, husband states she did not hit her head.   I communicated with Dr. Annia Friendly that the epidural infusion has been off for about an hour now and her motor and sensory is completely in tact, so it would be difficult to attribute her mental status changes to the epidural any longer. Recommend pursuing other causes of AMS.  Will keep epidural infusion off at this point.

## 2021-04-03 NOTE — Progress Notes (Signed)
Vtx by BSUS 

## 2021-04-03 NOTE — MAU Note (Signed)
Had an [place] at MFM (fell yesterday- every thing good on [place]), was contracting, so sent in for ctxs.  Now q . No bleeding or leaking. Has not been checked.

## 2021-04-03 NOTE — Anesthesia Procedure Notes (Signed)
Epidural Patient location during procedure: OB Start time: 04/03/2021 8:38 PM End time: 04/03/2021 8:48 PM  Staffing Anesthesiologist: Lannie Fields, DO Performed: anesthesiologist   Preanesthetic Checklist Completed: patient identified, IV checked, risks and benefits discussed, monitors and equipment checked, pre-op evaluation and timeout performed  Epidural Patient position: sitting Prep: DuraPrep and site prepped and draped Patient monitoring: continuous pulse ox, blood pressure, heart rate and cardiac monitor Approach: midline Location: L2-L3 Injection technique: LOR air  Needle:  Needle type: Tuohy  Needle gauge: 17 G Needle length: 9 cm Needle insertion depth: 5 cm Catheter type: closed end flexible Catheter size: 19 Gauge Catheter at skin depth: 10 cm Test dose: negative  Assessment Sensory level: T8 Events: blood not aspirated, injection not painful, no injection resistance, no paresthesia and negative IV test  Additional Notes Pt still uncomfortable 1h after epidural placement, replaced one level above uneventfully. Reason for block:procedure for pain

## 2021-04-03 NOTE — Anesthesia Procedure Notes (Signed)
Epidural Patient location during procedure: OB Start time: 04/03/2021 7:21 PM End time: 04/03/2021 7:32 PM  Staffing Anesthesiologist: Lannie Fields, DO Performed: anesthesiologist   Preanesthetic Checklist Completed: patient identified, IV checked, risks and benefits discussed, monitors and equipment checked, pre-op evaluation and timeout performed  Epidural Patient position: sitting Prep: DuraPrep and site prepped and draped Patient monitoring: continuous pulse ox, blood pressure, heart rate and cardiac monitor Approach: midline Location: L3-L4 Injection technique: LOR air  Needle:  Needle type: Tuohy  Needle gauge: 17 G Needle length: 9 cm Needle insertion depth: 4 cm Catheter type: closed end flexible Catheter size: 19 Gauge Catheter at skin depth: 9 cm Test dose: negative  Assessment Sensory level: T8 Events: blood not aspirated, injection not painful, no injection resistance, no paresthesia and negative IV test  Additional Notes Patient identified. Risks/Benefits/Options discussed with patient including but not limited to bleeding, infection, nerve damage, paralysis, failed block, incomplete pain control, headache, blood pressure changes, nausea, vomiting, reactions to medication both or allergic, itching and postpartum back pain. Confirmed with bedside nurse the patient's most recent platelet count. Confirmed with patient that they are not currently taking any anticoagulation, have any bleeding history or any family history of bleeding disorders. Patient expressed understanding and wished to proceed. All questions were answered. Sterile technique was used throughout the entire procedure. Please see nursing notes for vital signs. Test dose was given through epidural catheter and negative prior to continuing to dose epidural or start infusion. Warning signs of high block given to the patient including shortness of breath, tingling/numbness in hands, complete motor block,  or any concerning symptoms with instructions to call for help. Patient was given instructions on fall risk and not to get out of bed. All questions and concerns addressed with instructions to call with any issues or inadequate analgesia.  Reason for block:procedure for pain

## 2021-04-03 NOTE — Progress Notes (Signed)
Labor Interim Progress Note:   Patient had an epidural placed around 1915 this evening, however with several pushes was not having adequate relief. She was then re-dosed without success and had a second epidural placed. During the second placement, patient endorsed new onset bilateral arm/shoulder/back numbness, dizziness, and blurring in her vision. Anesthesia was aware and cut off her epidural when this did not improve. She earlier had felt confused, but recently A&Ox3.    Discussed with Dr. Salvadore Farber at [phone] after epidural was turned off for about 35 minutes that continued symptoms may need to be evaluated further aside from an anesthesia perspective.   Evaluated patient at bedside. She reports feeling overwhelmed and scared due to these symptoms above. She now only feels lightheaded with some blurring to her vision, denies any chest pain/pressure, SOB, numbness in her arms/back/shoulders, or HA. After discussion and reassurance, she continued to perk up and reports she is feeling better and that symptoms have been slowly improving since the infusion was discontinued.   O: Blood pressure 122/72, pulse 68, temperature 98.2 F (36.8 C), temperature source Oral, resp. rate 16, weight 73.1 kg, last menstrual period 07/14/2020, SpO2 97 %, not currently breastfeeding.  Gen: NAD, intermittently tearful  Cardiac: normal HR  Pulm: Breathing comfortably Neuro: Alert and oriented x3. CN 2-12 intact. Able to follow complex commands without difficulty. Speech easily understandable. No pronator drift. Able to move all extremities spontaneously. Sensation to light touch intact in UE, still some slight numbness to b/l lower extremity.    A/p: Symptoms fortunately improving s/p discontinuation of epidural, suspect some of her clinical picture was in the setting of fearfulness/overwhelming worry that has improved. Still with some lightheadedness but neurologically intact on exam. Given water and broth/crackers. BP  WNL. FHT Cat I. Monitor closely. Will go ahead and collect baseline labs, but minimal suspicion this is contributing. Dr. Jolayne Panther made aware.   Allayne Stack, DO

## 2021-04-04 ENCOUNTER — Other Ambulatory Visit: Payer: Self-pay | Admitting: Advanced Practice Midwife

## 2021-04-04 ENCOUNTER — Encounter (HOSPITAL_COMMUNITY): Payer: Self-pay | Admitting: Family Medicine

## 2021-04-04 DIAGNOSIS — Z3A37 37 weeks gestation of pregnancy: Secondary | ICD-10-CM

## 2021-04-04 DIAGNOSIS — O24420 Gestational diabetes mellitus in childbirth, diet controlled: Secondary | ICD-10-CM

## 2021-04-04 LAB — RPR: RPR Ser Ql: NONREACTIVE

## 2021-04-04 MED ORDER — SIMETHICONE 80 MG PO CHEW: 80.0000 mg | CHEWABLE_TABLET | ORAL | Status: DC | PRN

## 2021-04-04 MED ORDER — DIPHENHYDRAMINE HCL 25 MG PO CAPS: 25.0000 mg | ORAL_CAPSULE | Freq: Four times a day (QID) | ORAL | Status: DC | PRN

## 2021-04-04 MED ORDER — PRENATAL MULTIVITAMIN CH
1.0000 | ORAL_TABLET | Freq: Every day | ORAL | Status: DC
  Administered 2021-04-04 – 2021-04-05 (×2): 1 via ORAL
  Filled 2021-04-04 (×2): qty 1

## 2021-04-04 MED ORDER — MEASLES, MUMPS & RUBELLA VAC IJ SOLR: 0.5000 mL | Freq: Once | INTRAMUSCULAR | Status: DC

## 2021-04-04 MED ORDER — COCONUT OIL OIL: 1.0000 "application " | TOPICAL_OIL | Status: DC | PRN

## 2021-04-04 MED ORDER — ZOLPIDEM TARTRATE 5 MG PO TABS: 5.0000 mg | ORAL_TABLET | Freq: Every evening | ORAL | Status: DC | PRN

## 2021-04-04 MED ORDER — TRANEXAMIC ACID-NACL 1000-0.7 MG/100ML-% IV SOLN
INTRAVENOUS | Status: AC
  Administered 2021-04-04: 1000 mg
  Filled 2021-04-04: qty 100

## 2021-04-04 MED ORDER — WITCH HAZEL-GLYCERIN EX PADS: 1.0000 "application " | MEDICATED_PAD | CUTANEOUS | Status: DC | PRN

## 2021-04-04 MED ORDER — DIBUCAINE (PERIANAL) 1 % EX OINT: 1.0000 "application " | TOPICAL_OINTMENT | CUTANEOUS | Status: DC | PRN

## 2021-04-04 MED ORDER — SODIUM CHLORIDE 0.9 % IV SOLN: 250.0000 mL | INTRAVENOUS | Status: DC | PRN

## 2021-04-04 MED ORDER — SODIUM CHLORIDE 0.9% FLUSH: 3.0000 mL | INTRAVENOUS | Status: DC | PRN

## 2021-04-04 MED ORDER — SODIUM CHLORIDE 0.9% FLUSH: 3.0000 mL | Freq: Two times a day (BID) | INTRAVENOUS | Status: DC

## 2021-04-04 MED ORDER — IBUPROFEN 600 MG PO TABS
600.0000 mg | ORAL_TABLET | Freq: Four times a day (QID) | ORAL | Status: DC
  Administered 2021-04-04 – 2021-04-05 (×5): 600 mg via ORAL
  Filled 2021-04-04 (×5): qty 1

## 2021-04-04 MED ORDER — AMMONIA AROMATIC IN INHA
RESPIRATORY_TRACT | Status: AC
  Filled 2021-04-04: qty 10

## 2021-04-04 MED ORDER — FENTANYL CITRATE (PF) 100 MCG/2ML IJ SOLN
INTRAMUSCULAR | Status: AC
  Filled 2021-04-04: qty 2

## 2021-04-04 MED ORDER — ACETAMINOPHEN 325 MG PO TABS: 650.0000 mg | ORAL_TABLET | ORAL | Status: DC | PRN

## 2021-04-04 MED ORDER — TERBUTALINE SULFATE 1 MG/ML IJ SOLN: 0.2500 mg | Freq: Once | INTRAMUSCULAR | Status: DC | PRN

## 2021-04-04 MED ORDER — SENNOSIDES-DOCUSATE SODIUM 8.6-50 MG PO TABS
2.0000 | ORAL_TABLET | ORAL | Status: DC
  Administered 2021-04-05: 2 via ORAL
  Filled 2021-04-04: qty 2

## 2021-04-04 MED ORDER — OXYTOCIN-SODIUM CHLORIDE 30-0.9 UT/500ML-% IV SOLN
1.0000 m[IU]/min | INTRAVENOUS | Status: DC
  Administered 2021-04-04: 2 m[IU]/min via INTRAVENOUS

## 2021-04-04 MED ORDER — TRANEXAMIC ACID-NACL 1000-0.7 MG/100ML-% IV SOLN: 1000.0000 mg | INTRAVENOUS | Status: DC

## 2021-04-04 MED ORDER — ONDANSETRON HCL 4 MG PO TABS: 4.0000 mg | ORAL_TABLET | ORAL | Status: DC | PRN

## 2021-04-04 MED ORDER — BENZOCAINE-MENTHOL 20-0.5 % EX AERO
1.0000 "application " | INHALATION_SPRAY | CUTANEOUS | Status: DC | PRN
  Administered 2021-04-04: 1 via TOPICAL
  Filled 2021-04-04: qty 56

## 2021-04-04 MED ORDER — METHYLERGONOVINE MALEATE 0.2 MG/ML IJ SOLN: 0.2000 mg | Freq: Once | INTRAMUSCULAR | Status: AC

## 2021-04-04 MED ORDER — METHYLERGONOVINE MALEATE 0.2 MG/ML IJ SOLN
INTRAMUSCULAR | Status: AC
  Administered 2021-04-04: 0.2 mg via INTRAMUSCULAR
  Filled 2021-04-04: qty 1

## 2021-04-04 MED ORDER — ONDANSETRON HCL 4 MG/2ML IJ SOLN: 4.0000 mg | INTRAMUSCULAR | Status: DC | PRN

## 2021-04-04 MED ORDER — TETANUS-DIPHTH-ACELL PERTUSSIS 5-2.5-18.5 LF-MCG/0.5 IM SUSY: 0.5000 mL | PREFILLED_SYRINGE | Freq: Once | INTRAMUSCULAR | Status: DC

## 2021-04-04 NOTE — Anesthesia Postprocedure Evaluation (Signed)
Anesthesia Post Note  Patient: Physiological scientist  Procedure(s) Performed: AN AD HOC LABOR EPIDURAL     Patient location during evaluation: Mother Baby Anesthesia Type: Epidural Level of consciousness: awake and alert and oriented Pain management: satisfactory to patient Vital Signs Assessment: post-procedure vital signs reviewed and stable Respiratory status: respiratory function stable Cardiovascular status: stable Postop Assessment: no headache, no backache, epidural receding, patient able to bend at knees, no signs of nausea or vomiting, adequate PO intake and able to ambulate Anesthetic complications: no   No notable events documented.  Last Vitals:  Vitals:   04/04/21 0819 04/04/21 1202  BP: 113/67 105/61  Pulse: 74 73  Resp: 18 18  Temp: 36.6 C 36.6 C  SpO2: 97% 99%    Last Pain:  Vitals:   04/04/21 1202  TempSrc: Oral  PainSc:    Pain Goal: Patients Stated Pain Goal: 3 (04/04/21 0815)                 Karleen Dolphin

## 2021-04-04 NOTE — Lactation Note (Signed)
This note was copied from a baby's chart. Lactation Consultation Note  Patient Name: Girl Britlee Skolnik [id number] Date: 04/04/2021 Reason for consult: Initial assessment;Early term 37-38.6wks;Other (Comment) (baby in the crib waking up, and mom resting in bed. LC had [place] interpreter on the line - and mom mentioned she didn't need the interpreter. ( Win [place] [phone]).  per mom for now wants to formula feed and will call if she wants to BF) Age:38 hours  Maternal Data    Feeding Mother's Current Feeding Choice: Formula Nipple Type: Slow - flow  LATCH Score                    Lactation Tools Discussed/Used    Interventions Interventions: [facility] brochure  Discharge    Consult Status Consult Status: Complete Date: 04/04/21    Kathrin Greathouse 04/04/2021, 8:35 AM

## 2021-04-04 NOTE — Lactation Note (Signed)
This note was copied from a baby's chart. Lactation Consultation Note Attempted to see mom. Dr. Jerilynn Som working on her. Mom will be f/u on MBU.  Patient Name: Carol Rangel [id number] Date: 04/04/2021   Age:38 hours  Maternal Data    Feeding    LATCH Score                    Lactation Tools Discussed/Used    Interventions    Discharge    Consult Status      Charyl Dancer 04/04/2021, 5:14 AM

## 2021-04-04 NOTE — Discharge Summary (Signed)
Postpartum Discharge Summary  Date of Service updated 04/05/20     Patient Name: Carol Rangel DOB: 1985-12-15 MRN: [id number]  Date of admission: 04/03/2021 Delivery date:04/04/2021  Delivering provider: Patriciaann Clan  Date of discharge: 04/05/2021  Admitting diagnosis: Gestational diabetes [O24.419] Intrauterine pregnancy: [redacted]w[redacted]d    Secondary diagnosis:  Principal Problem:   Gestational diabetes Active Problems:   Language barrier affecting health care   Supervision of other high risk pregnancy, antepartum   AMA (advanced maternal age) multigravida 35+   Anemia  Additional problems: none    Discharge diagnosis: Term Pregnancy Delivered and GDM A1                                              Post partum procedures: none Augmentation: Pitocin Complications: None  Hospital course: Onset of Labor With Vaginal Delivery      36y.o. yo [id number] 38w5das admitted in Active Labor on 04/03/2021. Patient had an uncomplicated labor course as follows:  Membrane Rupture Time/Date: 4:08 AM ,04/04/2021   Delivery Method:Vaginal, Spontaneous  Episiotomy: None  Lacerations:  Labial;1st degree;Perineal  Patient had an uncomplicated postpartum course.  She is ambulating, tolerating a regular diet, passing flatus, and urinating well. Patient is discharged home in stable condition on 04/05/21.  Newborn Data: Birth date:04/04/2021  Birth time:4:26 AM  Gender:Female  Living status:Living  Apgars:8 ,9  Weight:3000 g   Magnesium Sulfate received: No BMZ received: No Rhophylac:No MMR:No T-DaP:Given prenatally Flu: No Transfusion:No  Physical exam  Vitals:   04/04/21 1522 04/04/21 1958 04/05/21 0009 04/05/21 0745  BP: 108/65 113/71 (!) 97/53 101/63  Pulse: 74 87 72 (!) 55  Resp: _0 Temp: 98 F (36.7 C) 98.2 F (36.8 C) 98.1 F (36.7 C) 97.7 F (36.5 C)  TempSrc: Oral Oral Oral Oral  SpO2: 100% 98% 98% 96%  Weight:      Height:       General: alert, cooperative,  and no distress Lochia: appropriate Uterine Fundus: firm Incision: N/A DVT Evaluation: No evidence of DVT seen on physical exam. Labs: Lab Results  Component Value Date   WBC 9.3 04/03/2021   HGB 11.2 (L) 04/03/2021   HCT 35.5 (L) 04/03/2021   MCV 91.5 04/03/2021   PLT 157 04/03/2021   CMP Latest Ref Rng & Units 01/23/2021  Glucose 70 - 99 mg/dL 88  BUN 6 - 20 mg/dL 9  Creatinine 0.44 - 1.00 mg/dL 0.41(L)  Sodium 135 - 145 mmol/L 134(L)  Potassium 3.5 - 5.1 mmol/L 3.8  Chloride 98 - 111 mmol/L 104  CO2 22 - 32 mmol/L 22  Calcium 8.9 - 10.3 mg/dL 9.2  Total Protein 6.5 - 8.1 g/dL 6.6  Total Bilirubin 0.3 - 1.2 mg/dL 0.5  Alkaline Phos 38 - 126 U/L 51  AST 15 - 41 U/L 16  ALT 0 - 44 U/L 9   Edinburgh Score: Edinburgh Postnatal Depression Scale Screening Tool 04/04/2021  I have been able to laugh and see the funny side of things. 0  I have looked forward with enjoyment to things. 0  I have blamed myself unnecessarily when things went wrong. 1  I have been anxious or worried for no good reason. 0  I have felt scared or panicky for no good reason. 0  Things have been getting on top of  me. 0  I have been so unhappy that I have had difficulty sleeping. 0  I have felt sad or miserable. 0  I have been so unhappy that I have been crying. 0  The thought of harming myself has occurred to me. 0  Edinburgh Postnatal Depression Scale Total 1     After visit meds:  Allergies as of 04/05/2021   No Known Allergies      Medication List     STOP taking these medications    Accu-Chek Guide test strip Generic drug: glucose blood   Accu-Chek Softclix Lancets lancets   oxyCODONE-acetaminophen 5-325 MG tablet Commonly known as: Percocet       TAKE these medications    acetaminophen 325 MG tablet Commonly known as: Tylenol Take 2 tablets (650 mg total) by mouth every 4 (four) hours as needed (for pain scale < 4).   ibuprofen 600 MG tablet Commonly known as: ADVIL Take 1  tablet (600 mg total) by mouth every 6 (six) hours as needed.   multivitamin-prenatal 27-0.8 MG Tabs tablet Take 1 tablet by mouth daily at 12 noon.         Discharge home in stable condition Infant Feeding: Breast Infant Disposition:home with mother Discharge instruction: per After Visit Summary and Postpartum booklet. Activity: Advance as tolerated. Pelvic rest for 6 weeks.  Diet: routine diet Future Appointments: Future Appointments  Date Time Provider [place]  05/17/2021  8:20 AM [facility]  05/17/2021  9:55 AM Darlina Rumpf, CNM [facility]   Follow up Visit:  [place] [facility]. Schedule an appointment as soon as possible for a visit in 5 week(s).   Specialty: Obstetrics and Gynecology Contact information: [place] [phone]                Message sent to [facility] by Dr Higinio Plan  Please schedule this patient for a In person postpartum visit in 6 weeks with the following provider: Any provider. Additional Postpartum F/U:2 hour GTT  High risk pregnancy complicated by: GDM Delivery mode:  Vaginal, Spontaneous  Anticipated Birth Control:  Unsure   04/08/2021 Annalee Genta, DO

## 2021-04-04 NOTE — Progress Notes (Signed)
Carol Rangel is a 36 y.o. G2P1001 at [redacted]w[redacted]d by LMP admitted for active labor.  Subjective: Improved in regards to previous numbness, nausea, and blurry vision. Continues to have mild dizziness that has improved from prior. States that she is feeling CTX more at this time. Discussed with patient that epidural is no longer an option per Anesthesia recommendations in the setting of patient's symptoms. Discussed ongoing plan with patient for augmentation of labor, and she is in agreement.   Objective: BP (!) 101/55    Pulse 79    Temp 98.1 F (36.7 C) (Oral)    Resp 16    Wt 73.1 kg    LMP 07/14/2020    SpO2 99%    BMI 26.83 kg/m  No intake/output data recorded. No intake/output data recorded.  FHT:  FHR: 110 bpm, variability: moderate,  accelerations:  Present,  decelerations:  Absent UC:   regular, every 3-5 minutes SVE:   Dilation: 5.5 Effacement (%): 70 Station: -3 Exam by:: Higinio Plan, MD  Labs: Lab Results  Component Value Date   WBC 9.3 04/03/2021   HGB 11.2 (L) 04/03/2021   HCT 35.5 (L) 04/03/2021   MCV 91.5 04/03/2021   PLT 157 04/03/2021    Assessment / Plan: Spontaneous labor, progressing normally.   Labor: Progressing normally, will start pitocin at this time to continue with labor augmentation Fetal Wellbeing:  Category I Pain Control:  IV pain meds and Nitrous Oxide, epidural no longer an option at this time I/D:  n/a Anticipated MOD:  NSVD  Carol Rangel 04/04/2021, 12:28 AM

## 2021-04-05 ENCOUNTER — Encounter: Payer: Medicaid Other | Admitting: Family Medicine

## 2021-04-05 LAB — GLUCOSE, CAPILLARY: Glucose-Capillary: 92 mg/dL (ref 70–99)

## 2021-04-05 MED ORDER — IBUPROFEN 600 MG PO TABS: 600.0000 mg | ORAL_TABLET | Freq: Four times a day (QID) | ORAL | 0 refills | Status: DC | PRN

## 2021-04-05 MED ORDER — ACETAMINOPHEN 325 MG PO TABS: 650.0000 mg | ORAL_TABLET | ORAL | Status: DC | PRN

## 2021-04-05 NOTE — Plan of Care (Signed)
Problem: Coping: Goal: Level of anxiety will decrease Outcome: Completed/Met   Problem: Elimination: Goal: Will not experience complications related to bowel motility Outcome: Completed/Met Goal: Will not experience complications related to urinary retention Outcome: Completed/Met   Problem: Coping: Goal: Ability to identify and utilize available resources and services will improve Outcome: Completed/Met   Problem: Life Cycle: Goal: Chance of risk for complications during the postpartum period will decrease Outcome: Completed/Met   Problem: Role Relationship: Goal: Ability to demonstrate positive interaction with newborn will improve Outcome: Completed/Met

## 2021-04-05 NOTE — Plan of Care (Signed)
°  Problem: Education: Goal: Knowledge of condition will improve Outcome: Adequate for Discharge Goal: Individualized Educational Video(s) Outcome: Adequate for Discharge Goal: Individualized Newborn Educational Video(s) Outcome: Adequate for Discharge   

## 2021-04-05 NOTE — Progress Notes (Signed)
POSTPARTUM PROGRESS NOTE  PPD #1  Subjective:  Carol Rangel is a 36 y.o. [id number] s/p NSVD at [redacted]w[redacted]d. Today she notes no acute complaints. She denies any problems with ambulating, voiding or po intake. Denies nausea or vomiting. She has passed flatus, +BM.  Pain is well controlled.  Lochia minimal Denies fever/chills/chest pain/SOB.  no HA, no blurry vision, no RUQ pain  Objective: Blood pressure 101/63, pulse (!) 55, temperature 97.7 F (36.5 C), temperature source Oral, resp. rate 18, height 5\' 5"  (1.651 m), weight 73.1 kg, last menstrual period 07/14/2020, SpO2 96 %, unknown if currently breastfeeding.  Physical Exam:  General: alert, cooperative and no distress Chest: no respiratory distress Heart: regular rate and rhythm Abdomen: soft, nontender Uterine Fundus: firm, appropriately tender DVT Evaluation: No calf swelling or tenderness Extremities: no edema Skin: warm, dry  Results for orders placed or performed during the hospital encounter of 04/03/21 (from the past 24 hour(s))  Glucose, capillary     Status: None   Collection Time: 04/05/21  5:11 AM  Result Value Ref Range   Glucose-Capillary 92 70 - 99 mg/dL    Assessment/Plan: Sheera Illingworth is a 36 y.o. 30 s/p NSVD at [redacted]w[redacted]d PPD#1  -pain well controlled -may consider early discharge home pending baby status  Contraception: unsure, may consider Nexplanon Feeding: breast/bottle    LOS: 2 days   [redacted]w[redacted]d, DO Faculty Attending, Center for [facility] 04/05/2021, 10:01 AM

## 2021-04-08 ENCOUNTER — Telehealth: Payer: Self-pay | Admitting: Radiology

## 2021-04-08 NOTE — Telephone Encounter (Signed)
Called patient via interpreter and informed her that PP 2 hr GTT was added to appointment on 05/17/21 and to arrive at 8:20 fasting. Patient expressed understanding.

## 2021-04-13 ENCOUNTER — Inpatient Hospital Stay (HOSPITAL_COMMUNITY)
Admission: AD | Admit: 2021-04-13 | Payer: Medicaid Other | Source: Home / Self Care | Admitting: Obstetrics and Gynecology

## 2021-04-13 ENCOUNTER — Inpatient Hospital Stay (HOSPITAL_COMMUNITY): Payer: Medicaid Other

## 2021-04-17 ENCOUNTER — Telehealth (HOSPITAL_COMMUNITY): Payer: Self-pay | Admitting: *Deleted

## 2021-04-17 NOTE — Telephone Encounter (Signed)
Attempted Hospital Discharge Follow-Up Call with help of telephonic interpreter "[facility]".  No answer.  Unable to leave a message.

## 2021-05-17 ENCOUNTER — Ambulatory Visit (INDEPENDENT_AMBULATORY_CARE_PROVIDER_SITE_OTHER): Payer: Medicaid Other | Admitting: Advanced Practice Midwife

## 2021-05-17 ENCOUNTER — Other Ambulatory Visit (INDEPENDENT_AMBULATORY_CARE_PROVIDER_SITE_OTHER): Payer: Medicaid Other

## 2021-05-17 ENCOUNTER — Other Ambulatory Visit: Payer: Self-pay

## 2021-05-17 DIAGNOSIS — Z30017 Encounter for initial prescription of implantable subdermal contraceptive: Secondary | ICD-10-CM | POA: Diagnosis not present

## 2021-05-17 DIAGNOSIS — Z8632 Personal history of gestational diabetes: Secondary | ICD-10-CM

## 2021-05-17 DIAGNOSIS — Z3009 Encounter for other general counseling and advice on contraception: Secondary | ICD-10-CM

## 2021-05-17 LAB — POCT PREGNANCY, URINE
Preg Test, Ur: NEGATIVE
Preg Test, Ur: NEGATIVE

## 2021-05-17 MED ORDER — ETONOGESTREL 68 MG ~~LOC~~ IMPL
68.0000 mg | DRUG_IMPLANT | Freq: Once | SUBCUTANEOUS | Status: AC
  Administered 2021-05-17: 68 mg via SUBCUTANEOUS

## 2021-05-17 NOTE — Progress Notes (Signed)
? ? ? ?  GYNECOLOGY OFFICE PROCEDURE NOTE ? ?Carol Rangel is a 36 y.o. [id number] here for Nexplanon insertion.  Last pap smear was on 08/26/2019 and was normal.  No other gynecologic concerns. ? ?Nexplanon Insertion Procedure ?Patient identified, informed consent performed, consent signed.   Patient does understand that irregular bleeding is a very common side effect of this medication. She was advised to have backup contraception for one week after placement. Pregnancy test in clinic today was negative.  Appropriate time out taken.  Patient's left arm was prepped and draped in the usual sterile fashion. The ruler used to measure and mark insertion area.  Patient was prepped with alcohol swab and then injected with 3 ml of 1% lidocaine.  She was prepped with betadine, Nexplanon removed from packaging,  Device confirmed in needle, then inserted full length of needle and withdrawn per handbook instructions. Nexplanon was able to palpated in the patient's arm; patient palpated the insert herself. There was minimal blood loss.  Patient insertion site covered with guaze and a pressure bandage to reduce any bruising.  The patient tolerated the procedure well and was given post procedure instructions.  ? ? ?Clayton Bibles, MSA, MSN, CNM ?Certified Nurse Midwife, Faculty Practice ?Center for [employer], [facility] ? ? ?

## 2021-05-17 NOTE — Progress Notes (Signed)
? ? ?  Post Partum Visit Note ? ?Carol Rangel is a 36 y.o. G49P2002 female who presents for a postpartum visit. She is 6 weeks postpartum following a normal spontaneous vaginal delivery.  I have fully reviewed the prenatal and intrapartum course. The delivery was at [redacted]w[redacted]d gestational weeks.  Anesthesia:  Epidural and lidocaine . Postpartum course has been uncomplicated. Baby is doing well. Baby is feeding by bottle Rush Barer . Bleeding no bleeding. Bowel function is normal. Bladder function is normal. Patient is not sexually active. Contraception method is none. Postpartum depression screening: negative. ? ? ?The pregnancy intention screening data noted above was reviewed. Potential methods of contraception were discussed. The patient elected to proceed with Nexplanon. ? ? ?Health Maintenance Due  ?Topic Date Due  ? URINE MICROALBUMIN  Never done  ? ? ?The following portions of the patient's history were reviewed and updated as appropriate: allergies, current medications, past family history, past medical history, past social history, past surgical history, and problem list. ? ?Review of Systems ?Pertinent items are noted in HPI. ? ?Objective:  ?LMP 07/14/2020   ? ?General:  alert, cooperative, appears stated age, and no distress  ? Breasts:  Declined by patient  ?Lungs: Normal work of breathing  ?Heart:  regular rate and rhythm, S1, S2 normal, no murmur, click, rub or gallop  ?Abdomen: soft, non-tender; bowel sounds normal; no masses,  no organomegaly   ?GU exam:  not indicated  ?     ?Assessment:  ? ?1. Postpartum care and examination ?--Normal exam ? ?2. Contraception counseling ?--Reviewed all forms of birth control options available including abstinence; fertility period awareness methods; over the counter/barrier methods; hormonal contraceptive medication including pill, patch, ring, injection,contraceptive implant; hormonal and nonhormonal IUDs; permanent sterilization options not reviewed due to stated patient  preference. Risks and benefits reviewed.  Questions were answered.  [employer] utilized for visual aids during patient discussion ? ?3. Nexplanon insertion ?--See separate insertion note ? ?Plan:  ? ?Essential components of care per ACOG recommendations: ? ?1.  Mood and well being: Patient with negative depression screening today. Reviewed local resources for support.  ?- Patient tobacco use? No.   ?- hx of drug use? No.   ? ?2. Infant care and feeding:  ?-Patient currently breastmilk feeding? No.  ? ? ?3. Sexuality, contraception and birth spacing ?- Patient does not want a pregnancy in the next year.    ?- Reviewed reproductive life planning. Reviewed contraceptive methods based on pt preferences and effectiveness.  Patient desired  Nexplanon  today.   ? ? ?4. Sleep and fatigue ?-Encouraged family/partner/community support of 4 hrs of uninterrupted sleep to help with mood and fatigue ? ?5. Physical Recovery  ?- Discussed patients delivery and complications. She describes her labor as good. ?- Patient had a Vaginal, no problems at delivery. Patient had a Right Vaginal wall with labial extension and left 1st degree perineal  laceration. Perineal healing reviewed. Patient expressed understanding ?- Patient has urinary incontinence? No. ?- Patient is safe to resume physical and sexual activity ? ?6.  Health Maintenance ?- Last pap smear  ?Diagnosis  ?Date Value Ref Range Status  ?08/26/2019   Final  ? - Negative for intraepithelial lesion or malignancy (NILM)  ? Pap smear not done at today's visit.  ? ?7. Chronic Disease/Pregnancy Condition follow up: Postpartum GTT collected today ? ?Clayton Bibles, MSA, MSN, CNM ?Certified Nurse Midwife, Faculty Practice ?Center for [employer], [facility] ?05/17/21 ?10:33 AM ? ?

## 2021-05-18 ENCOUNTER — Telehealth: Payer: Self-pay

## 2021-05-18 LAB — GLUCOSE TOLERANCE, 2 HOURS
Glucose, 2 hour: 146 mg/dL — ABNORMAL HIGH (ref 70–139)
Glucose, GTT - Fasting: 95 mg/dL (ref 70–99)

## 2021-05-18 NOTE — Telephone Encounter (Signed)
-----   Message from Samantha C Weinhold, CNM sent at 05/18/2021  8:51 AM EST ----- ?Elevated 2 hour GTT at 6 weeks postpartum s/p pregnancy c/b A1GDM. Can you please confirm she has PCP and follow up with them or give referral to [facility]? Thanks! Sam, CNM ?

## 2021-05-18 NOTE — Telephone Encounter (Signed)
-----   Message from Darlina Rumpf, [place] sent at 05/18/2021  8:51 AM EST ----- ?Elevated 2 hour GTT at 6 weeks postpartum s/p pregnancy c/b A1GDM. Can you please confirm she has PCP and follow up with them or give referral to [facility]? Thanks! Sam, CNM ?

## 2021-05-18 NOTE — Telephone Encounter (Signed)
I called patient using [place] ID [phone] and reviewed her PP 2 hour GTT results with her and that we recommend she follow up with her PCP. Per patient she does not have a PCP established. Appointment set up at the [facility] on 05/23/21 at 1 PM to establish PCP. I reviewed this with patient. Patient verbalized understanding.  ? ? ?Paulina Fusi, RN ?05/18/21 ?

## 2021-05-23 ENCOUNTER — Other Ambulatory Visit: Payer: Self-pay

## 2021-05-23 ENCOUNTER — Encounter: Payer: Self-pay | Admitting: Nurse Practitioner

## 2021-05-23 ENCOUNTER — Ambulatory Visit (INDEPENDENT_AMBULATORY_CARE_PROVIDER_SITE_OTHER): Payer: Medicaid Other | Admitting: Nurse Practitioner

## 2021-05-23 VITALS — BP 114/77 | HR 72 | Temp 99.1°F | Ht 65.0 in | Wt 153.2 lb

## 2021-05-23 DIAGNOSIS — Z0000 Encounter for general adult medical examination without abnormal findings: Secondary | ICD-10-CM | POA: Diagnosis not present

## 2021-05-23 LAB — POCT GLYCOSYLATED HEMOGLOBIN (HGB A1C)
HbA1c POC (<> result, manual entry): 5.4 % (ref 4.0–5.6)
HbA1c, POC (controlled diabetic range): 5.4 % (ref 0.0–7.0)
HbA1c, POC (prediabetic range): 5.4 % — AB (ref 5.7–6.4)
Hemoglobin A1C: 5.4 % (ref 4.0–5.6)

## 2021-05-23 LAB — GLUCOSE, POCT (MANUAL RESULT ENTRY): POC Glucose: 99 mg/dl (ref 70–99)

## 2021-05-23 NOTE — Patient Instructions (Signed)
You were seen today in the [facility] for wellness visit and reevaluation of gestational diabetes. Labs were collected, results will be available via MyChart or, if abnormal, you will be contacted by clinic staff.  Please follow up in 1 yr for  wellness visit. ?

## 2021-05-23 NOTE — Progress Notes (Signed)
Carol Rangel, Carol Rangel Phone:  [phone]   Fax:  [phone] Subjective:   Patient ID: Carol Rangel, female    DOB: [birth year], 36 y.o.   MRN: [id number]  Chief Complaint  Patient presents with   Establish Care    Patient is here today to establish care and to discuss her blood sugar levels since she gave birth on 04/04/21. Patient states that while she was pregnant her blood sugar levels were elevated.   HPI Carol Rangel 36 y.o. female  has a past medical history of Gestational diabetes, Medical history non-contributory, Nausea with vomiting (12/20/2013), and Sciatica of right side (09/24/2019). To the [facility] to establish care. Patient states that while pregnant, she was diagnosed and treated for gestational diabetes. Had uncomplicated vaginal delivery on 04/04/21.   Denies any other concerns today. Denies monitoring meals and/ adhering to exercise regimen. States that her pelvic bone was broken prior to delivery, PT was recommended. Patient states that she has been completing ROM at home and is restrictions related to injury are slowly improving. Denies working, currently resides with partner and new baby.   Denies any fever. Denies any fatigue, chest pain, shortness of breath, HA or dizziness. Denies any blurred vision, numbness or tingling.  Past Medical History:  Diagnosis Date   Gestational diabetes    Medical history non-contributory    Nausea with vomiting 12/20/2013   Sciatica of right side 09/24/2019    Past Surgical History:  Procedure Laterality Date   BREAST SURGERY Right    ?abscess surgery x2    Family History  Problem Relation Age of Onset   Hypertension Mother    Stroke Father    Hypertension Father    Cancer Maternal Grandmother     Social History   Socioeconomic History   Marital status: Single    Spouse name: Not on file   Number of children: Not on file   Years of education: Not on file   Highest  education level: High school graduate  Occupational History   Occupation: unemployed  Tobacco Use   Smoking status: Never   Smokeless tobacco: Never  Vaping Use   Vaping Use: Never used  Substance and Sexual Activity   Alcohol use: No   Drug use: No   Sexual activity: Yes    Birth control/protection: None  Other Topics Concern   Not on file  Social History Narrative   Not on file   Social Determinants of Health   Financial Resource Strain: Not on file  Food Insecurity: No Food Insecurity   Worried About Running Out of Food in the Last Year: Never true   [place] in the Last Year: Never true  Transportation Needs: No Transportation Needs   Lack of Transportation (Medical): No   Lack of Transportation (Non-Medical): No  Physical Activity: Not on file  Stress: Not on file  Social Connections: Not on file  Intimate Partner Violence: Not on file    Outpatient Medications Prior to Visit  Medication Sig Dispense Refill   acetaminophen (TYLENOL) 325 MG tablet Take 2 tablets (650 mg total) by mouth every 4 (four) hours as needed (for pain scale < 4). (Patient not taking: Reported on 05/17/2021)     ibuprofen (ADVIL) 600 MG tablet Take 1 tablet (600 mg total) by mouth every 6 (six) hours as needed. (Patient not taking: Reported on 05/17/2021) 30 tablet 0   Prenatal Vit-Fe Fumarate-FA (MULTIVITAMIN-PRENATAL)  27-0.8 MG TABS tablet Take 1 tablet by mouth daily at 12 noon. (Patient not taking: Reported on 05/17/2021)     No facility-administered medications prior to visit.    No Known Allergies  Review of Systems  Constitutional:  Negative for chills, fever and malaise/fatigue.  HENT: Negative.    Eyes: Negative.   Respiratory:  Negative for cough and shortness of breath.   Cardiovascular:  Negative for chest pain, palpitations and leg swelling.  Gastrointestinal:  Negative for abdominal pain, blood in stool, constipation, diarrhea, nausea and vomiting.  Genitourinary: Negative.    Musculoskeletal: Negative.   Skin: Negative.   Neurological: Negative.   Psychiatric/Behavioral:  Negative for depression. The patient is not nervous/anxious.   All other systems reviewed and are negative.     Objective:    Physical Exam Vitals reviewed.  Constitutional:      General: She is not in acute distress.    Appearance: Normal appearance. She is normal weight.  HENT:     Head: Normocephalic.     Right Ear: Tympanic membrane, ear canal and external ear normal. There is no impacted cerumen.     Left Ear: Tympanic membrane, ear canal and external ear normal. There is no impacted cerumen.     Nose: Nose normal. No congestion or rhinorrhea.     Mouth/Throat:     Mouth: Mucous membranes are moist.     Pharynx: Oropharynx is clear. No oropharyngeal exudate or posterior oropharyngeal erythema.  Eyes:     General: No scleral icterus.       Right eye: No discharge.        Left eye: No discharge.     Extraocular Movements: Extraocular movements intact.     Conjunctiva/sclera: Conjunctivae normal.  Neck:     Vascular: No carotid bruit.  Cardiovascular:     Rate and Rhythm: Normal rate and regular rhythm.     Pulses: Normal pulses.     Heart sounds: Normal heart sounds.     Comments: No obvious peripheral edema Pulmonary:     Effort: Pulmonary effort is normal.     Breath sounds: Normal breath sounds.  Abdominal:     General: Abdomen is flat. Bowel sounds are normal. There is no distension.     Palpations: Abdomen is soft. There is no mass.     Tenderness: There is no abdominal tenderness. There is no right CVA tenderness, left CVA tenderness, guarding or rebound.     Hernia: No hernia is present.  Musculoskeletal:        General: No swelling, tenderness, deformity or signs of injury. Normal range of motion.     Cervical back: Normal range of motion and neck supple. No rigidity or tenderness.     Right lower leg: No edema.     Left lower leg: No edema.  Lymphadenopathy:      Cervical: No cervical adenopathy.  Skin:    General: Skin is warm and dry.     Capillary Refill: Capillary refill takes less than 2 seconds.  Neurological:     General: No focal deficit present.     Mental Status: She is alert and oriented to person, place, and time.  Psychiatric:        Mood and Affect: Mood normal.        Behavior: Behavior normal.        Thought Content: Thought content normal.        Judgment: Judgment normal.    BP 114/77  Pulse 72    Temp 99.1 F (37.3 C)    Ht 5\' 5"  (1.651 m)    Wt 153 lb 3.2 oz (69.5 kg)    LMP 07/14/2020    SpO2 98%    Breastfeeding No    BMI 25.49 kg/m  Wt Readings from Last 3 Encounters:  05/23/21 153 lb 3.2 oz (69.5 kg)  05/17/21 152 lb (68.9 kg)  04/03/21 161 lb 2.5 oz (73.1 kg)    Immunization History  Administered Date(s) Administered   Tdap 10/28/2019, 01/31/2021    Diabetic Foot Exam - Simple   No data filed     Lab Results  Component Value Date   TSH 0.787 12/18/2013   Lab Results  Component Value Date   WBC 9.3 04/03/2021   HGB 11.2 (L) 04/03/2021   HCT 35.5 (L) 04/03/2021   MCV 91.5 04/03/2021   PLT 157 04/03/2021   Lab Results  Component Value Date   NA 134 (L) 01/23/2021   K 3.8 01/23/2021   CO2 22 01/23/2021   GLUCOSE 88 01/23/2021   BUN 9 01/23/2021   CREATININE 0.41 (L) 01/23/2021   BILITOT 0.5 01/23/2021   ALKPHOS 51 01/23/2021   AST 16 01/23/2021   ALT 9 01/23/2021   PROT 6.6 01/23/2021   ALBUMIN 3.2 (L) 01/23/2021   CALCIUM 9.2 01/23/2021   ANIONGAP 8 01/23/2021   No results found for: CHOL No results found for: HDL No results found for: LDLCALC No results found for: TRIG No results found for: CHOLHDL Lab Results  Component Value Date   HGBA1C 5.4 05/23/2021   HGBA1C 5.4 05/23/2021   HGBA1C 5.4 (A) 05/23/2021   HGBA1C 5.4 05/23/2021       Assessment & Plan:   Problem List Items Addressed This Visit   None Visit Diagnoses     Healthcare maintenance    -  Primary    Relevant Orders   HgB A1c (Completed):4.9   Glucose (CBG) (Completed): 92   Encounter for wellness examination in adult       Relevant Orders   CBC with Differential/Platelet   Comprehensive metabolic panel   Lipid panel Encouraged continued diet and exercise efforts Offered referral for pelvic therapy, patient declined at this time   Follow up in 1 yr for wellness exam, sooner as needed    I have discontinued Sergio Boeh's multivitamin-prenatal, ibuprofen, and acetaminophen.  No orders of the defined types were placed in this encounter.    Teena Dunk, NP

## 2021-05-24 LAB — LIPID PANEL
Chol/HDL Ratio: 7.3 ratio — ABNORMAL HIGH (ref 0.0–4.4)
Cholesterol, Total: 277 mg/dL — ABNORMAL HIGH (ref 100–199)
HDL: 38 mg/dL — ABNORMAL LOW (ref >39)
LDL Chol Calc (NIH): 193 mg/dL — ABNORMAL HIGH (ref 0–99)
Triglycerides: 237 mg/dL — ABNORMAL HIGH (ref 0–149)
VLDL Cholesterol Cal: 46 mg/dL — ABNORMAL HIGH (ref 5–40)

## 2021-05-24 LAB — COMPREHENSIVE METABOLIC PANEL
ALT: 19 IU/L (ref 0–32)
AST: 15 IU/L (ref 0–40)
Albumin/Globulin Ratio: 1.6 (ref 1.2–2.2)
Albumin: 4.9 g/dL — ABNORMAL HIGH (ref 3.8–4.8)
Alkaline Phosphatase: 80 IU/L (ref 44–121)
BUN/Creatinine Ratio: 17 (ref 9–23)
BUN: 10 mg/dL (ref 6–20)
Bilirubin Total: 0.3 mg/dL (ref 0.0–1.2)
CO2: 20 mmol/L (ref 20–29)
Calcium: 10 mg/dL (ref 8.7–10.2)
Chloride: 103 mmol/L (ref 96–106)
Creatinine, Ser: 0.58 mg/dL (ref 0.57–1.00)
Globulin, Total: 3.1 g/dL (ref 1.5–4.5)
Glucose: 96 mg/dL (ref 70–99)
Potassium: 4.1 mmol/L (ref 3.5–5.2)
Sodium: 136 mmol/L (ref 134–144)
Total Protein: 8 g/dL (ref 6.0–8.5)
eGFR: 119 mL/min/{1.73_m2} (ref >59)

## 2021-05-24 LAB — CBC WITH DIFFERENTIAL/PLATELET
Basophils Absolute: 0.1 10*3/uL (ref 0.0–0.2)
Basos: 1 %
EOS (ABSOLUTE): 0.3 10*3/uL (ref 0.0–0.4)
Eos: 4 %
Hematocrit: 39.8 % (ref 34.0–46.6)
Hemoglobin: 13.1 g/dL (ref 11.1–15.9)
Immature Grans (Abs): 0 10*3/uL (ref 0.0–0.1)
Immature Granulocytes: 0 %
Lymphocytes Absolute: 3 10*3/uL (ref 0.7–3.1)
Lymphs: 39 %
MCH: 28.5 pg (ref 26.6–33.0)
MCHC: 32.9 g/dL (ref 31.5–35.7)
MCV: 87 fL (ref 79–97)
Monocytes Absolute: 0.4 10*3/uL (ref 0.1–0.9)
Monocytes: 5 %
Neutrophils Absolute: 4 10*3/uL (ref 1.4–7.0)
Neutrophils: 51 %
Platelets: 234 10*3/uL (ref 150–450)
RBC: 4.59 x10E6/uL (ref 3.77–5.28)
RDW: 13.4 % (ref 11.7–15.4)
WBC: 7.8 10*3/uL (ref 3.4–10.8)

## 2021-05-25 ENCOUNTER — Other Ambulatory Visit: Payer: Self-pay | Admitting: Nurse Practitioner

## 2021-05-25 DIAGNOSIS — E782 Mixed hyperlipidemia: Secondary | ICD-10-CM

## 2021-05-25 MED ORDER — OMEGA-3-ACID ETHYL ESTERS 1 G PO CAPS: 2.0000 g | ORAL_CAPSULE | Freq: Every day | ORAL | 2 refills | Status: DC

## 2021-05-25 MED ORDER — ATORVASTATIN CALCIUM 40 MG PO TABS: 40.0000 mg | ORAL_TABLET | Freq: Every day | ORAL | 2 refills | Status: DC

## 2021-05-30 ENCOUNTER — Telehealth: Payer: Self-pay

## 2021-05-30 NOTE — Telephone Encounter (Signed)
No additional notes needed  

## 2021-07-12 ENCOUNTER — Other Ambulatory Visit: Payer: Self-pay | Admitting: Internal Medicine

## 2021-07-12 DIAGNOSIS — R93422 Abnormal radiologic findings on diagnostic imaging of left kidney: Secondary | ICD-10-CM

## 2021-08-07 ENCOUNTER — Ambulatory Visit
Admission: RE | Admit: 2021-08-07 | Discharge: 2021-08-07 | Disposition: A | Discharge status: Home / Self Care | Payer: Medicaid Other | Source: Ambulatory Visit | Attending: Internal Medicine | Admitting: Internal Medicine

## 2021-08-07 DIAGNOSIS — R93422 Abnormal radiologic findings on diagnostic imaging of left kidney: Secondary | ICD-10-CM

## 2021-08-07 IMAGING — CT CT ABD-PELV W/ CM
1 of 2 series · 13 of 32 positions shown, 18 images · IV contrast (APPLIED)
Comparison: Renal ultrasound [DATE].

CLINICAL DATA: Abnormal renal ultrasound.

EXAM:
CT ABDOMEN AND PELVIS WITH CONTRAST
TECHNIQUE: Multidetector CT imaging of the abdomen and pelvis was performed
using the standard protocol following bolus administration of
intravenous contrast.

[Series 2: abd/pelvis w/cm · axial · 0.72mm/px · z∈[-629,-189]mm · 13 of 102 slices shown, 18 images]
[im 7/102  soft-tissue]
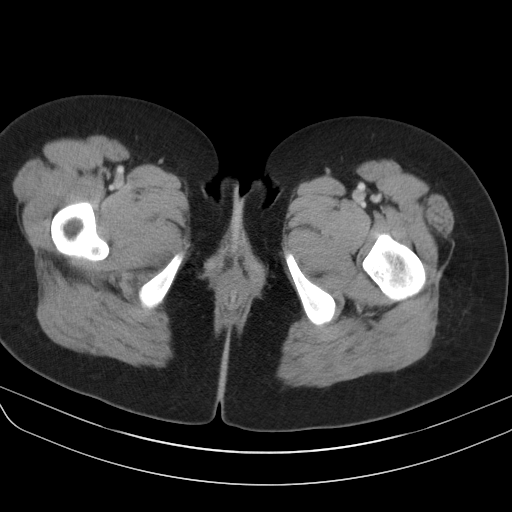
[im 7/102  bone]
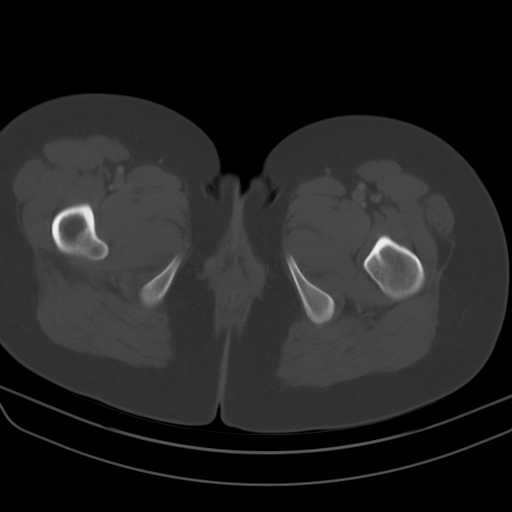
[im 13/102  soft-tissue]
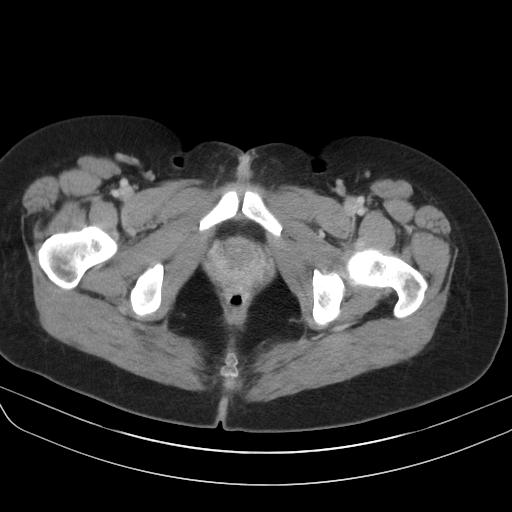
[im 26/102  soft-tissue]
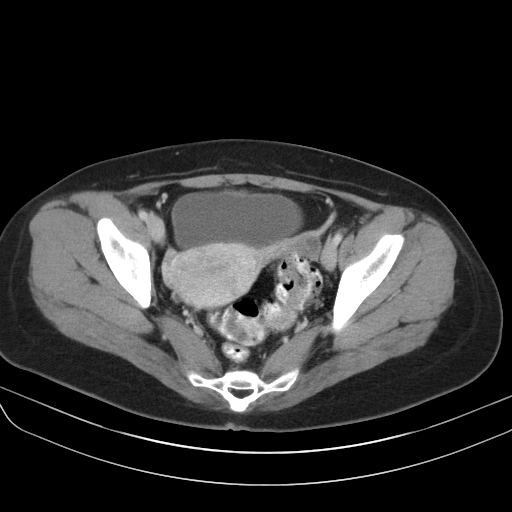
[im 32/102  soft-tissue]
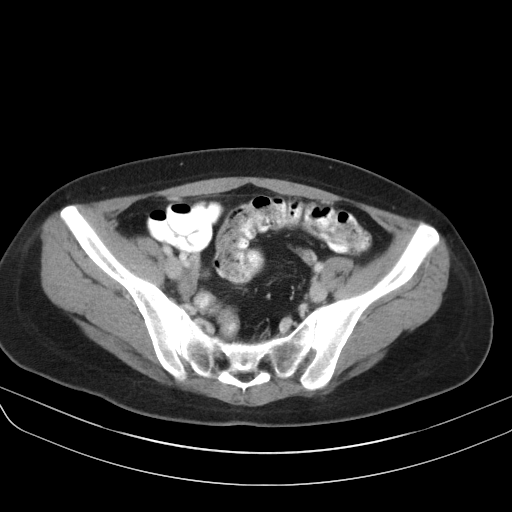
[im 38/102  soft-tissue]
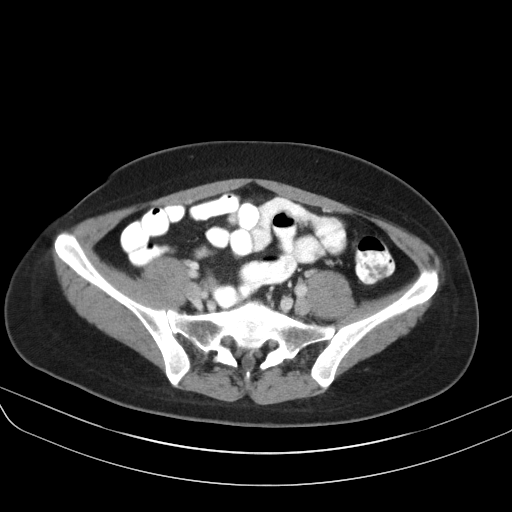
[im 45/102  soft-tissue]
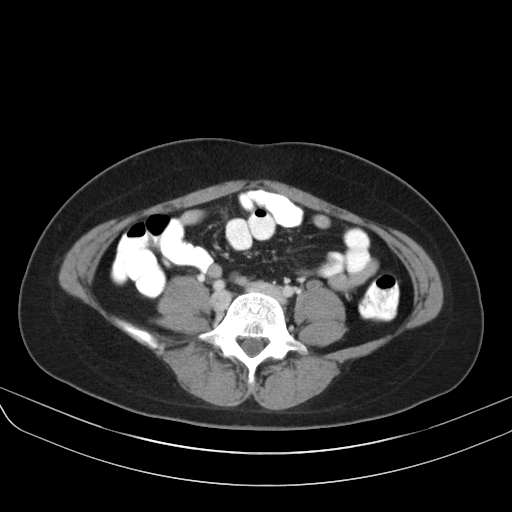
[im 57/102  soft-tissue]
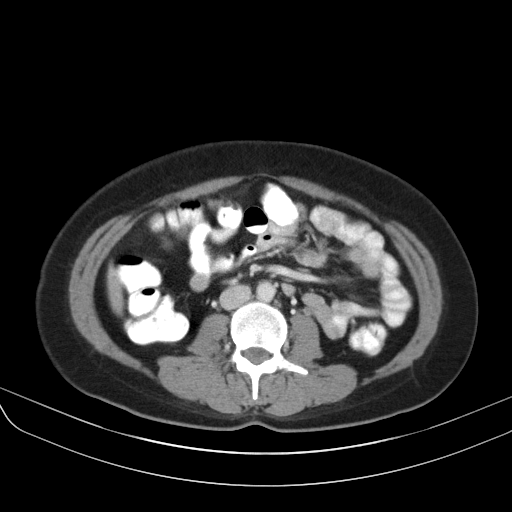
[im 64/102  soft-tissue]
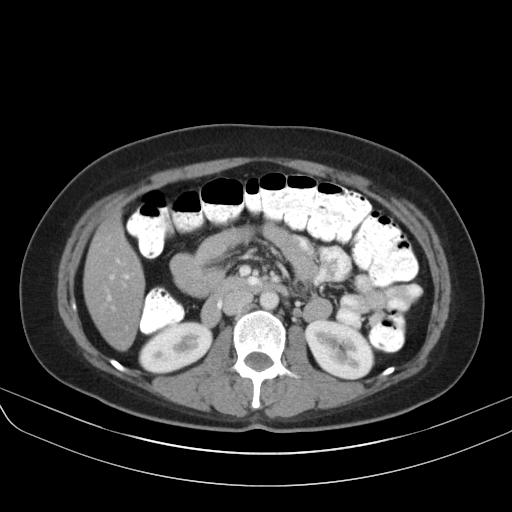
[im 70/102  soft-tissue]
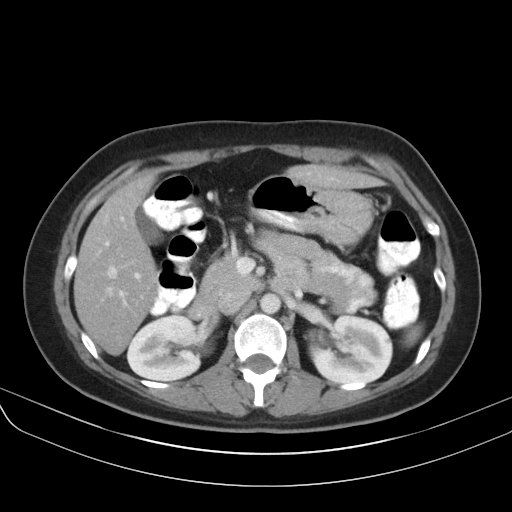
[im 70/102  bone]
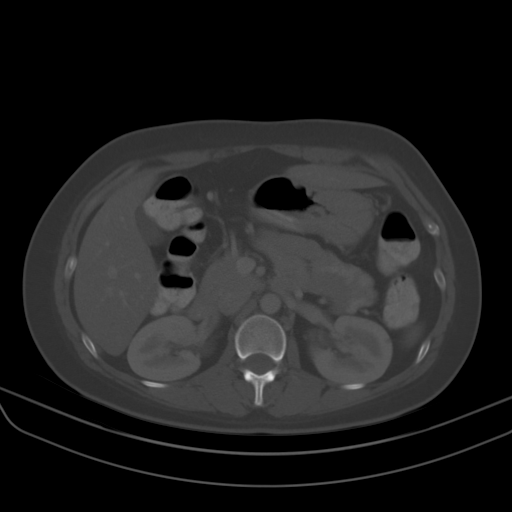
[im 76/102  soft-tissue]
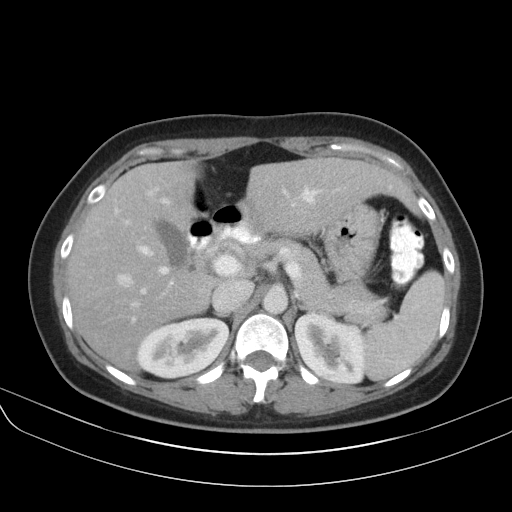
[im 76/102  lung]
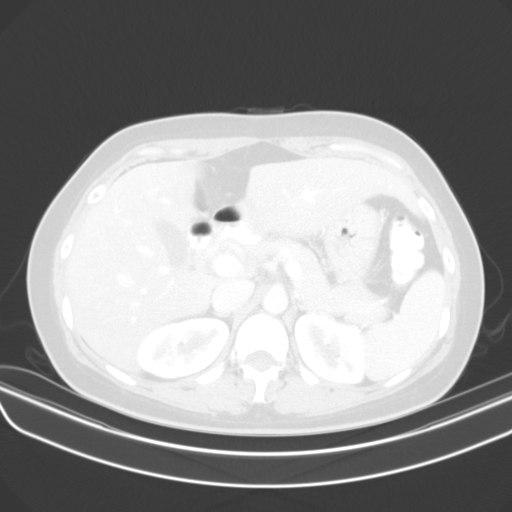
[im 83/102  lung]
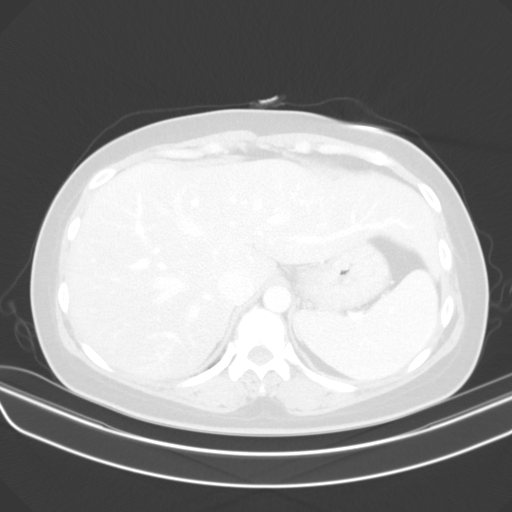
[im 89/102  soft-tissue]
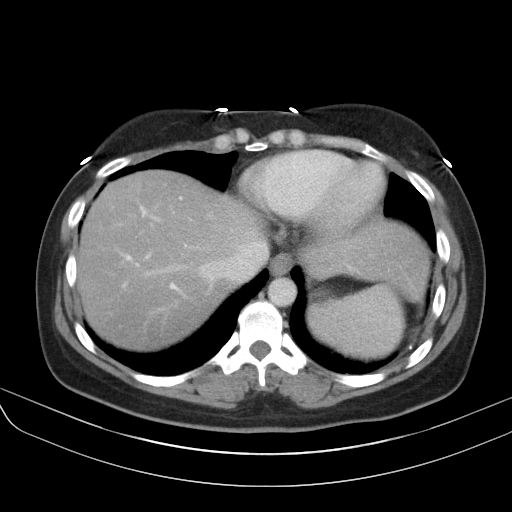
[im 89/102  lung]
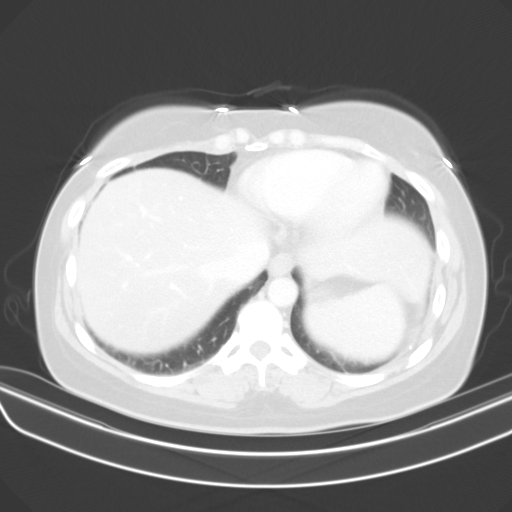
[im 95/102  soft-tissue]
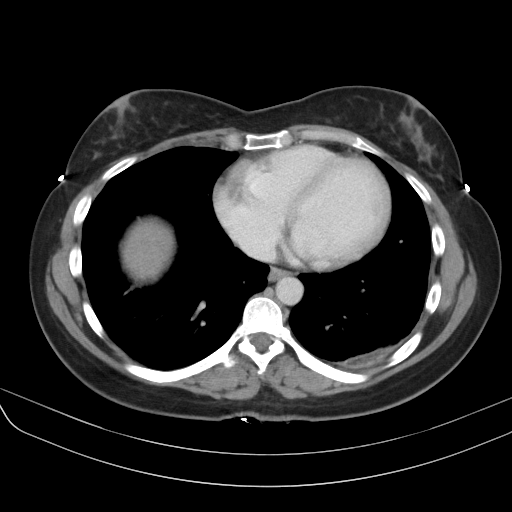
[im 95/102  lung]
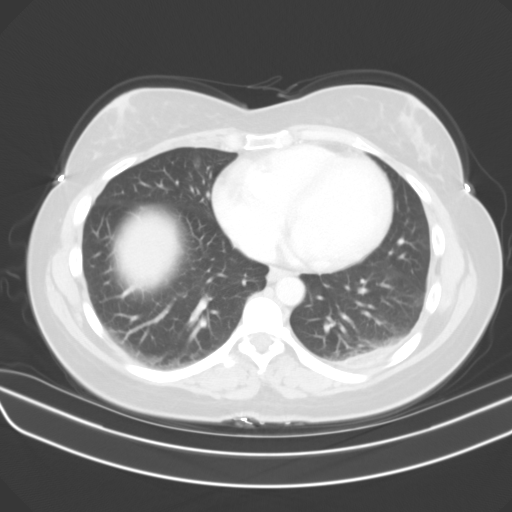

[13 of 32 positions shown; findings below may reference images not displayed]

RADIATION DOSE REDUCTION: This exam was performed according to the
departmental dose-optimization program which includes automated
exposure control, adjustment of the mA and/or kV according to
patient size and/or use of iterative reconstruction technique.

CONTRAST:  100mL [4T] IOPAMIDOL ([4T]) INJECTION 61%
FINDINGS: Lower chest: Normal heart size. Dependent atelectasis within the
bilateral lower lobes. No pleural effusion. Nonspecific left
posterior pleural thickening with associated punctate calcifications
measuring up to 9 mm in thickness (image 10; series 2).

Hepatobiliary: The liver is normal in size and contour. Fatty
deposition adjacent to the falciform ligament. Gallbladder is
unremarkable. No intrahepatic or extrahepatic biliary ductal
dilatation.

Pancreas: Unremarkable

Spleen: Unremarkable

Adrenals/Urinary Tract: Adrenal glands are normal. Kidneys enhance
symmetrically with contrast. Within the midpole of the left kidney
there is a 4 mm low-attenuation lesion (image 71; series 3).
Additionally within the superior pole the left kidney there is a 4
mm low-attenuation lesion (image 73; series 3). These are both too
small to accurately characterize. No additional renal lesions are
identified. Possible punctate 2 mm stone inferior pole right kidney
(image 73; series 3). Urinary bladder is unremarkable.

Stomach/Bowel: Oral contrast material throughout the small and large
bowel. No abnormal bowel wall thickening or evidence for bowel
obstruction. No free fluid or free intraperitoneal air. Normal
morphology of the stomach.

Vascular/Lymphatic: Normal caliber abdominal aorta. No
retroperitoneal lymphadenopathy.

Reproductive: Uterus and adnexal structures are unremarkable.

Other: Diastasis of the rectus abdominis.

Musculoskeletal: No aggressive or acute appearing osseous lesions.
IMPRESSION: 1. Previously described left renal lesion may represent a
subcentimeter angiomyolipoma however this lesion is to small to
characterize on current exam. There is one additional subcentimeter
too small to characterize lesion within the superior pole of the
left kidney. Consider follow-up renal ultrasound in 1 year to
reassess.
2. Nonspecific thickening of the pleura involving the posterior left
chest. Recommend evaluation with dedicated chest CT.
3. Probable punctate stone inferior pole right kidney.

## 2021-08-07 MED ORDER — IOPAMIDOL (ISOVUE-300) INJECTION 61%
100.0000 mL | Freq: Once | INTRAVENOUS | Status: AC | PRN
  Administered 2021-08-07: 100 mL via INTRAVENOUS

## 2021-08-22 ENCOUNTER — Other Ambulatory Visit: Payer: Self-pay | Admitting: Internal Medicine

## 2021-08-22 DIAGNOSIS — J929 Pleural plaque without asbestos: Secondary | ICD-10-CM

## 2022-05-24 ENCOUNTER — Ambulatory Visit: Payer: Self-pay | Admitting: Nurse Practitioner

## 2022-10-14 ENCOUNTER — Encounter: Payer: Self-pay | Admitting: Nurse Practitioner

## 2022-10-14 ENCOUNTER — Ambulatory Visit (INDEPENDENT_AMBULATORY_CARE_PROVIDER_SITE_OTHER): Payer: Medicaid Other | Admitting: Nurse Practitioner

## 2022-10-14 ENCOUNTER — Other Ambulatory Visit (HOSPITAL_COMMUNITY)
Admission: RE | Admit: 2022-10-14 | Discharge: 2022-10-14 | Disposition: A | Discharge status: Home / Self Care | Payer: Medicaid Other | Source: Ambulatory Visit | Attending: Nurse Practitioner | Admitting: Nurse Practitioner

## 2022-10-14 VITALS — BP 101/67 | HR 76 | Temp 97.3°F | Ht 65.0 in | Wt 149.8 lb

## 2022-10-14 DIAGNOSIS — Z1329 Encounter for screening for other suspected endocrine disorder: Secondary | ICD-10-CM

## 2022-10-14 DIAGNOSIS — N946 Dysmenorrhea, unspecified: Secondary | ICD-10-CM

## 2022-10-14 DIAGNOSIS — Z13220 Encounter for screening for lipoid disorders: Secondary | ICD-10-CM

## 2022-10-14 DIAGNOSIS — E782 Mixed hyperlipidemia: Secondary | ICD-10-CM | POA: Diagnosis not present

## 2022-10-14 DIAGNOSIS — R6882 Decreased libido: Secondary | ICD-10-CM

## 2022-10-14 DIAGNOSIS — Z124 Encounter for screening for malignant neoplasm of cervix: Secondary | ICD-10-CM | POA: Diagnosis present

## 2022-10-14 DIAGNOSIS — R7301 Impaired fasting glucose: Secondary | ICD-10-CM

## 2022-10-14 MED ORDER — OMEGA-3-ACID ETHYL ESTERS 1 G PO CAPS
2.0000 g | ORAL_CAPSULE | Freq: Every day | ORAL | 2 refills | Status: DC
Start: 2022-10-14 — End: 2023-09-26

## 2022-10-14 MED ORDER — ATORVASTATIN CALCIUM 40 MG PO TABS
40.0000 mg | ORAL_TABLET | Freq: Every day | ORAL | 2 refills | Status: DC
Start: 2022-10-14 — End: 2023-04-17

## 2022-10-14 MED ORDER — IBUPROFEN 600 MG PO TABS
600.0000 mg | ORAL_TABLET | Freq: Three times a day (TID) | ORAL | 0 refills | Status: DC | PRN
Start: 2022-10-14 — End: 2023-05-19

## 2022-10-14 NOTE — Assessment & Plan Note (Signed)
-   TSH  2. Lipid screening  - Lipid Panel  3. Mixed hyperlipidemia  - CBC - Comprehensive metabolic panel - Lipid Panel - omega-3 acid ethyl esters (LOVAZA) 1 g capsule; Take 2 capsules (2 g total) by mouth daily.  Dispense: 60 capsule; Refill: 2 - atorvastatin (LIPITOR) 40 MG tablet; Take 1 tablet (40 mg total) by mouth daily.  Dispense: 30 tablet; Refill: 2  4. Cervical cancer screening  - Cytology - PAP([place])  5. Severe menstrual cramps  - ibuprofen (ADVIL) 600 MG tablet; Take 1 tablet (600 mg total) by mouth every 8 (eight) hours as needed.  Dispense: 30 tablet; Refill: 0 - Ambulatory referral to Obstetrics / Gynecology  6. Decreased libido  - ibuprofen (ADVIL) 600 MG tablet; Take 1 tablet (600 mg total) by mouth every 8 (eight) hours as needed.  Dispense: 30 tablet; Refill: 0 - Ambulatory referral to Obstetrics / Gynecology  Follow up:  Follow up in 6 months

## 2022-10-14 NOTE — Patient Instructions (Signed)
1. Thyroid disorder screen  - TSH  2. Lipid screening  - Lipid Panel  3. Mixed hyperlipidemia  - CBC - Comprehensive metabolic panel - Lipid Panel - omega-3 acid ethyl esters (LOVAZA) 1 g capsule; Take 2 capsules (2 g total) by mouth daily.  Dispense: 60 capsule; Refill: 2 - atorvastatin (LIPITOR) 40 MG tablet; Take 1 tablet (40 mg total) by mouth daily.  Dispense: 30 tablet; Refill: 2  4. Cervical cancer screening  - Cytology - PAP([place])  5. Severe menstrual cramps  - ibuprofen (ADVIL) 600 MG tablet; Take 1 tablet (600 mg total) by mouth every 8 (eight) hours as needed.  Dispense: 30 tablet; Refill: 0 - Ambulatory referral to Obstetrics / Gynecology  6. Decreased libido  - ibuprofen (ADVIL) 600 MG tablet; Take 1 tablet (600 mg total) by mouth every 8 (eight) hours as needed.  Dispense: 30 tablet; Refill: 0 - Ambulatory referral to Obstetrics / Gynecology  Follow up:  Follow up in 6 months

## 2022-10-14 NOTE — Addendum Note (Signed)
Addended by: Ivonne Andrew on: 10/14/2022 10:58 AM   Modules accepted: Orders

## 2022-10-14 NOTE — Progress Notes (Signed)
@Patient  ID: Carol Rangel, female    DOB: April 13, 1985, 37 y.o.   MRN: [id number]  Chief Complaint  Patient presents with   Annual Exam    Having bad cramps during menstrual cycle    Referring provider: Ivonne Andrew, NP   HPI  [facility] 37 y.o. female  has a past medical history of Gestational diabetes, Medical history non-contributory, Nausea with vomiting (12/20/2013), and Sciatica of right side (09/24/2019). To the [facility] to establish care. Patient states that while pregnant, she was diagnosed and treated for gestational diabetes. Had uncomplicated vaginal delivery on 04/04/21.   Patient presents today for complete physical.  Overall she has been doing good since last year.  She did not realize that she was supposed to continue her cholesterol medicine after her last physical with Tawana.  We discussed that she does need to continue this and we will recheck lipid panel today.  Will place a refill.  Patient complains today of severe menstrual cramps.  We will prescribe ibuprofen.  She also complains of decreased libido.  We will place referral for OB/GYN for this patient. Denies f/c/s, n/v/d, hemoptysis, PND, leg swelling Denies chest pain or edema     No Known Allergies  Immunization History  Administered Date(s) Administered   Tdap 10/28/2019, 01/31/2021    Past Medical History:  Diagnosis Date   Gestational diabetes    Medical history non-contributory    Nausea with vomiting 12/20/2013   Sciatica of right side 09/24/2019    Tobacco History: Social History   Tobacco Use  Smoking Status Never  Smokeless Tobacco Never   Counseling given: Not Answered   Outpatient Encounter Medications as of 10/14/2022  Medication Sig   ibuprofen (ADVIL) 600 MG tablet Take 1 tablet (600 mg total) by mouth every 8 (eight) hours as needed.   atorvastatin (LIPITOR) 40 MG tablet Take 1 tablet (40 mg total) by mouth daily.   omega-3 acid ethyl esters (LOVAZA) 1 g capsule Take 2 capsules  (2 g total) by mouth daily.   [DISCONTINUED] atorvastatin (LIPITOR) 40 MG tablet Take 1 tablet (40 mg total) by mouth daily. (Patient not taking: Reported on 10/14/2022)   [DISCONTINUED] omega-3 acid ethyl esters (LOVAZA) 1 g capsule Take 2 capsules (2 g total) by mouth daily. (Patient not taking: Reported on 10/14/2022)   No facility-administered encounter medications on file as of 10/14/2022.     Review of Systems  Review of Systems  Constitutional: Negative.   HENT: Negative.    Cardiovascular: Negative.   Gastrointestinal: Negative.   Allergic/Immunologic: Negative.   Neurological: Negative.   Psychiatric/Behavioral: Negative.         Physical Exam  BP 101/67   Pulse 76   Temp (!) 97.3 F (36.3 C)   Ht 5\' 5"  (1.651 m)   Wt 149 lb 12.8 oz (67.9 kg)   SpO2 99%   BMI 24.93 kg/m   Wt Readings from Last 5 Encounters:  10/14/22 149 lb 12.8 oz (67.9 kg)  05/23/21 153 lb 3.2 oz (69.5 kg)  05/17/21 152 lb (68.9 kg)  04/03/21 161 lb 2.5 oz (73.1 kg)  03/29/21 161 lb 14.4 oz (73.4 kg)     Physical Exam Vitals and nursing note reviewed. Exam conducted with a chaperone present.  Constitutional:      General: She is not in acute distress.    Appearance: She is well-developed.  Cardiovascular:     Rate and Rhythm: Normal rate and regular rhythm.  Pulmonary:  Effort: Pulmonary effort is normal.     Breath sounds: Normal breath sounds.  Genitourinary:    General: Normal vulva.     Vagina: Normal.     Cervix: Normal.     Uterus: Normal.      Adnexa: Right adnexa normal and left adnexa normal.  Neurological:     Mental Status: She is alert and oriented to person, place, and time.      Lab Results:  CBC    Component Value Date/Time   WBC 7.8 05/23/2021 1359   WBC 9.3 04/03/2021 1844   RBC 4.59 05/23/2021 1359   RBC 3.88 04/03/2021 1844   HGB 13.1 05/23/2021 1359   HCT 39.8 05/23/2021 1359   PLT 234 05/23/2021 1359   MCV 87 05/23/2021 1359   MCH 28.5  05/23/2021 1359   MCH 28.9 04/03/2021 1844   MCHC 32.9 05/23/2021 1359   MCHC 31.5 04/03/2021 1844   RDW 13.4 05/23/2021 1359   LYMPHSABS 3.0 05/23/2021 1359   MONOABS 0.7 01/23/2021 1554   EOSABS 0.3 05/23/2021 1359   BASOSABS 0.1 05/23/2021 1359    BMET    Component Value Date/Time   NA 136 05/23/2021 1359   K 4.1 05/23/2021 1359   CL 103 05/23/2021 1359   CO2 20 05/23/2021 1359   GLUCOSE 96 05/23/2021 1359   GLUCOSE 88 01/23/2021 1554   BUN 10 05/23/2021 1359   CREATININE 0.58 05/23/2021 1359   CALCIUM 10.0 05/23/2021 1359   GFRNONAA >60 01/23/2021 1554   GFRAA >90 12/20/2013 0530    BNP No results found for: "BNP"  ProBNP    Component Value Date/Time   PROBNP 11.7 12/18/2013 1045    Imaging: No results found.   Assessment & Plan:   Thyroid disorder screen - TSH  2. Lipid screening  - Lipid Panel  3. Mixed hyperlipidemia  - CBC - Comprehensive metabolic panel - Lipid Panel - omega-3 acid ethyl esters (LOVAZA) 1 g capsule; Take 2 capsules (2 g total) by mouth daily.  Dispense: 60 capsule; Refill: 2 - atorvastatin (LIPITOR) 40 MG tablet; Take 1 tablet (40 mg total) by mouth daily.  Dispense: 30 tablet; Refill: 2  4. Cervical cancer screening  - Cytology - PAP([place])  5. Severe menstrual cramps  - ibuprofen (ADVIL) 600 MG tablet; Take 1 tablet (600 mg total) by mouth every 8 (eight) hours as needed.  Dispense: 30 tablet; Refill: 0 - Ambulatory referral to Obstetrics / Gynecology  6. Decreased libido  - ibuprofen (ADVIL) 600 MG tablet; Take 1 tablet (600 mg total) by mouth every 8 (eight) hours as needed.  Dispense: 30 tablet; Refill: 0 - Ambulatory referral to Obstetrics / Gynecology  Follow up:  Follow up in 6 months     Ivonne Andrew, NP 10/14/2022

## 2022-11-11 ENCOUNTER — Telehealth: Payer: Self-pay | Admitting: Nurse Practitioner

## 2022-11-11 NOTE — Telephone Encounter (Signed)
Pt called asking about her OBGYN referral Very hard to understand her.

## 2023-02-18 ENCOUNTER — Ambulatory Visit (INDEPENDENT_AMBULATORY_CARE_PROVIDER_SITE_OTHER): Payer: Medicaid Other | Admitting: Obstetrics and Gynecology

## 2023-02-18 ENCOUNTER — Encounter: Payer: Self-pay | Admitting: Obstetrics and Gynecology

## 2023-02-18 VITALS — BP 117/76 | HR 75 | Wt 151.6 lb

## 2023-02-18 DIAGNOSIS — N814 Uterovaginal prolapse, unspecified: Secondary | ICD-10-CM

## 2023-02-18 DIAGNOSIS — N946 Dysmenorrhea, unspecified: Secondary | ICD-10-CM | POA: Insufficient documentation

## 2023-02-18 DIAGNOSIS — R6882 Decreased libido: Secondary | ICD-10-CM | POA: Diagnosis not present

## 2023-02-18 MED ORDER — BUPROPION HCL ER (XL) 300 MG PO TB24: 300.0000 mg | ORAL_TABLET | Freq: Every day | ORAL | 1 refills | Status: AC

## 2023-02-18 MED ORDER — BUPROPION HCL ER (XL) 150 MG PO TB24
ORAL_TABLET | ORAL | 0 refills | Status: DC
Start: 2023-02-18 — End: 2023-02-18

## 2023-02-18 MED ORDER — BUPROPION HCL ER (XL) 150 MG PO TB24
150.0000 mg | ORAL_TABLET | Freq: Every day | ORAL | 0 refills | Status: AC
Start: 2023-02-18 — End: 2023-04-16

## 2023-02-18 MED ORDER — NORETHINDRONE 0.35 MG PO TABS
1.0000 | ORAL_TABLET | Freq: Every day | ORAL | 3 refills | Status: AC
Start: 2023-02-18 — End: ?

## 2023-02-18 NOTE — Progress Notes (Signed)
GYN EXAM Patient name: Carol Rangel MRN [id number]  Date of birth: Jul 23, 1985 Chief Complaint:   Dysmenorrhea  History of Present Illness:   Bopha Falu is a 37 y.o. G34P2002 female being seen today for GYN problem visit.  Current complaints:  - Referred from PCP for low libido and breakthrough bleeding. - Nexplanon placed last year after last pregnancy. Every other month bleeding. Will bleed for ~9 days. Heavy with cramping that is bothersome. Initially bleeding was lighter when Nexplanon first placed. Does not desire pregnancy at this time. - Low libido since having her first baby in 2021. Reports no desire to have intercourse. States relationship with her husband is good otherwise and feels supported by him. Does feel fatigued often; some feelings of depression but not consistent. Denies any pelvic pain at baseline or during intercourse. Does have known uterine prolapse. This has improved since last pregnancy but is bothersome when sitting down for long periods of time.  Patient's last menstrual period was 01/03/2023 (approximate).  UTD on pap.     10/14/2022   10:21 AM 05/23/2021    1:22 PM 03/29/2021    4:08 PM 03/22/2021   10:05 AM 03/06/2021    3:47 PM  Depression screen PHQ 2/9  Decreased Interest 0 0 0 0 0  Down, Depressed, Hopeless 0 0 0 0 0  PHQ - 2 Score 0 0 0 0 0  Altered sleeping   0 1 0  Tired, decreased energy   1 1 1   Change in appetite   1 1 1   Feeling bad or failure about yourself    0 0 0  Trouble concentrating   0 0 0  Moving slowly or fidgety/restless   0 0 0  Suicidal thoughts   0 0 0  PHQ-9 Score   2 3 2         03/29/2021    4:08 PM 03/22/2021   10:05 AM 03/06/2021    3:48 PM 01/08/2021    4:33 PM  GAD 7 : Generalized Anxiety Score  Nervous, Anxious, on Edge 0 0 0 0  Control/stop worrying 0 0 0 0  Worry too much - different things 0 0 0 0  Trouble relaxing 0 1 0 1  Restless 0 0 0 0  Easily annoyed or irritable 0 0 0 0  Afraid - awful might happen 0 0 0  0  Total GAD 7 Score 0 1 0 1     Review of Systems:   Pertinent items are noted in HPI Denies any headaches, blurred vision, fatigue, shortness of breath, chest pain, abdominal pain, abnormal vaginal discharge/itching/odor/irritation, problems with periods, bowel movements, urination, or intercourse unless otherwise stated above. Pertinent History Reviewed:  Reviewed past medical,surgical, social and family history.  Reviewed problem list, medications and allergies. Physical Assessment:   Vitals:   02/18/23 0820  BP: 117/76  Pulse: 75  Weight: 151 lb 9.6 oz (68.8 kg)  Body mass index is 25.23 kg/m.        Physical Examination:   General appearance - well appearing, and in no distress  Mental status - alert, oriented to person, place, and time  Psych:  She has a normal mood and affect  Skin - warm and dry, normal color, no suspicious lesions noted  Chest - effort normal  Heart - normal rate and regular rhythm  Neck:  midline trachea, no thyromegaly or nodules  Abdomen - soft, nontender, nondistended, no masses or organomegaly  masses felt  Extremities:  No swelling or varicosities noted  No results found for this or any previous visit (from the past 24 hour(s)).  Assessment & Plan:   1. Dysmenorrhea Having irregular bleeding with Nexplanon. Discussed this is common but certainly bothersome. Still providing contraception. Discussed options including taking short course of POPs to help regulate, removing Nexplanon, etc. She elects to try to regulate for next few months. Recent CBC wnl.  - norethindrone (MICRONOR) 0.35 MG tablet; Take 1 tablet (0.35 mg total) by mouth daily. Take for 21 days, then stop for 7 days to have a period. Repeat monthly  Dispense: 63 tablet; Refill: 3  2. Uterine prolapse Known problem.  - Ambulatory referral to Physical Therapy  3. Low libido Suspect psycho/social components at this time. No pelvic pain, discomfort, discharge to suggest anatomical  problem. Additionally has had recent TSH and blood work with PCP that was wnl. Feels safe in current relationship. Discussed physical floor PT and Wellbutrin. She is amenable.  - buPROPion (WELLBUTRIN XL) 150 MG 24 hr tablet; Take 1 tablet (150 mg total) by mouth daily for 14 days, THEN 2 tablets (300 mg total) daily.  Dispense: 166 tablet; Refill: 0   Patient's preferred language is Burmese. Str[employer] translator used during patient interaction.  Orders Placed This Encounter  Procedures   Ambulatory referral to Physical Therapy    Meds:  Meds ordered this encounter  Medications   buPROPion (WELLBUTRIN XL) 150 MG 24 hr tablet    Sig: Take 1 tablet (150 mg total) by mouth daily for 14 days, THEN 2 tablets (300 mg total) daily.    Dispense:  166 tablet    Refill:  0   norethindrone (MICRONOR) 0.35 MG tablet    Sig: Take 1 tablet (0.35 mg total) by mouth daily. Take for 21 days, then stop for 7 days to have a period. Repeat monthly    Dispense:  63 tablet    Refill:  3    Follow-up: Return in about 3 months (around 05/19/2023) for Return gyn.  Joanne Gavel, MD 02/18/2023 9:22 AM

## 2023-02-18 NOTE — Addendum Note (Signed)
Addended by: Joanne Gavel on: 02/18/2023 12:13 PM   Modules accepted: Orders

## 2023-04-16 ENCOUNTER — Encounter: Payer: Self-pay | Admitting: Nurse Practitioner

## 2023-04-16 ENCOUNTER — Ambulatory Visit (INDEPENDENT_AMBULATORY_CARE_PROVIDER_SITE_OTHER): Payer: Medicaid Other | Admitting: Nurse Practitioner

## 2023-04-16 VITALS — BP 120/70 | HR 80 | Temp 97.7°F | Wt 154.6 lb

## 2023-04-16 DIAGNOSIS — R42 Dizziness and giddiness: Secondary | ICD-10-CM

## 2023-04-16 DIAGNOSIS — E782 Mixed hyperlipidemia: Secondary | ICD-10-CM

## 2023-04-16 MED ORDER — MECLIZINE HCL 12.5 MG PO TABS: 12.5000 mg | ORAL_TABLET | Freq: Three times a day (TID) | ORAL | 0 refills | Status: AC | PRN

## 2023-04-16 NOTE — Patient Instructions (Signed)
1. Mixed hyperlipidemia (Primary)  - Lipid Panel - CBC - Comprehensive metabolic panel  2. Vertigo  - meclizine (ANTIVERT) 12.5 MG tablet; Take 1 tablet (12.5 mg total) by mouth 3 (three) times daily as needed.  Dispense: 30 tablet; Refill: 0  Follow up:  Follow up in 6 months

## 2023-04-16 NOTE — Progress Notes (Signed)
Subjective   Patient ID: Carol Rangel, female    DOB: 1985-04-27, 38 y.o.   MRN: [id number]  Chief Complaint  Patient presents with   Medical Management of Chronic Issues    Follow up on [facility]    Referring provider: Ivonne Andrew, NP  Carol Rangel is a 38 y.o. female with Past Medical History: No date: Gestational diabetes No date: Medical history non-contributory 12/20/2013: Nausea with vomiting 09/24/2019: Sciatica of right side   HPI  Patient presents today for complete physical.  Overall she has been doing good since last year.  She did not realize that she was supposed to continue her cholesterol medicine after her last physical with Tawana.  We discussed that she does need to continue this and we will recheck lipid panel today.  Will place a refill.  Patient complains today of severe menstrual cramps.  We will prescribe ibuprofen.  She also complains of decreased libido.  We will place referral for OB/GYN for this patient. Patient also complains today of vertigo. Especially when driving. Will order meclizine. Denies f/c/s, n/v/d, hemoptysis, PND, leg swelling Denies chest pain or edema.   No Known Allergies  Immunization History  Administered Date(s) Administered   Tdap 10/28/2019, 01/31/2021    Tobacco History: Social History   Tobacco Use  Smoking Status Never  Smokeless Tobacco Never   Counseling given: Not Answered   Outpatient Encounter Medications as of 04/16/2023  Medication Sig   buPROPion (WELLBUTRIN XL) 150 MG 24 hr tablet Take 1 tablet (150 mg total) by mouth daily for 14 days.   buPROPion (WELLBUTRIN XL) 300 MG 24 hr tablet Take 1 tablet (300 mg total) by mouth daily.   meclizine (ANTIVERT) 12.5 MG tablet Take 1 tablet (12.5 mg total) by mouth 3 (three) times daily as needed.   norethindrone (MICRONOR) 0.35 MG tablet Take 1 tablet (0.35 mg total) by mouth daily. Take for 21 days, then stop for 7 days to have a period. Repeat monthly   atorvastatin  (LIPITOR) 40 MG tablet Take 1 tablet (40 mg total) by mouth daily. (Patient not taking: Reported on 04/16/2023)   ibuprofen (ADVIL) 600 MG tablet Take 1 tablet (600 mg total) by mouth every 8 (eight) hours as needed. (Patient not taking: Reported on 04/16/2023)   omega-3 acid ethyl esters (LOVAZA) 1 g capsule Take 2 capsules (2 g total) by mouth daily. (Patient not taking: Reported on 04/16/2023)   No facility-administered encounter medications on file as of 04/16/2023.    Review of Systems  Review of Systems  Constitutional: Negative.   HENT: Negative.    Cardiovascular: Negative.   Gastrointestinal: Negative.   Allergic/Immunologic: Negative.   Neurological: Negative.   Psychiatric/Behavioral: Negative.       Objective:   BP 120/70   Pulse 80   Temp 97.7 F (36.5 C)   Wt 154 lb 9.6 oz (70.1 kg)   SpO2 97%   BMI 25.73 kg/m   Wt Readings from Last 5 Encounters:  04/16/23 154 lb 9.6 oz (70.1 kg)  02/18/23 151 lb 9.6 oz (68.8 kg)  10/14/22 149 lb 12.8 oz (67.9 kg)  05/23/21 153 lb 3.2 oz (69.5 kg)  05/17/21 152 lb (68.9 kg)     Physical Exam Vitals and nursing note reviewed.  Constitutional:      General: She is not in acute distress.    Appearance: She is well-developed.  Cardiovascular:     Rate and Rhythm: Normal rate and regular rhythm.  Pulmonary:  Effort: Pulmonary effort is normal.     Breath sounds: Normal breath sounds.  Neurological:     Mental Status: She is alert and oriented to person, place, and time.       Assessment & Plan:   Mixed hyperlipidemia -     Lipid panel -     CBC -     Comprehensive metabolic panel  Vertigo -     Meclizine HCl; Take 1 tablet (12.5 mg total) by mouth 3 (three) times daily as needed.  Dispense: 30 tablet; Refill: 0     Return in about 6 months (around 10/14/2023).   Ivonne Andrew, NP 04/16/2023

## 2023-04-17 ENCOUNTER — Other Ambulatory Visit: Payer: Self-pay | Admitting: Nurse Practitioner

## 2023-04-17 DIAGNOSIS — E782 Mixed hyperlipidemia: Secondary | ICD-10-CM

## 2023-04-17 LAB — COMPREHENSIVE METABOLIC PANEL
ALT: 14 [IU]/L (ref 0–32)
AST: 11 [IU]/L (ref 0–40)
Albumin: 4.8 g/dL (ref 3.9–4.9)
Alkaline Phosphatase: 71 [IU]/L (ref 44–121)
BUN/Creatinine Ratio: 10 (ref 9–23)
BUN: 7 mg/dL (ref 6–20)
Bilirubin Total: 0.3 mg/dL (ref 0.0–1.2)
CO2: 21 mmol/L (ref 20–29)
Calcium: 9.7 mg/dL (ref 8.7–10.2)
Chloride: 104 mmol/L (ref 96–106)
Creatinine, Ser: 0.68 mg/dL (ref 0.57–1.00)
Globulin, Total: 2.9 g/dL (ref 1.5–4.5)
Glucose: 95 mg/dL (ref 70–99)
Potassium: 3.9 mmol/L (ref 3.5–5.2)
Sodium: 140 mmol/L (ref 134–144)
Total Protein: 7.7 g/dL (ref 6.0–8.5)
eGFR: 114 mL/min/{1.73_m2} (ref >59)

## 2023-04-17 LAB — CBC
Hematocrit: 42 % (ref 34.0–46.6)
Hemoglobin: 14 g/dL (ref 11.1–15.9)
MCH: 31.5 pg (ref 26.6–33.0)
MCHC: 33.3 g/dL (ref 31.5–35.7)
MCV: 94 fL (ref 79–97)
Platelets: 227 10*3/uL (ref 150–450)
RBC: 4.45 x10E6/uL (ref 3.77–5.28)
RDW: 11.6 % — ABNORMAL LOW (ref 11.7–15.4)
WBC: 10.2 10*3/uL (ref 3.4–10.8)

## 2023-04-17 LAB — LIPID PANEL
Chol/HDL Ratio: 5.8 {ratio} — ABNORMAL HIGH (ref 0.0–4.4)
Cholesterol, Total: 215 mg/dL — ABNORMAL HIGH (ref 100–199)
HDL: 37 mg/dL — ABNORMAL LOW (ref >39)
LDL Chol Calc (NIH): 145 mg/dL — ABNORMAL HIGH (ref 0–99)
Triglycerides: 181 mg/dL — ABNORMAL HIGH (ref 0–149)
VLDL Cholesterol Cal: 33 mg/dL (ref 5–40)

## 2023-04-17 MED ORDER — ATORVASTATIN CALCIUM 40 MG PO TABS: 40.0000 mg | ORAL_TABLET | Freq: Every day | ORAL | 2 refills | Status: DC

## 2023-05-06 ENCOUNTER — Telehealth: Payer: Self-pay

## 2023-05-06 NOTE — Progress Notes (Signed)
   05/06/2023  Patient ID: Carol Rangel, female   DOB: October 26, 1985, 38 y.o.   MRN: [id number]  Contacted patient regarding referral for hyperlipidemia from Ivonne Andrew, NP .   Left patient a voicemail to return my call at their convenience  Harlon Flor, PharmD Clinical Pharmacist  [phone]

## 2023-05-13 ENCOUNTER — Telehealth: Payer: Self-pay

## 2023-05-13 ENCOUNTER — Other Ambulatory Visit: Payer: Self-pay

## 2023-05-13 ENCOUNTER — Ambulatory Visit: Payer: Medicaid Other | Attending: Obstetrics and Gynecology

## 2023-05-13 DIAGNOSIS — M62838 Other muscle spasm: Secondary | ICD-10-CM | POA: Insufficient documentation

## 2023-05-13 DIAGNOSIS — N814 Uterovaginal prolapse, unspecified: Secondary | ICD-10-CM | POA: Diagnosis not present

## 2023-05-13 DIAGNOSIS — M5459 Other low back pain: Secondary | ICD-10-CM | POA: Diagnosis present

## 2023-05-13 DIAGNOSIS — M6281 Muscle weakness (generalized): Secondary | ICD-10-CM | POA: Insufficient documentation

## 2023-05-13 DIAGNOSIS — R293 Abnormal posture: Secondary | ICD-10-CM | POA: Insufficient documentation

## 2023-05-13 DIAGNOSIS — R279 Unspecified lack of coordination: Secondary | ICD-10-CM | POA: Insufficient documentation

## 2023-05-13 NOTE — Therapy (Signed)
 OUTPATIENT PHYSICAL THERAPY FEMALE PELVIC EVALUATION   Patient Name: Carol Rangel MRN: [id number] DOB:10/15/1985, 38 y.o., female Today's Date: 05/13/2023  END OF SESSION:  PT End of Session - 05/13/23 0831     Visit Number 1    Date for PT Re-Evaluation 10/28/23    Authorization Type [employer]    Authorization - Visit Number 1    Authorization - Number of Visits 27    PT Start Time 0845    PT Stop Time 0925    PT Time Calculation (min) 40 min    Activity Tolerance Patient tolerated treatment well    Behavior During Therapy [facility] for tasks assessed/performed             Past Medical History:  Diagnosis Date   Gestational diabetes    Medical history non-contributory    Nausea with vomiting 12/20/2013   Sciatica of right side 09/24/2019   Past Surgical History:  Procedure Laterality Date   BREAST SURGERY Right    ?abscess surgery x2   Patient Active Problem List   Diagnosis Date Noted   Dysmenorrhea 02/18/2023   Thyroid disorder screen 10/14/2022   Nexplanon insertion 05/17/2021   Anemia 11/09/2020   Supervision of other high risk pregnancy, antepartum 10/11/2020   Gestational diabetes 10/11/2020   Short interval between pregnancies affecting pregnancy, antepartum 10/11/2020   AMA (advanced maternal age) multigravida 35+ 10/11/2020   Language barrier affecting health care 10/28/2019   Sciatica of right side 09/24/2019    PCP: Ivonne Andrew, NP  REFERRING PROVIDER: Joanne Gavel, MD   REFERRING DIAG: N81.4 (ICD-10-CM) - Uterine prolapse  THERAPY DIAG:  Other muscle spasm  Muscle weakness (generalized)  Unspecified lack of coordination  Abnormal posture  Other low back pain  Rationale for Evaluation and Treatment: Rehabilitation  ONSET DATE: 3 years  SUBJECTIVE:                                                                                                                                                                                            SUBJECTIVE STATEMENT: Pt states that she cannot control her pee. It started after first pregnancy.  Fluid intake: inconsistent, will limit fluids to not go to the bathroom as much  PAIN:  Are you having pain? Yes NPRS scale: 5/10 Pain location:  low back  Pain type: aching and tight Pain description: intermittent   Aggravating factors: lifting Relieving factors: unsure  PRECAUTIONS: None  RED FLAGS: None   WEIGHT BEARING RESTRICTIONS: No  FALLS:  Has patient fallen in last 6 months? No  OCCUPATION: not working  ACTIVITY LEVEL : does  some kegels   PLOF: Independent  PATIENT GOALS: to get better bladder control  PERTINENT HISTORY:  2 vaginal deliveries    BOWEL MOVEMENT: Pain with bowel movement: No Type of bowel movement:Frequency 1-2x/day and Strain none Fully empty rectum: Yes:   Leakage: No Pads: No Fiber supplement/laxative No  URINATION: Pain with urination: No Fully empty bladder: Yes: she does have post void dribbling  Stream: Strong Urgency: Yes  Frequency: unsure; not waking up at night Leakage: Urge to void, Walking to the bathroom, Coughing, Sneezing, and Laughing Pads: Yes: 2, will have to wear a diaper if she is leaking a lot  INTERCOURSE:  Ability to have vaginal penetration  yes, but she does not feel any desire to have intercourse right now Pain with intercourse: none DrynessNo Climax: - Marinoff Scale: 0/3   PREGNANCY: Vaginal deliveries 2 Tearing Yes:   Episiotomy No C-section deliveries 0 Currently pregnant No  PROLAPSE: Pressure and Bulge   OBJECTIVE:  Note: Objective measures were completed at Evaluation unless otherwise noted. 05/13/23:  PATIENT SURVEYS:   PFIQ-7: 40  COGNITION: Overall cognitive status: Within functional limits for tasks assessed     SENSATION: Light touch: Appears intact  FUNCTIONAL TESTS:  Squat: Rt weight shift, almost leaked Single leg stance:  Rt: Large pelvic drop  Lt: No  pelvic drop, but twisting to the Lt Curl-up test: mild distortion   GAIT: Assistive device utilized: None Comments: WNL  POSTURE: rounded shoulders, forward head, increased thoracic kyphosis, posterior pelvic tilt, and Rt posterior rotation   PALPATION:   General: WNL  Pelvic Alignment: Rt posterior rotation  Abdominal: Apical breathing pattern; tenderness in Lt lower quadrant; bracing in bil obliques                External Perineal Exam: WNL                             Internal Pelvic Floor: perineal scar tissue restriction; pain and tenderness at posterior fourchette and throughout superficial and deep Rt pelvic floor muscles; pain and tenderness in posterior deep Lt levator ani  Patient confirms identification and approves PT to assess internal pelvic floor and treatment Yes  PELVIC MMT:   MMT eval  Vaginal 2/5, no endurance, 4 repeat contractions   Diastasis Recti 1.5 finger width separation superior to umbilicus and 1 finger width inferior to umbilicus  (Blank rows = not tested)        TONE: High, Rt  PROLAPSE: Grade 2 lower anterior vaginal wall/urethral laxity  TODAY'S TREATMENT:                                                                                                                              DATE:  05/13/23  EVAL Neuromuscular re-education: Pt provides verbal consent for internal vaginal/rectal pelvic floor exam. Internal vaginal pelvic floor muscle contraction training Quick flicks/ A/ROM with emphasis on relaxation  Diaphragmatic breathing Cat cow 10x Child's pose 10 breaths Lower trunk rotation 10x Therapeutic activities: Double voiding Urge drill    PATIENT EDUCATION:  Education details: See above Person educated: Patient Education method: Explanation, Demonstration, Tactile cues, Verbal cues, and Handouts Education comprehension: verbalized understanding  HOME EXERCISE PROGRAM: [id number]  ASSESSMENT:  CLINICAL  IMPRESSION: Patient is a 38 y.o. female who was seen today for physical therapy evaluation and treatment for urinary incontinence. Exam findings notable for abnormal posture, decreased lumbar A/ROM, core weakness/decreased stability in standing positions, increased leaking with squat, abdominal tenderness, diastasis recti abdominus with mild distortion, perineal scar tissue restriction, significant tenderness and increased tone in Rt pelvic floor muscles, pelvic floor muscle weakness and poor coordination, no pelvic floor muscle endurance, and anterior vaginal wall and urethral laxity. Signs and symptoms are most consistent with high tone pelvic floor muscles that are also weak and lack appropriate coordination. Initial treatment consisted of pelvic floor muscle contraction and relaxation training, urge drill, and double voiding. She will continue to benefit from skilled PT intervention in order to decrease urinary incontinence, improve low back pain, correct abdominal pressure management, and begin/progress functional strengthening program.   OBJECTIVE IMPAIRMENTS: decreased activity tolerance, decreased coordination, decreased endurance, decreased mobility, decreased ROM, decreased strength, increased fascial restrictions, increased muscle spasms, impaired tone, postural dysfunction, and pain.   ACTIVITY LIMITATIONS: continence  PARTICIPATION LIMITATIONS: cleaning, laundry, interpersonal relationship, and community activity  PERSONAL FACTORS: 1 comorbidity: medical history  are also affecting patient's functional outcome.   REHAB POTENTIAL: Good  CLINICAL DECISION MAKING: Stable/uncomplicated  EVALUATION COMPLEXITY: Low   GOALS: Goals reviewed with patient? Yes  SHORT TERM GOALS: Target date: 06/10/2023   Pt will be independent with HEP.   Baseline: Goal status: INITIAL  2.  Pt will be independent with the knack, urge suppression technique, and double voiding in order to improve bladder  habits and decrease urinary incontinence.   Baseline:  Goal status: INITIAL  3.  Pt will be able to correctly perform diaphragmatic breathing and appropriate pressure management in order to prevent worsening vaginal wall laxity and improve pelvic floor A/ROM.   Baseline:  Goal status: INITIAL  4.  Pt will report 25% improvement in urinary incontinence.  Baseline:  Goal status: INITIAL  5.  Pt will report no pain or increased tone with palpation of Rt superficial/deep pelvic floor muscles.  Baseline:  Goal status: INITIAL   LONG TERM GOALS: Target date: 10/28/23  Pt will be independent with advanced HEP.   Baseline:  Goal status: INITIAL  2.  Pt will report 75% improvement in urinary incontinence.  Baseline:  Goal status: INITIAL  3.  Pt will report improvement in PFIQ-7 to less than 40 to demonstrate functional improvements.  Baseline:  Goal status: INITIAL  4.  Pt will increase pelvic floor muscle strength to at least 3/5 with appropriate relaxation.  Baseline:  Goal status: INITIAL  5.  Pt will be able to go 2-3 hours in between voids without urgency or incontinence in order to improve QOL and perform all functional activities with less difficulty.   Baseline:  Goal status: INITIAL   PLAN:  PT FREQUENCY: 1-2x/week  PT DURATION: 6 months  PLANNED INTERVENTIONS: 97110-Therapeutic exercises, 97530- Therapeutic activity, 97112- Neuromuscular re-education, 97535- Self Care, 16109- Manual therapy, Dry Needling, and Biofeedback  PLAN FOR NEXT SESSION: Inverted lying position; discuss vibrator to help with libido; pelvic floor muscle release to tolerance; progress down training; begin core strengthening.    Baxter Hire  Alvy Beal, PT, DPT02/25/2510:29 AM

## 2023-05-13 NOTE — Patient Instructions (Signed)
 Double-voiding:  This technique is to help with post-void dribbling, or leaking a little bit when you stand up right after urinating. Use relaxed toileting mechanics to urinate as much as you feel like you have to without straining. Sit back upright from leaning forward and relax this way for 10-20 seconds. Lean forward again to finish voiding any amount more.   Urge Incontinence  Ideal urination frequency is every 2-4 wakeful hours, which equates to 5-8 times within a 24-hour period.   Urge incontinence is leakage that occurs when the bladder muscle contracts, creating a sudden need to go before getting to the bathroom.   Going too often when your bladder isn't actually full can disrupt the body's automatic signals to store and hold urine longer, which will increase urgency/frequency.  In this case, the bladder "is running the show" and strategies can be learned to retrain this pattern.   One should be able to control the first urge to urinate, at around .  The bladder can hold up to a "grande latte," or . To help you gain control, practice the Urge Drill below when urgency strikes.  This drill will help retrain your bladder signals and allow you to store and hold urine longer.  The overall goal is to stretch out your time between voids to reach a more manageable voiding schedule.    Practice your "quick flicks" often throughout the day (each waking hour) even when you don't need feel the urge to go.  This will help strengthen your pelvic floor muscles, making them more effective in controlling leakage.  Urge Drill  When you feel an urge to go, follow these steps to regain control: Stop what you are doing and be still Take one deep breath, directing your air into your abdomen Think an affirming thought, such as "I've got this." Do 5 quick flicks of your pelvic floor Walk with control to the bathroom to void, or delay voiding  [facility] [address] [place], [place] [address] Phone # [phone] Fax [phone]

## 2023-05-13 NOTE — Progress Notes (Signed)
   05/13/2023  Patient ID: Carol Rangel, female   DOB: 21-Mar-1985, 38 y.o.   MRN: [id number]  .Contacted patient regarding referral for hyperlipidemia from Ivonne Andrew, NP .   Left patient a voicemail to return my call at their convenience  Harlon Flor, PharmD Clinical Pharmacist  [phone]

## 2023-05-19 ENCOUNTER — Other Ambulatory Visit: Payer: Self-pay

## 2023-05-19 NOTE — Progress Notes (Signed)
 05/19/2023 Name: Carol Rangel MRN: [id number] DOB: Nov 13, 1985  Chief Complaint  Patient presents with   Hyperlipidemia    Carol Rangel is a 38 y.o. year old female who presented for a telephone visit.   They were referred to the pharmacist by their PCP for assistance in managing hyperlipidemia.    Subjective: Called patient with interpreter services to discuss hyperlipidemia and statin non-adherence. Patient reports she has been taking atorvastatin consistently since it was restarted. Reports she was only taking a statin for 3 months last year, then it was discontinued.   Care Team: Primary Care Provider: Ivonne Andrew, NP ; Next Scheduled Visit: 10/20/23   Medication Access/Adherence  Current Pharmacy:  [id number] [place], [place] - [address] AT [facility] & [address] [place] [address] Phone: [phone] Fax: [phone]   Patient reports affordability concerns with their medications: No  Patient reports access/transportation concerns to their pharmacy: No  Patient reports adherence concerns with their medications:  No    Fill history verified, patient filled atorvastatin 40mg  for a 90 day supply 10/14/22, and not again until recent fill on 04/24/23 for a 30 day supply. Per order details, the atorvastatin 40mg  prescription from 10/14/22 was written for a quantity of 30 with 2 refills, but not discontinued until 04/17/23 when new rx was sent in.   Hyperlipidemia/ASCVD Risk Reduction  Current lipid lowering medications: atorvastatin 40mg  daily    Family History: Father - Stroke   Current physical activity: patient practice yoga once daily, as directed by PT     PREVENT Risk Score: 10 year risk of CVD: 0.8% - 10 year risk of ASCVD: 0.6% - 10 year risk of HF: 0.2%   Objective:  Lab Results  Component Value Date   HGBA1C 5.2 10/14/2022    Lab Results  Component Value Date   CREATININE 0.68 04/16/2023    BUN 7 04/16/2023   NA 140 04/16/2023   K 3.9 04/16/2023   CL 104 04/16/2023   CO2 21 04/16/2023    Lab Results  Component Value Date   CHOL 215 (H) 04/16/2023   HDL 37 (L) 04/16/2023   LDLCALC 145 (H) 04/16/2023   TRIG 181 (H) 04/16/2023   CHOLHDL 5.8 (H) 04/16/2023    Medications Reviewed Today     Reviewed by Harlon Flor, [facility] (Pharmacist) on 05/19/23 at 1109  Med List Status: <None>   Medication Order Taking? Sig Documenting Provider Last Dose Status Informant  atorvastatin (LIPITOR) 40 MG tablet [id number] Yes Take 1 tablet (40 mg total) by mouth daily. Ivonne Andrew, NP Taking Active   buPROPion (WELLBUTRIN XL) 150 MG 24 hr tablet [id number]  Take 1 tablet (150 mg total) by mouth daily for 14 days. Joanne Gavel, MD  Expired 04/16/23 2359   buPROPion (WELLBUTRIN XL) 300 MG 24 hr tablet [id number] Yes Take 1 tablet (300 mg total) by mouth daily. Joanne Gavel, MD Taking Active   meclizine (ANTIVERT) 12.5 MG tablet [id number] Yes Take 1 tablet (12.5 mg total) by mouth 3 (three) times daily as needed. Ivonne Andrew, NP Taking Active   norethindrone (MICRONOR) 0.35 MG tablet [id number] Yes Take 1 tablet (0.35 mg total) by mouth daily. Take for 21 days, then stop for 7 days to have a period. Repeat monthly Joanne Gavel, MD Taking Active   omega-3 acid ethyl esters (LOVAZA) 1 g capsule [id number]  Take 2 capsules (2  g total) by mouth daily.  Patient not taking: Reported on 04/16/2023   Ivonne Andrew, NP  Expired 01/12/23 2359               Assessment/Plan:   Hyperlipidemia/ASCVD Risk Reduction: - Currently uncontrolled. Last LDL 145 mg/dL - Reviewed long term complications of uncontrolled cholesterol - Reviewed dietary recommendations including consuming more fruits and vegetables, less fried foods. - Reviewed lifestyle recommendations including increasing cardio activity if able and cleared by PT. - Recommend to taking atorvastatin 40mg  daily as  prescribed. Patient verbalized understanding.     Follow Up Plan: Will follow-up in 3 months to see if patient remains adherent. Next PCP appt in August where patient will likely get updated bloodwork.  Harlon Flor, PharmD Clinical Pharmacist  [phone]

## 2023-05-20 ENCOUNTER — Ambulatory Visit: Payer: Medicaid Other | Attending: Obstetrics and Gynecology

## 2023-05-20 DIAGNOSIS — M62838 Other muscle spasm: Secondary | ICD-10-CM | POA: Insufficient documentation

## 2023-05-20 DIAGNOSIS — M6281 Muscle weakness (generalized): Secondary | ICD-10-CM | POA: Insufficient documentation

## 2023-05-20 DIAGNOSIS — R293 Abnormal posture: Secondary | ICD-10-CM | POA: Insufficient documentation

## 2023-05-20 DIAGNOSIS — M5459 Other low back pain: Secondary | ICD-10-CM | POA: Diagnosis present

## 2023-05-20 DIAGNOSIS — R279 Unspecified lack of coordination: Secondary | ICD-10-CM | POA: Diagnosis present

## 2023-05-20 NOTE — Therapy (Signed)
 OUTPATIENT PHYSICAL THERAPY FEMALE PELVIC TREATMENT   Patient Name: Carol Rangel MRN: [id number] DOB:[birth year], 38 y.o., female Today's Date: 05/20/2023  END OF SESSION:  PT End of Session - 05/20/23 0846     Visit Number 2    Date for PT Re-Evaluation 10/28/23    Authorization Type [employer] Medicaid    Authorization - Visit Number 2    Authorization - Number of Visits 27    PT Start Time 0845    PT Stop Time 0925    PT Time Calculation (min) 40 min    Activity Tolerance Patient tolerated treatment well    Behavior During Therapy [facility] for tasks assessed/performed             Past Medical History:  Diagnosis Date   Gestational diabetes    Medical history non-contributory    Nausea with vomiting 12/20/2013   Sciatica of right side 09/24/2019   Past Surgical History:  Procedure Laterality Date   BREAST SURGERY Right    ?abscess surgery x2   Patient Active Problem List   Diagnosis Date Noted   Dysmenorrhea 02/18/2023   Thyroid disorder screen 10/14/2022   Nexplanon insertion 05/17/2021   Anemia 11/09/2020   Supervision of other high risk pregnancy, antepartum 10/11/2020   Gestational diabetes 10/11/2020   Short interval between pregnancies affecting pregnancy, antepartum 10/11/2020   AMA (advanced maternal age) multigravida 35+ 10/11/2020   Language barrier affecting health care 10/28/2019   Sciatica of right side 09/24/2019    PCP: Ivonne Andrew, NP  REFERRING PROVIDER: Joanne Gavel, MD   REFERRING DIAG: N81.4 (ICD-10-CM) - Uterine prolapse  THERAPY DIAG:  Other muscle spasm  Muscle weakness (generalized)  Unspecified lack of coordination  Abnormal posture  Other low back pain  Rationale for Evaluation and Treatment: Rehabilitation  ONSET DATE: 3 years  SUBJECTIVE:                                                                                                                                                                                            SUBJECTIVE STATEMENT: Pt states that she feels like double voiding was very helpful. Urge drill was helpful some of the time. She feels like the other exercises were helpful in allowing her to relax.   PAIN:  Are you having pain? Yes NPRS scale: 5/10 Pain location:  low back  Pain type: aching and tight Pain description: intermittent   Aggravating factors: lifting Relieving factors: unsure  PRECAUTIONS: None  RED FLAGS: None   WEIGHT BEARING RESTRICTIONS: No  FALLS:  Has patient fallen in last 6 months? No  OCCUPATION: not working  ACTIVITY LEVEL : does some kegels   PLOF: Independent  PATIENT GOALS: to get better bladder control  PERTINENT HISTORY:  2 vaginal deliveries    BOWEL MOVEMENT: Pain with bowel movement: No Type of bowel movement:Frequency 1-2x/day and Strain none Fully empty rectum: Yes:   Leakage: No Pads: No Fiber supplement/laxative No  URINATION: Pain with urination: No Fully empty bladder: Yes: she does have post void dribbling  Stream: Strong Urgency: Yes  Frequency: unsure; not waking up at night Leakage: Urge to void, Walking to the bathroom, Coughing, Sneezing, and Laughing Pads: Yes: 2, will have to wear a diaper if she is leaking a lot  INTERCOURSE:  Ability to have vaginal penetration  yes, but she does not feel any desire to have intercourse right now Pain with intercourse: none DrynessNo Climax: - Marinoff Scale: 0/3   PREGNANCY: Vaginal deliveries 2 Tearing Yes:   Episiotomy No C-section deliveries 0 Currently pregnant No  PROLAPSE: Pressure and Bulge   OBJECTIVE:  Note: Objective measures were completed at Evaluation unless otherwise noted. 05/13/23:  PATIENT SURVEYS:   PFIQ-7: 39  COGNITION: Overall cognitive status: Within functional limits for tasks assessed     SENSATION: Light touch: Appears intact  FUNCTIONAL TESTS:  Squat: Rt weight shift, almost leaked Single leg stance:  Rt: Large pelvic  drop  Lt: No pelvic drop, but twisting to the Lt Curl-up test: mild distortion   GAIT: Assistive device utilized: None Comments: WNL  POSTURE: rounded shoulders, forward head, increased thoracic kyphosis, posterior pelvic tilt, and Rt posterior rotation   PALPATION:   General: WNL  Pelvic Alignment: Rt posterior rotation  Abdominal: Apical breathing pattern; tenderness in Lt lower quadrant; bracing in bil obliques                External Perineal Exam: WNL                             Internal Pelvic Floor: perineal scar tissue restriction; pain and tenderness at posterior fourchette and throughout superficial and deep Rt pelvic floor muscles; pain and tenderness in posterior deep Lt levator ani  Patient confirms identification and approves PT to assess internal pelvic floor and treatment Yes  PELVIC MMT:   MMT eval  Vaginal 2/5, no endurance, 4 repeat contractions   Diastasis Recti 1.5 finger width separation superior to umbilicus and 1 finger width inferior to umbilicus  (Blank rows = not tested)        TONE: High, Rt  PROLAPSE: Grade 2 lower anterior vaginal wall/urethral laxity  TODAY'S TREATMENT:                                                                                                                              DATE:  05/20/23 Manual: Pt provides verbal consent for internal vaginal/rectal pelvic floor exam. Posterior fourchette myofascial release Rt superficial and deep pelvic floor muscle release Neuromuscular  re-education: Diaphragmatic breathing for pelvic floor muscle lengthening and relaxation using multimodal cues to help improve  Therapeutic activities: Vibrator use for improved sensation and arousal Inverted lying position to help improve vaginal pressure   05/13/23  EVAL Neuromuscular re-education: Pt provides verbal consent for internal vaginal/rectal pelvic floor exam. Internal vaginal pelvic floor muscle contraction training Quick flicks/  A/ROM with emphasis on relaxation Diaphragmatic breathing Cat cow 10x Child's pose 10 breaths Lower trunk rotation 10x Therapeutic activities: Double voiding Urge drill    PATIENT EDUCATION:  Education details: See above Person educated: Patient Education method: Explanation, Demonstration, Tactile cues, Verbal cues, and Handouts Education comprehension: verbalized understanding  HOME EXERCISE PROGRAM: [id number]  ASSESSMENT:  CLINICAL IMPRESSION: Pt saw some improvements with urinary control over the last week with use of double voiding and urge drill. We discussed inverted lying position to help with vaginal pressure. We went over benefit of vibrator to perineum and what type would be most beneficial right now to help with circulation and arousal. Pelvic floor muscle manual techniques performed with good tolerance, focusing mostly on the Rt side. She demonstrated good improvements in release today. She will continue to benefit from skilled PT intervention in order to decrease urinary incontinence, improve low back pain, correct abdominal pressure management, and begin/progress functional strengthening program.   OBJECTIVE IMPAIRMENTS: decreased activity tolerance, decreased coordination, decreased endurance, decreased mobility, decreased ROM, decreased strength, increased fascial restrictions, increased muscle spasms, impaired tone, postural dysfunction, and pain.   ACTIVITY LIMITATIONS: continence  PARTICIPATION LIMITATIONS: cleaning, laundry, interpersonal relationship, and community activity  PERSONAL FACTORS: 1 comorbidity: medical history  are also affecting patient's functional outcome.   REHAB POTENTIAL: Good  CLINICAL DECISION MAKING: Stable/uncomplicated  EVALUATION COMPLEXITY: Low   GOALS: Goals reviewed with patient? Yes  SHORT TERM GOALS: Target date: 06/10/2023   Pt will be independent with HEP.   Baseline: Goal status: INITIAL  2.  Pt will be  independent with the knack, urge suppression technique, and double voiding in order to improve bladder habits and decrease urinary incontinence.   Baseline:  Goal status: INITIAL  3.  Pt will be able to correctly perform diaphragmatic breathing and appropriate pressure management in order to prevent worsening vaginal wall laxity and improve pelvic floor A/ROM.   Baseline:  Goal status: INITIAL  4.  Pt will report 25% improvement in urinary incontinence.  Baseline:  Goal status: INITIAL  5.  Pt will report no pain or increased tone with palpation of Rt superficial/deep pelvic floor muscles.  Baseline:  Goal status: INITIAL   LONG TERM GOALS: Target date: 10/28/23  Pt will be independent with advanced HEP.   Baseline:  Goal status: INITIAL  2.  Pt will report 75% improvement in urinary incontinence.  Baseline:  Goal status: INITIAL  3.  Pt will report improvement in PFIQ-7 to less than 40 to demonstrate functional improvements.  Baseline:  Goal status: INITIAL  4.  Pt will increase pelvic floor muscle strength to at least 3/5 with appropriate relaxation.  Baseline:  Goal status: INITIAL  5.  Pt will be able to go 2-3 hours in between voids without urgency or incontinence in order to improve QOL and perform all functional activities with less difficulty.   Baseline:  Goal status: INITIAL   PLAN:  PT FREQUENCY: 1-2x/week  PT DURATION: 6 months  PLANNED INTERVENTIONS: 97110-Therapeutic exercises, 97530- Therapeutic activity, [id number]- Neuromuscular re-education, 97535- Self Care, 32440- Manual therapy, Dry Needling, and Biofeedback  PLAN FOR NEXT SESSION: Pelvic  floor muscle release to tolerance; progress down training; begin core strengthening.    Julio Alm, PT, DPT03/04/258:47 AM

## 2023-05-20 NOTE — Patient Instructions (Signed)
   The first picture shows that there is no effect on the pelvic floor with gravity eliminated. The next three show that with a wedge pillow or a few pillows from home under your pelvis the pelvic floor is inverted and may relax and allows gravity to help return prolapsed areas more inward to help relieve symptoms. Do this 15-20 mins every evening when symptoms tend to be worse. Stop if you have pain or negative symptoms.

## 2023-05-27 ENCOUNTER — Ambulatory Visit: Payer: Medicaid Other

## 2023-05-27 DIAGNOSIS — M6281 Muscle weakness (generalized): Secondary | ICD-10-CM

## 2023-05-27 DIAGNOSIS — M62838 Other muscle spasm: Secondary | ICD-10-CM

## 2023-05-27 DIAGNOSIS — M5459 Other low back pain: Secondary | ICD-10-CM

## 2023-05-27 DIAGNOSIS — R279 Unspecified lack of coordination: Secondary | ICD-10-CM

## 2023-05-27 DIAGNOSIS — R293 Abnormal posture: Secondary | ICD-10-CM

## 2023-05-27 NOTE — Therapy (Signed)
 OUTPATIENT PHYSICAL THERAPY FEMALE PELVIC TREATMENT   Patient Name: Carol Rangel MRN: [id number] DOB:1985/04/23, 38 y.o., female Today's Date: 05/27/2023  END OF SESSION:  PT End of Session - 05/27/23 0849     Visit Number 3    Date for PT Re-Evaluation 10/28/23    Authorization Type [employer]    Authorization - Visit Number 3    Authorization - Number of Visits 27    PT Start Time 0845    PT Stop Time 0925    PT Time Calculation (min) 40 min    Activity Tolerance Patient tolerated treatment well    Behavior During Therapy [facility] for tasks assessed/performed             Past Medical History:  Diagnosis Date   Gestational diabetes    Medical history non-contributory    Nausea with vomiting 12/20/2013   Sciatica of right side 09/24/2019   Past Surgical History:  Procedure Laterality Date   BREAST SURGERY Right    ?abscess surgery x2   Patient Active Problem List   Diagnosis Date Noted   Dysmenorrhea 02/18/2023   Thyroid disorder screen 10/14/2022   Nexplanon insertion 05/17/2021   Anemia 11/09/2020   Supervision of other high risk pregnancy, antepartum 10/11/2020   Gestational diabetes 10/11/2020   Short interval between pregnancies affecting pregnancy, antepartum 10/11/2020   AMA (advanced maternal age) multigravida 35+ 10/11/2020   Language barrier affecting health care 10/28/2019   Sciatica of right side 09/24/2019    PCP: Ivonne Andrew, NP  REFERRING PROVIDER: Joanne Gavel, MD   REFERRING DIAG: N81.4 (ICD-10-CM) - Uterine prolapse  THERAPY DIAG:  Other muscle spasm  Muscle weakness (generalized)  Unspecified lack of coordination  Abnormal posture  Other low back pain  Rationale for Evaluation and Treatment: Rehabilitation  ONSET DATE: 3 years  SUBJECTIVE:                                                                                                                                                                                            SUBJECTIVE STATEMENT: Pt states that she was sore for about a day after last session. She feels like her pelvic floor is getting stronger.   PAIN: 05/27/23 Are you having pain? Yes NPRS scale: 0/10 Pain location:  low back  Pain type: aching and tight Pain description: intermittent   Aggravating factors: lifting Relieving factors: unsure  PRECAUTIONS: None  RED FLAGS: None   WEIGHT BEARING RESTRICTIONS: No  FALLS:  Has patient fallen in last 6 months? No  OCCUPATION: not working  ACTIVITY LEVEL : does some kegels   PLOF:  Independent  PATIENT GOALS: to get better bladder control  PERTINENT HISTORY:  2 vaginal deliveries    BOWEL MOVEMENT: Pain with bowel movement: No Type of bowel movement:Frequency 1-2x/day and Strain none Fully empty rectum: Yes:   Leakage: No Pads: No Fiber supplement/laxative No  URINATION: Pain with urination: No Fully empty bladder: Yes: she does have post void dribbling  Stream: Strong Urgency: Yes  Frequency: unsure; not waking up at night Leakage: Urge to void, Walking to the bathroom, Coughing, Sneezing, and Laughing Pads: Yes: 2, will have to wear a diaper if she is leaking a lot  INTERCOURSE:  Ability to have vaginal penetration  yes, but she does not feel any desire to have intercourse right now Pain with intercourse: none DrynessNo Climax: - Marinoff Scale: 0/3   PREGNANCY: Vaginal deliveries 2 Tearing Yes:   Episiotomy No C-section deliveries 0 Currently pregnant No  PROLAPSE: Pressure and Bulge   OBJECTIVE:  Note: Objective measures were completed at Evaluation unless otherwise noted. 05/13/23:  PATIENT SURVEYS:   PFIQ-7: 53  COGNITION: Overall cognitive status: Within functional limits for tasks assessed     SENSATION: Light touch: Appears intact  FUNCTIONAL TESTS:  Squat: Rt weight shift, almost leaked Single leg stance:  Rt: Large pelvic drop  Lt: No pelvic drop, but twisting to the  Lt Curl-up test: mild distortion   GAIT: Assistive device utilized: None Comments: WNL  POSTURE: rounded shoulders, forward head, increased thoracic kyphosis, posterior pelvic tilt, and Rt posterior rotation   PALPATION:   General: WNL  Pelvic Alignment: Rt posterior rotation  Abdominal: Apical breathing pattern; tenderness in Lt lower quadrant; bracing in bil obliques                External Perineal Exam: WNL                             Internal Pelvic Floor: perineal scar tissue restriction; pain and tenderness at posterior fourchette and throughout superficial and deep Rt pelvic floor muscles; pain and tenderness in posterior deep Lt levator ani  Patient confirms identification and approves PT to assess internal pelvic floor and treatment Yes  PELVIC MMT:   MMT eval  Vaginal 2/5, no endurance, 4 repeat contractions   Diastasis Recti 1.5 finger width separation superior to umbilicus and 1 finger width inferior to umbilicus  (Blank rows = not tested)        TONE: High, Rt  PROLAPSE: Grade 2 lower anterior vaginal wall/urethral laxity  TODAY'S TREATMENT:                                                                                                                              DATE:  05/26/23 Neuromuscular re-education: Transversus abdominus training with multimodal cues for improved motor control and breath coordination Bil supine UE ball press with transversus abdominus and pelvic floor muscle contractions and breath coordination 10x Supine  hip adduction ball press with transversus abdominus and pelvic floor muscle contractions and breath coordination 10x Bridge with hip adduction, transversus abdominus, and pelvic floor muscle 2 x 10 Seated hip adduction ball press with transversus abdominus and pelvic floor muscle 2 x 10 Seated hip abduction red band with transversus abdominus and pelvic floor muscle 2 x 10 Seated resisted march red band with transversus abdominus  and pelvic floor muscle 2 x 10 Unilateral side lying UE ball press with transversus abdominus and pelvic floor muscle contractions and breath coordination 10x Side lying clam shell with unilateral UE ball press with transversus abdominus and pelvic floor muscle 2 x 10 Exercises: Lower trunk rotation 2 x 10 Single knee to chest 5x bil Seated forward fold 10 breaths   05/20/23 Manual: Pt provides verbal consent for internal vaginal/rectal pelvic floor exam. Posterior fourchette myofascial release Rt superficial and deep pelvic floor muscle release Neuromuscular re-education: Diaphragmatic breathing for pelvic floor muscle lengthening and relaxation using multimodal cues to help improve  Therapeutic activities: Vibrator use for improved sensation and arousal Inverted lying position to help improve vaginal pressure   05/13/23  EVAL Neuromuscular re-education: Pt provides verbal consent for internal vaginal/rectal pelvic floor exam. Internal vaginal pelvic floor muscle contraction training Quick flicks/ A/ROM with emphasis on relaxation Diaphragmatic breathing Cat cow 10x Child's pose 10 breaths Lower trunk rotation 10x Therapeutic activities: Double voiding Urge drill    PATIENT EDUCATION:  Education details: See above Person educated: Patient Education method: Explanation, Demonstration, Tactile cues, Verbal cues, and Handouts Education comprehension: verbalized understanding  HOME EXERCISE PROGRAM: [id number]  ASSESSMENT:  CLINICAL IMPRESSION: Pt is feeling like her pelvic floor is stronger. Even though she did not notice any direct improvement after last session, believe releasing tension out of these muscles is helping them get a better contraction when needed and during deep core activities today. She did not notice any improvement in arousal with the vibrator, but she was encouraged to continue to use regularly to increase blood flow and stimulate nerves. She did very  well with core training; initially she was bearing dow nto activate transversus abdominus, but was able to quickly correct with multimodal cues. She did well with progressions into more challenging activities. She will continue to benefit from skilled PT intervention in order to decrease urinary incontinence, improve low back pain, correct abdominal pressure management, and begin/progress functional strengthening program.   OBJECTIVE IMPAIRMENTS: decreased activity tolerance, decreased coordination, decreased endurance, decreased mobility, decreased ROM, decreased strength, increased fascial restrictions, increased muscle spasms, impaired tone, postural dysfunction, and pain.   ACTIVITY LIMITATIONS: continence  PARTICIPATION LIMITATIONS: cleaning, laundry, interpersonal relationship, and community activity  PERSONAL FACTORS: 1 comorbidity: medical history  are also affecting patient's functional outcome.   REHAB POTENTIAL: Good  CLINICAL DECISION MAKING: Stable/uncomplicated  EVALUATION COMPLEXITY: Low   GOALS: Goals reviewed with patient? Yes  SHORT TERM GOALS: Target date: 06/10/2023   Pt will be independent with HEP.   Baseline: Goal status: INITIAL  2.  Pt will be independent with the knack, urge suppression technique, and double voiding in order to improve bladder habits and decrease urinary incontinence.   Baseline:  Goal status: INITIAL  3.  Pt will be able to correctly perform diaphragmatic breathing and appropriate pressure management in order to prevent worsening vaginal wall laxity and improve pelvic floor A/ROM.   Baseline:  Goal status: INITIAL  4.  Pt will report 25% improvement in urinary incontinence.  Baseline:  Goal status: INITIAL  5.  Pt will report no pain or increased tone with palpation of Rt superficial/deep pelvic floor muscles.  Baseline:  Goal status: INITIAL   LONG TERM GOALS: Target date: 10/28/23  Pt will be independent with advanced HEP.    Baseline:  Goal status: INITIAL  2.  Pt will report 75% improvement in urinary incontinence.  Baseline:  Goal status: INITIAL  3.  Pt will report improvement in PFIQ-7 to less than 40 to demonstrate functional improvements.  Baseline:  Goal status: INITIAL  4.  Pt will increase pelvic floor muscle strength to at least 3/5 with appropriate relaxation.  Baseline:  Goal status: INITIAL  5.  Pt will be able to go 2-3 hours in between voids without urgency or incontinence in order to improve QOL and perform all functional activities with less difficulty.   Baseline:  Goal status: INITIAL   PLAN:  PT FREQUENCY: 1-2x/week  PT DURATION: 6 months  PLANNED INTERVENTIONS: 97110-Therapeutic exercises, 97530- Therapeutic activity, [id number]- Neuromuscular re-education, 97535- Self Care, [id number]- Manual therapy, Dry Needling, and Biofeedback  PLAN FOR NEXT SESSION: Pelvic floor muscle release to tolerance; progress down training; begin core strengthening.    Julio Alm, PT, DPT03/11/259:23 AM

## 2023-06-03 ENCOUNTER — Ambulatory Visit: Payer: Medicaid Other

## 2023-06-03 DIAGNOSIS — M62838 Other muscle spasm: Secondary | ICD-10-CM

## 2023-06-03 DIAGNOSIS — M6281 Muscle weakness (generalized): Secondary | ICD-10-CM

## 2023-06-03 DIAGNOSIS — R293 Abnormal posture: Secondary | ICD-10-CM

## 2023-06-03 DIAGNOSIS — M5459 Other low back pain: Secondary | ICD-10-CM

## 2023-06-03 DIAGNOSIS — R279 Unspecified lack of coordination: Secondary | ICD-10-CM

## 2023-06-03 NOTE — Therapy (Signed)
 OUTPATIENT PHYSICAL THERAPY FEMALE PELVIC TREATMENT   Patient Name: Carol Rangel MRN: [id number] DOB:Jul 25, 1985, 38 y.o., female Today's Date: 06/03/2023  END OF SESSION:  PT End of Session - 06/03/23 0844     Visit Number 4    Date for PT Re-Evaluation 10/28/23    Authorization Type [employer] Medicaid    Authorization - Visit Number 4    Authorization - Number of Visits 27    PT Start Time 0845    PT Stop Time 0925    PT Time Calculation (min) 40 min    Activity Tolerance Patient tolerated treatment well    Behavior During Therapy [facility] for tasks assessed/performed             Past Medical History:  Diagnosis Date   Gestational diabetes    Medical history non-contributory    Nausea with vomiting 12/20/2013   Sciatica of right side 09/24/2019   Past Surgical History:  Procedure Laterality Date   BREAST SURGERY Right    ?abscess surgery x2   Patient Active Problem List   Diagnosis Date Noted   Dysmenorrhea 02/18/2023   Thyroid disorder screen 10/14/2022   Nexplanon insertion 05/17/2021   Anemia 11/09/2020   Supervision of other high risk pregnancy, antepartum 10/11/2020   Gestational diabetes 10/11/2020   Short interval between pregnancies affecting pregnancy, antepartum 10/11/2020   AMA (advanced maternal age) multigravida 35+ 10/11/2020   Language barrier affecting health care 10/28/2019   Sciatica of right side 09/24/2019    PCP: Ivonne Andrew, NP  REFERRING PROVIDER: Joanne Gavel, MD   REFERRING DIAG: N81.4 (ICD-10-CM) - Uterine prolapse  THERAPY DIAG:  Other muscle spasm  Muscle weakness (generalized)  Unspecified lack of coordination  Abnormal posture  Other low back pain  Rationale for Evaluation and Treatment: Rehabilitation  ONSET DATE: 3 years  SUBJECTIVE:                                                                                                                                                                                            SUBJECTIVE STATEMENT: Pt states that she did well after last session. She is trying to get to exercises regularly at home. She states that she notices more leaking when she is tired.   PAIN: 06/03/23 Are you having pain? Yes NPRS scale: 0/10 Pain location:  low back  Pain type: aching and tight Pain description: intermittent   Aggravating factors: lifting Relieving factors: unsure  PRECAUTIONS: None  RED FLAGS: None   WEIGHT BEARING RESTRICTIONS: No  FALLS:  Has patient fallen in last 6 months? No  OCCUPATION: not working  ACTIVITY  LEVEL : does some kegels   PLOF: Independent  PATIENT GOALS: to get better bladder control  PERTINENT HISTORY:  2 vaginal deliveries    BOWEL MOVEMENT: Pain with bowel movement: No Type of bowel movement:Frequency 1-2x/day and Strain none Fully empty rectum: Yes:   Leakage: No Pads: No Fiber supplement/laxative No  URINATION: Pain with urination: No Fully empty bladder: Yes: she does have post void dribbling  Stream: Strong Urgency: Yes  Frequency: unsure; not waking up at night Leakage: Urge to void, Walking to the bathroom, Coughing, Sneezing, and Laughing Pads: Yes: 2, will have to wear a diaper if she is leaking a lot  INTERCOURSE:  Ability to have vaginal penetration  yes, but she does not feel any desire to have intercourse right now Pain with intercourse: none DrynessNo Climax: - Marinoff Scale: 0/3   PREGNANCY: Vaginal deliveries 2 Tearing Yes:   Episiotomy No C-section deliveries 0 Currently pregnant No  PROLAPSE: Pressure and Bulge   OBJECTIVE:  Note: Objective measures were completed at Evaluation unless otherwise noted. 05/13/23:  PATIENT SURVEYS:   PFIQ-7: 58  COGNITION: Overall cognitive status: Within functional limits for tasks assessed     SENSATION: Light touch: Appears intact  FUNCTIONAL TESTS:  Squat: Rt weight shift, almost leaked Single leg stance:  Rt: Large pelvic drop  Lt:  No pelvic drop, but twisting to the Lt Curl-up test: mild distortion   GAIT: Assistive device utilized: None Comments: WNL  POSTURE: rounded shoulders, forward head, increased thoracic kyphosis, posterior pelvic tilt, and Rt posterior rotation   PALPATION:   General: WNL  Pelvic Alignment: Rt posterior rotation  Abdominal: Apical breathing pattern; tenderness in Lt lower quadrant; bracing in bil obliques                External Perineal Exam: WNL                             Internal Pelvic Floor: perineal scar tissue restriction; pain and tenderness at posterior fourchette and throughout superficial and deep Rt pelvic floor muscles; pain and tenderness in posterior deep Lt levator ani  Patient confirms identification and approves PT to assess internal pelvic floor and treatment Yes  PELVIC MMT:   MMT eval  Vaginal 2/5, no endurance, 4 repeat contractions   Diastasis Recti 1.5 finger width separation superior to umbilicus and 1 finger width inferior to umbilicus  (Blank rows = not tested)        TONE: High, Rt  PROLAPSE: Grade 2 lower anterior vaginal wall/urethral laxity  TODAY'S TREATMENT:                                                                                                                              DATE:  06/03/23 Neuromuscular re-education: Bridge with hip adduction, transversus abdominus, and pelvic floor muscle 2 x 10 Modified dead bug 10x bil Bird dog 12x Seated resisted  march red band with transversus abdominus and pelvic floor muscle 2 x 10 Exercises: Cat cow 2 x 10 Child's pose 10 breaths Thread the needle 5x bil Seated forward fold 10 breaths Therapeutic activities: Squats to table red band around thighs Standing shoulder extensions red band 3 x 10   05/26/23 Neuromuscular re-education: Transversus abdominus training with multimodal cues for improved motor control and breath coordination Bil supine UE ball press with transversus  abdominus and pelvic floor muscle contractions and breath coordination 10x Supine hip adduction ball press with transversus abdominus and pelvic floor muscle contractions and breath coordination 10x Bridge with hip adduction, transversus abdominus, and pelvic floor muscle 2 x 10 Seated hip adduction ball press with transversus abdominus and pelvic floor muscle 2 x 10 Seated hip abduction red band with transversus abdominus and pelvic floor muscle 2 x 10 Seated resisted march red band with transversus abdominus and pelvic floor muscle 2 x 10 Unilateral side lying UE ball press with transversus abdominus and pelvic floor muscle contractions and breath coordination 10x Side lying clam shell with unilateral UE ball press with transversus abdominus and pelvic floor muscle 2 x 10 Exercises: Lower trunk rotation 2 x 10 Single knee to chest 5x bil Seated forward fold 10 breaths   05/20/23 Manual: Pt provides verbal consent for internal vaginal/rectal pelvic floor exam. Posterior fourchette myofascial release Rt superficial and deep pelvic floor muscle release Neuromuscular re-education: Diaphragmatic breathing for pelvic floor muscle lengthening and relaxation using multimodal cues to help improve  Therapeutic activities: Vibrator use for improved sensation and arousal Inverted lying position to help improve vaginal pressure    PATIENT EDUCATION:  Education details: See above Person educated: Patient Education method: Programmer, multimedia, Demonstration, Actor cues, Verbal cues, and Handouts Education comprehension: verbalized understanding  HOME EXERCISE PROGRAM: [id number]  ASSESSMENT:  CLINICAL IMPRESSION: Pt has been working on [employer], reporting that she is starting to feel stronger. However, she notices more urinary incontinence when she is tired. We discussed that this is very normal due to not getting the amount of rest and recovery that is optimal. She did very well with core strengthening  progressions today. She is demonstrating improvements in strength, but did fatigue and have to take rest breaks with more challenging exercises like dead bug. When she was performing standing shoulder extensions, she had episode of urinary incontinence. We discussed that this may mean she is bearing down on bladder instead of drawing up and in. She will continue to benefit from skilled PT intervention in order to decrease urinary incontinence, improve low back pain, correct abdominal pressure management, and begin/progress functional strengthening program.   OBJECTIVE IMPAIRMENTS: decreased activity tolerance, decreased coordination, decreased endurance, decreased mobility, decreased ROM, decreased strength, increased fascial restrictions, increased muscle spasms, impaired tone, postural dysfunction, and pain.   ACTIVITY LIMITATIONS: continence  PARTICIPATION LIMITATIONS: cleaning, laundry, interpersonal relationship, and community activity  PERSONAL FACTORS: 1 comorbidity: medical history  are also affecting patient's functional outcome.   REHAB POTENTIAL: Good  CLINICAL DECISION MAKING: Stable/uncomplicated  EVALUATION COMPLEXITY: Low   GOALS: Goals reviewed with patient? Yes  SHORT TERM GOALS: Target date: 06/10/2023   Pt will be independent with HEP.   Baseline: Goal status: INITIAL  2.  Pt will be independent with the knack, urge suppression technique, and double voiding in order to improve bladder habits and decrease urinary incontinence.   Baseline:  Goal status: INITIAL  3.  Pt will be able to correctly perform diaphragmatic breathing and appropriate pressure management in  order to prevent worsening vaginal wall laxity and improve pelvic floor A/ROM.   Baseline:  Goal status: INITIAL  4.  Pt will report 25% improvement in urinary incontinence.  Baseline:  Goal status: INITIAL  5.  Pt will report no pain or increased tone with palpation of Rt superficial/deep pelvic  floor muscles.  Baseline:  Goal status: INITIAL   LONG TERM GOALS: Target date: 10/28/23  Pt will be independent with advanced HEP.   Baseline:  Goal status: INITIAL  2.  Pt will report 75% improvement in urinary incontinence.  Baseline:  Goal status: INITIAL  3.  Pt will report improvement in PFIQ-7 to less than 40 to demonstrate functional improvements.  Baseline:  Goal status: INITIAL  4.  Pt will increase pelvic floor muscle strength to at least 3/5 with appropriate relaxation.  Baseline:  Goal status: INITIAL  5.  Pt will be able to go 2-3 hours in between voids without urgency or incontinence in order to improve QOL and perform all functional activities with less difficulty.   Baseline:  Goal status: INITIAL   PLAN:  PT FREQUENCY: 1-2x/week  PT DURATION: 6 months  PLANNED INTERVENTIONS: 97110-Therapeutic exercises, 97530- Therapeutic activity, [id number]- Neuromuscular re-education, 97535- Self Care, [id number]- Manual therapy, Dry Needling, and Biofeedback  PLAN FOR NEXT SESSION: Pelvic floor muscle release to tolerance; progress down training; begin core strengthening. Address goals    Julio Alm, PT, DPT03/18/259:26 AM

## 2023-07-07 ENCOUNTER — Ambulatory Visit: Payer: Medicaid Other | Attending: Obstetrics and Gynecology

## 2023-07-07 DIAGNOSIS — R293 Abnormal posture: Secondary | ICD-10-CM | POA: Insufficient documentation

## 2023-07-07 DIAGNOSIS — R279 Unspecified lack of coordination: Secondary | ICD-10-CM | POA: Insufficient documentation

## 2023-07-07 DIAGNOSIS — M6281 Muscle weakness (generalized): Secondary | ICD-10-CM | POA: Insufficient documentation

## 2023-07-07 DIAGNOSIS — M62838 Other muscle spasm: Secondary | ICD-10-CM | POA: Diagnosis present

## 2023-07-07 DIAGNOSIS — M5459 Other low back pain: Secondary | ICD-10-CM | POA: Diagnosis present

## 2023-07-07 NOTE — Therapy (Signed)
 OUTPATIENT PHYSICAL THERAPY FEMALE PELVIC TREATMENT   Patient Name: Carol Rangel MRN: [id number] DOB:1985/03/21, 38 y.o., female Today's Date: 07/07/2023  END OF SESSION:  PT End of Session - 07/07/23 0840     Visit Number 5    Date for PT Re-Evaluation 10/28/23    Authorization Type [employer]    Authorization - Visit Number 5    Authorization - Number of Visits 27    PT Start Time 0845    PT Stop Time 0925    PT Time Calculation (min) 40 min    Activity Tolerance Patient tolerated treatment well    Behavior During Therapy [facility] for tasks assessed/performed             Past Medical History:  Diagnosis Date   Gestational diabetes    Medical history non-contributory    Nausea with vomiting 12/20/2013   Sciatica of right side 09/24/2019   Past Surgical History:  Procedure Laterality Date   BREAST SURGERY Right    ?abscess surgery x2   Patient Active Problem List   Diagnosis Date Noted   Dysmenorrhea 02/18/2023   Thyroid  disorder screen 10/14/2022   Nexplanon  insertion 05/17/2021   Anemia 11/09/2020   Supervision of other high risk pregnancy, antepartum 10/11/2020   Gestational diabetes 10/11/2020   Short interval between pregnancies affecting pregnancy, antepartum 10/11/2020   AMA (advanced maternal age) multigravida 35+ 10/11/2020   Language barrier affecting health care 10/28/2019   Sciatica of right side 09/24/2019    PCP: Jerrlyn Morel, NP  REFERRING PROVIDER: Maud Sorenson, MD   REFERRING DIAG: N81.4 (ICD-10-CM) - Uterine prolapse  THERAPY DIAG:  Other muscle spasm  Muscle weakness (generalized)  Unspecified lack of coordination  Abnormal posture  Other low back pain  Rationale for Evaluation and Treatment: Rehabilitation  ONSET DATE: 3 years  SUBJECTIVE:                                                                                                                                                                                            SUBJECTIVE STATEMENT: Pt states that leaking is better, but she feels a bulge when she is sitting on toilet. She feels like this is about the same from when she first started PT. She thinks low back pain is improving.   PAIN: 07/07/23 Are you having pain? Yes NPRS scale: 0/10 Pain location:  low back  Pain type: aching and tight Pain description: intermittent   Aggravating factors: lifting Relieving factors: unsure  PRECAUTIONS: None  RED FLAGS: None   WEIGHT BEARING RESTRICTIONS: No  FALLS:  Has patient fallen in last 6  months? No  OCCUPATION: not working  ACTIVITY LEVEL : does some kegels   PLOF: Independent  PATIENT GOALS: to get better bladder control  PERTINENT HISTORY:  2 vaginal deliveries    BOWEL MOVEMENT: Pain with bowel movement: No Type of bowel movement:Frequency 1-2x/day and Strain none Fully empty rectum: Yes:   Leakage: No Pads: No Fiber supplement/laxative No  URINATION: Pain with urination: No Fully empty bladder: Yes: she does have post void dribbling  Stream: Strong Urgency: Yes  Frequency: unsure; not waking up at night Leakage: Urge to void, Walking to the bathroom, Coughing, Sneezing, and Laughing Pads: Yes: 2, will have to wear a diaper if she is leaking a lot  INTERCOURSE:  Ability to have vaginal penetration  yes, but she does not feel any desire to have intercourse right now Pain with intercourse: none DrynessNo Climax: - Marinoff Scale: 0/3   PREGNANCY: Vaginal deliveries 2 Tearing Yes:   Episiotomy No C-section deliveries 0 Currently pregnant No  PROLAPSE: Pressure and Bulge   OBJECTIVE:  Note: Objective measures were completed at Evaluation unless otherwise noted. 05/13/23:  PATIENT SURVEYS:   PFIQ-7: 57  COGNITION: Overall cognitive status: Within functional limits for tasks assessed     SENSATION: Light touch: Appears intact  FUNCTIONAL TESTS:  Squat: Rt weight shift, almost leaked Single leg  stance:  Rt: Large pelvic drop  Lt: No pelvic drop, but twisting to the Lt Curl-up test: mild distortion   GAIT: Assistive device utilized: None Comments: WNL  POSTURE: rounded shoulders, forward head, increased thoracic kyphosis, posterior pelvic tilt, and Rt posterior rotation   PALPATION:   General: WNL  Pelvic Alignment: Rt posterior rotation  Abdominal: Apical breathing pattern; tenderness in Lt lower quadrant; bracing in bil obliques                External Perineal Exam: WNL                             Internal Pelvic Floor: perineal scar tissue restriction; pain and tenderness at posterior fourchette and throughout superficial and deep Rt pelvic floor muscles; pain and tenderness in posterior deep Lt levator ani  Patient confirms identification and approves PT to assess internal pelvic floor and treatment Yes  PELVIC MMT:   MMT eval  Vaginal 2/5, no endurance, 4 repeat contractions   Diastasis Recti 1.5 finger width separation superior to umbilicus and 1 finger width inferior to umbilicus  (Blank rows = not tested)        TONE: High, Rt  PROLAPSE: Grade 2 lower anterior vaginal wall/urethral laxity  TODAY'S TREATMENT:                                                                                                                              DATE:  07/07/23 Neuromuscular re-education: Bridge with hip adduction 2 x 10 Bridge with abduction 2 x 10  Bridge  march 2 x 10 Bridge heel raise 2 x 10 Modified side plank with clam shell 2 x 10 Exercises: Lower trunk rotation 2 x 10 Supine piriformis stretch 60 sec bil Open books 10x bil Unilateral chest press in side lying spinal twist 5lbs 15x bil Therapeutic activities: Vaginal moisturizing with coconut oil  Review of inverted lying to help improve pressure management   06/03/23 Neuromuscular re-education: Bridge with hip adduction, transversus abdominus, and pelvic floor muscle 2 x 10 Modified dead bug 10x  bil Bird dog 12x Seated resisted march red band with transversus abdominus and pelvic floor muscle 2 x 10 Exercises: Cat cow 2 x 10 Child's pose 10 breaths Thread the needle 5x bil Seated forward fold 10 breaths Therapeutic activities: Squats to table red band around thighs Standing shoulder extensions red band 3 x 10   05/26/23 Neuromuscular re-education: Transversus abdominus training with multimodal cues for improved motor control and breath coordination Bil supine UE ball press with transversus abdominus and pelvic floor muscle contractions and breath coordination 10x Supine hip adduction ball press with transversus abdominus and pelvic floor muscle contractions and breath coordination 10x Bridge with hip adduction, transversus abdominus, and pelvic floor muscle 2 x 10 Seated hip adduction ball press with transversus abdominus and pelvic floor muscle 2 x 10 Seated hip abduction red band with transversus abdominus and pelvic floor muscle 2 x 10 Seated resisted march red band with transversus abdominus and pelvic floor muscle 2 x 10 Unilateral side lying UE ball press with transversus abdominus and pelvic floor muscle contractions and breath coordination 10x Side lying clam shell with unilateral UE ball press with transversus abdominus and pelvic floor muscle 2 x 10 Exercises: Lower trunk rotation 2 x 10 Single knee to chest 5x bil Seated forward fold 10 breaths    PATIENT EDUCATION:  Education details: See above Person educated: Patient Education method: Programmer, multimedia, Demonstration, Actor cues, Verbal cues, and Handouts Education comprehension: verbalized understanding  HOME EXERCISE PROGRAM: [id number]  ASSESSMENT:  CLINICAL IMPRESSION: Pt doing well seeing decreases in urinary incontinence and improvements in low back pain; however, she feels like the vaginal pressure she has is about the same. We reviewed inverted lying position for symptoms relief and performed many  strengthening activities in bridge position to help use gravity to our advantage. We also worked on spinal mobility and rib cage opening to help improve pressure management and appropriate core activation. She is demonstrating good coordination with core and pelvic floor with breathing, but did require rest breaks with exercises. She will continue to benefit from skilled PT intervention in order to decrease urinary incontinence, improve low back pain, correct abdominal pressure management, and begin/progress functional strengthening program.   OBJECTIVE IMPAIRMENTS: decreased activity tolerance, decreased coordination, decreased endurance, decreased mobility, decreased ROM, decreased strength, increased fascial restrictions, increased muscle spasms, impaired tone, postural dysfunction, and pain.   ACTIVITY LIMITATIONS: continence  PARTICIPATION LIMITATIONS: cleaning, laundry, interpersonal relationship, and community activity  PERSONAL FACTORS: 1 comorbidity: medical history  are also affecting patient's functional outcome.   REHAB POTENTIAL: Good  CLINICAL DECISION MAKING: Stable/uncomplicated  EVALUATION COMPLEXITY: Low   GOALS: Goals reviewed with patient? Yes  SHORT TERM GOALS: Target date: 06/10/2023 - updated 07/07/23   Pt will be independent with HEP.   Baseline: Goal status: MET 07/07/23  2.  Pt will be independent with the knack, urge suppression technique, and double voiding in order to improve bladder habits and decrease urinary incontinence.   Baseline:  Goal status: MET  07/07/23  3.  Pt will be able to correctly perform diaphragmatic breathing and appropriate pressure management in order to prevent worsening vaginal wall laxity and improve pelvic floor A/ROM.   Baseline:  Goal status: MET 07/07/23  4.  Pt will report 25% improvement in urinary incontinence.  Baseline:  Goal status: MET 07/07/23  5.  Pt will report no pain or increased tone with palpation of Rt  superficial/deep pelvic floor muscles.  Baseline:  Goal status: MET 07/07/23   LONG TERM GOALS: Target date: 10/28/23 - updated 07/07/23  Pt will be independent with advanced HEP.   Baseline:  Goal status: IN PROGRESS 07/07/23  2.  Pt will report 75% improvement in urinary incontinence.  Baseline:  Goal status:  IN PROGRESS 07/07/23  3.  Pt will report improvement in PFIQ-7 to less than 40 to demonstrate functional improvements.  Baseline:  Goal status:  IN PROGRESS 07/07/23  4.  Pt will increase pelvic floor muscle strength to at least 3/5 with appropriate relaxation.  Baseline:  Goal status:  IN PROGRESS 07/07/23  5.  Pt will be able to go 2-3 hours in between voids without urgency or incontinence in order to improve QOL and perform all functional activities with less difficulty.   Baseline:  Goal status:  IN PROGRESS 07/07/23   PLAN:  PT FREQUENCY: 1-2x/week  PT DURATION: 6 months  PLANNED INTERVENTIONS: 97110-Therapeutic exercises, 97530- Therapeutic activity, 97112- Neuromuscular re-education, 97535- Self Care, 16109- Manual therapy, Dry Needling, and Biofeedback  PLAN FOR NEXT SESSION: Pelvic floor muscle release to tolerance; progress down training; begin core strengthening.    Verlena Glenn, PT, DPT04/21/259:24 AM

## 2023-07-14 ENCOUNTER — Ambulatory Visit: Payer: Medicaid Other

## 2023-07-14 DIAGNOSIS — M5459 Other low back pain: Secondary | ICD-10-CM

## 2023-07-14 DIAGNOSIS — M6281 Muscle weakness (generalized): Secondary | ICD-10-CM

## 2023-07-14 DIAGNOSIS — R279 Unspecified lack of coordination: Secondary | ICD-10-CM

## 2023-07-14 DIAGNOSIS — M62838 Other muscle spasm: Secondary | ICD-10-CM | POA: Diagnosis not present

## 2023-07-14 DIAGNOSIS — R293 Abnormal posture: Secondary | ICD-10-CM

## 2023-07-14 NOTE — Therapy (Signed)
 OUTPATIENT PHYSICAL THERAPY FEMALE PELVIC TREATMENT   Patient Name: Carol Rangel MRN: [id number] DOB:May 27, 1985, 38 y.o., female Today's Date: 07/14/2023  END OF SESSION:  PT End of Session - 07/14/23 0845     Visit Number 6    Date for PT Re-Evaluation 10/28/23    Authorization Type [employer] Medicaid    Authorization - Visit Number 6    Authorization - Number of Visits 27    PT Start Time 0845    PT Stop Time 0925    PT Time Calculation (min) 40 min    Activity Tolerance Patient tolerated treatment well    Behavior During Therapy [facility] for tasks assessed/performed             Past Medical History:  Diagnosis Date   Gestational diabetes    Medical history non-contributory    Nausea with vomiting 12/20/2013   Sciatica of right side 09/24/2019   Past Surgical History:  Procedure Laterality Date   BREAST SURGERY Right    ?abscess surgery x2   Patient Active Problem List   Diagnosis Date Noted   Dysmenorrhea 02/18/2023   Thyroid  disorder screen 10/14/2022   Nexplanon  insertion 05/17/2021   Anemia 11/09/2020   Supervision of other high risk pregnancy, antepartum 10/11/2020   Gestational diabetes 10/11/2020   Short interval between pregnancies affecting pregnancy, antepartum 10/11/2020   AMA (advanced maternal age) multigravida 35+ 10/11/2020   Language barrier affecting health care 10/28/2019   Sciatica of right side 09/24/2019    PCP: Jerrlyn Morel, NP  REFERRING PROVIDER: Maud Sorenson, MD   REFERRING DIAG: N81.4 (ICD-10-CM) - Uterine prolapse  THERAPY DIAG:  Other muscle spasm  Muscle weakness (generalized)  Unspecified lack of coordination  Abnormal posture  Other low back pain  Rationale for Evaluation and Treatment: Rehabilitation  ONSET DATE: 3 years  SUBJECTIVE:                                                                                                                                                                                            SUBJECTIVE STATEMENT: She states that low back pain is much better and she is not having issues with leaking. She still has not felt any progress with bulge.   PAIN: 07/07/23 Are you having pain? Yes NPRS scale: 0/10 Pain location:  low back  Pain type: aching and tight Pain description: intermittent   Aggravating factors: lifting Relieving factors: unsure  PRECAUTIONS: None  RED FLAGS: None   WEIGHT BEARING RESTRICTIONS: No  FALLS:  Has patient fallen in last 6 months? No  OCCUPATION: not working  ACTIVITY LEVEL : does some  kegels   PLOF: Independent  PATIENT GOALS: to get better bladder control  PERTINENT HISTORY:  2 vaginal deliveries    BOWEL MOVEMENT: Pain with bowel movement: No Type of bowel movement:Frequency 1-2x/day and Strain none Fully empty rectum: Yes:   Leakage: No Pads: No Fiber supplement/laxative No  URINATION: Pain with urination: No Fully empty bladder: Yes: she does have post void dribbling  Stream: Strong Urgency: Yes  Frequency: unsure; not waking up at night Leakage: Urge to void, Walking to the bathroom, Coughing, Sneezing, and Laughing Pads: Yes: 2, will have to wear a diaper if she is leaking a lot  INTERCOURSE:  Ability to have vaginal penetration  yes, but she does not feel any desire to have intercourse right now Pain with intercourse: none DrynessNo Climax: - Marinoff Scale: 0/3   PREGNANCY: Vaginal deliveries 2 Tearing Yes:   Episiotomy No C-section deliveries 0 Currently pregnant No  PROLAPSE: Pressure and Bulge   OBJECTIVE:  Note: Objective measures were completed at Evaluation unless otherwise noted. 05/13/23:  PATIENT SURVEYS:   PFIQ-7: 44  COGNITION: Overall cognitive status: Within functional limits for tasks assessed     SENSATION: Light touch: Appears intact  FUNCTIONAL TESTS:  Squat: Rt weight shift, almost leaked Single leg stance:  Rt: Large pelvic drop  Lt: No pelvic drop, but  twisting to the Lt Curl-up test: mild distortion   GAIT: Assistive device utilized: None Comments: WNL  POSTURE: rounded shoulders, forward head, increased thoracic kyphosis, posterior pelvic tilt, and Rt posterior rotation   PALPATION:   General: WNL  Pelvic Alignment: Rt posterior rotation  Abdominal: Apical breathing pattern; tenderness in Lt lower quadrant; bracing in bil obliques                External Perineal Exam: WNL                             Internal Pelvic Floor: perineal scar tissue restriction; pain and tenderness at posterior fourchette and throughout superficial and deep Rt pelvic floor muscles; pain and tenderness in posterior deep Lt levator ani  Patient confirms identification and approves PT to assess internal pelvic floor and treatment Yes  PELVIC MMT:   MMT eval  Vaginal 2/5, no endurance, 4 repeat contractions   Diastasis Recti 1.5 finger width separation superior to umbilicus and 1 finger width inferior to umbilicus  (Blank rows = not tested)        TONE: High, Rt  PROLAPSE: Grade 2 lower anterior vaginal wall/urethral laxity  TODAY'S TREATMENT:                                                                                                                              DATE:  07/14/23 Neuromuscular re-education: sEMG biofeedback with electrodes placed on either side of anus and ground lead placed on Rt glutes 20 minutes of of supine quick flicks with exhales in  order to work on addressing abdominal pressure management and appropriate pelvic floor muscle contraction Squats with green band and forceful exhale for improved pelvic floor muscle engagement 3 x 10 Sitting on swiss ball unilateral rows with green band 2 x 10 bil with pelvic floor muscle contraction and forceful exhale Pelvic floor muscle contraction sitting on swiss ball for cues of pulling up and away with forceful exhale   07/07/23 Neuromuscular re-education: Bridge with hip  adduction 2 x 10 Bridge with abduction 2 x 10  Bridge march 2 x 10 Bridge heel raise 2 x 10 Modified side plank with clam shell 2 x 10 Exercises: Lower trunk rotation 2 x 10 Supine piriformis stretch 60 sec bil Open books 10x bil Unilateral chest press in side lying spinal twist 5lbs 15x bil Therapeutic activities: Vaginal moisturizing with coconut oil  Review of inverted lying to help improve pressure management   06/03/23 Neuromuscular re-education: Bridge with hip adduction, transversus abdominus, and pelvic floor muscle 2 x 10 Modified dead bug 10x bil Bird dog 12x Seated resisted march red band with transversus abdominus and pelvic floor muscle 2 x 10 Exercises: Cat cow 2 x 10 Child's pose 10 breaths Thread the needle 5x bil Seated forward fold 10 breaths Therapeutic activities: Squats to table red band around thighs Standing shoulder extensions red band 3 x 10   PATIENT EDUCATION:  Education details: See above Person educated: Patient Education method: Programmer, multimedia, Demonstration, Tactile cues, Verbal cues, and Handouts Education comprehension: verbalized understanding  HOME EXERCISE PROGRAM: [id number]  ASSESSMENT:  CLINICAL IMPRESSION: Pt doing very well with reduced back pain and urinary leaking, but is still feeling vaginal pressure especially squatting or sitting on toilet. Due to this, we performed some specific pelvic floor muscle contractions while sitting on swiss ball to help improve proprioception. We continued this to working with unilateral rows and then in squats. She started having some urinary leaking at the bottom of the squat. Because of continued issues,we hooked pelvic floor muscles up to sEMG biofeedback in order to get more information about what pelvic floor muscles were doing. Demonstrated that pelvic floor muscles were working well, but when she was contracting she was pushing belly out, effectively bearing down on bladder which is why she was  leaking at bottom of her squat. Working with EMG and using verbal/tactile cues at the same time on abdomen ,she was able to develop better proprioception of when she was bearing down vs drawing in. She will continue to benefit from skilled PT intervention in order to decrease urinary incontinence, improve low back pain, correct abdominal pressure management, and begin/progress functional strengthening program.   OBJECTIVE IMPAIRMENTS: decreased activity tolerance, decreased coordination, decreased endurance, decreased mobility, decreased ROM, decreased strength, increased fascial restrictions, increased muscle spasms, impaired tone, postural dysfunction, and pain.   ACTIVITY LIMITATIONS: continence  PARTICIPATION LIMITATIONS: cleaning, laundry, interpersonal relationship, and community activity  PERSONAL FACTORS: 1 comorbidity: medical history  are also affecting patient's functional outcome.   REHAB POTENTIAL: Good  CLINICAL DECISION MAKING: Stable/uncomplicated  EVALUATION COMPLEXITY: Low   GOALS: Goals reviewed with patient? Yes  SHORT TERM GOALS: Target date: 06/10/2023 - updated 07/07/23   Pt will be independent with HEP.   Baseline: Goal status: MET 07/07/23  2.  Pt will be independent with the knack, urge suppression technique, and double voiding in order to improve bladder habits and decrease urinary incontinence.   Baseline:  Goal status: MET 07/07/23  3.  Pt will be able to correctly  perform diaphragmatic breathing and appropriate pressure management in order to prevent worsening vaginal wall laxity and improve pelvic floor A/ROM.   Baseline:  Goal status: MET 07/07/23  4.  Pt will report 25% improvement in urinary incontinence.  Baseline:  Goal status: MET 07/07/23  5.  Pt will report no pain or increased tone with palpation of Rt superficial/deep pelvic floor muscles.  Baseline:  Goal status: MET 07/07/23   LONG TERM GOALS: Target date: 10/28/23 - updated  07/07/23  Pt will be independent with advanced HEP.   Baseline:  Goal status: IN PROGRESS 07/07/23  2.  Pt will report 75% improvement in urinary incontinence.  Baseline:  Goal status:  IN PROGRESS 07/07/23  3.  Pt will report improvement in PFIQ-7 to less than 40 to demonstrate functional improvements.  Baseline:  Goal status:  IN PROGRESS 07/07/23  4.  Pt will increase pelvic floor muscle strength to at least 3/5 with appropriate relaxation.  Baseline:  Goal status:  IN PROGRESS 07/07/23  5.  Pt will be able to go 2-3 hours in between voids without urgency or incontinence in order to improve QOL and perform all functional activities with less difficulty.   Baseline:  Goal status:  IN PROGRESS 07/07/23   PLAN:  PT FREQUENCY: 1-2x/week  PT DURATION: 6 months  PLANNED INTERVENTIONS: 97110-Therapeutic exercises, 97530- Therapeutic activity, 97112- Neuromuscular re-education, 97535- Self Care, 91478- Manual therapy, Dry Needling, and Biofeedback  PLAN FOR NEXT SESSION: Pelvic floor muscle release to tolerance; progress down training; begin core strengthening.    Verlena Glenn, PT, DPT04/28/258:45 AM

## 2023-09-09 ENCOUNTER — Other Ambulatory Visit: Payer: Self-pay

## 2023-09-09 ENCOUNTER — Emergency Department (HOSPITAL_COMMUNITY)
Admission: EM | Admit: 2023-09-09 | Discharge: 2023-09-10 | Disposition: A | Discharge status: Home / Self Care | Attending: Emergency Medicine | Admitting: Emergency Medicine

## 2023-09-09 ENCOUNTER — Emergency Department (HOSPITAL_COMMUNITY)

## 2023-09-09 ENCOUNTER — Encounter (HOSPITAL_COMMUNITY): Payer: Self-pay

## 2023-09-09 DIAGNOSIS — M545 Low back pain, unspecified: Secondary | ICD-10-CM | POA: Diagnosis not present

## 2023-09-09 DIAGNOSIS — R519 Headache, unspecified: Secondary | ICD-10-CM | POA: Insufficient documentation

## 2023-09-09 LAB — BASIC METABOLIC PANEL WITH GFR
Anion gap: 13 (ref 5–15)
BUN: 11 mg/dL (ref 6–20)
CO2: 21 mmol/L — ABNORMAL LOW (ref 22–32)
Calcium: 9.4 mg/dL (ref 8.9–10.3)
Chloride: 106 mmol/L (ref 98–111)
Creatinine, Ser: 0.65 mg/dL (ref 0.44–1.00)
GFR, Estimated: >60 mL/min (ref >60)
Glucose, Bld: 97 mg/dL (ref 70–99)
Potassium: 3.7 mmol/L (ref 3.5–5.1)
Sodium: 140 mmol/L (ref 135–145)

## 2023-09-09 LAB — CBC WITH DIFFERENTIAL/PLATELET
Abs Immature Granulocytes: 0.01 10*3/uL (ref 0.00–0.07)
Basophils Absolute: 0 10*3/uL (ref 0.0–0.1)
Basophils Relative: 1 %
Eosinophils Absolute: 0.3 10*3/uL (ref 0.0–0.5)
Eosinophils Relative: 4 %
HCT: 41 % (ref 36.0–46.0)
Hemoglobin: 13.3 g/dL (ref 12.0–15.0)
Immature Granulocytes: 0 %
Lymphocytes Relative: 29 %
Lymphs Abs: 2.2 10*3/uL (ref 0.7–4.0)
MCH: 30.6 pg (ref 26.0–34.0)
MCHC: 32.4 g/dL (ref 30.0–36.0)
MCV: 94.5 fL (ref 80.0–100.0)
Monocytes Absolute: 0.5 10*3/uL (ref 0.1–1.0)
Monocytes Relative: 7 %
Neutro Abs: 4.5 10*3/uL (ref 1.7–7.7)
Neutrophils Relative %: 59 %
Platelets: 236 10*3/uL (ref 150–400)
RBC: 4.34 MIL/uL (ref 3.87–5.11)
RDW: 12 % (ref 11.5–15.5)
WBC: 7.5 10*3/uL (ref 4.0–10.5)
nRBC: 0 % (ref 0.0–0.2)

## 2023-09-09 LAB — HCG, SERUM, QUALITATIVE: Preg, Serum: NEGATIVE

## 2023-09-09 MED ORDER — IOHEXOL 350 MG/ML SOLN
75.0000 mL | Freq: Once | INTRAVENOUS | Status: AC | PRN
  Administered 2023-09-09: 75 mL via INTRAVENOUS

## 2023-09-09 MED ORDER — METOCLOPRAMIDE HCL 5 MG/ML IJ SOLN
10.0000 mg | Freq: Once | INTRAMUSCULAR | Status: AC
  Administered 2023-09-09: 10 mg via INTRAVENOUS
  Filled 2023-09-09: qty 2

## 2023-09-09 MED ORDER — LIDOCAINE 5 % EX PTCH
1.0000 | MEDICATED_PATCH | CUTANEOUS | Status: DC
  Administered 2023-09-09: 1 via TRANSDERMAL
  Filled 2023-09-09: qty 1

## 2023-09-09 MED ORDER — SODIUM CHLORIDE 0.9 % IV BOLUS
1000.0000 mL | Freq: Once | INTRAVENOUS | Status: AC
  Administered 2023-09-09: 1000 mL via INTRAVENOUS

## 2023-09-09 MED ORDER — HYDROCODONE-ACETAMINOPHEN 5-325 MG PO TABS
1.0000 | ORAL_TABLET | Freq: Once | ORAL | Status: AC
  Administered 2023-09-09: 1 via ORAL
  Filled 2023-09-09: qty 1

## 2023-09-09 MED ORDER — DEXAMETHASONE SODIUM PHOSPHATE 10 MG/ML IJ SOLN
10.0000 mg | Freq: Once | INTRAMUSCULAR | Status: AC
  Administered 2023-09-09: 10 mg via INTRAVENOUS
  Filled 2023-09-09: qty 1

## 2023-09-09 MED ORDER — ONDANSETRON 4 MG PO TBDP
4.0000 mg | ORAL_TABLET | Freq: Once | ORAL | Status: AC
  Administered 2023-09-09: 4 mg via ORAL
  Filled 2023-09-09: qty 1

## 2023-09-09 MED ORDER — DIPHENHYDRAMINE HCL 50 MG/ML IJ SOLN
12.5000 mg | Freq: Once | INTRAMUSCULAR | Status: AC
  Administered 2023-09-09: 12.5 mg via INTRAVENOUS
  Filled 2023-09-09: qty 1

## 2023-09-09 NOTE — ED Triage Notes (Signed)
 Pt to ED via [facility] from home. Pt started getting a headache yesterday. Pt has a history of migraines and usually takes meclizine  and ibuprofen . Pt states the medication is not helping with this headache. Pt states the pain is worse and medication is not helping. Pt states she is dizzy and has nausea with headache.   EMS VSS, cbg 101

## 2023-09-09 NOTE — Discharge Instructions (Addendum)
 You were administered a migraine cocktail in the emergency department with improvement in your headache.  Your symptoms of throbbing headache, nausea and vomiting, light sensitivity and sound sensitivity are most consistent with a migraine headache.  Given the sudden onset and maximal onset of your headache, further diagnostic testing with CT angiogram of the head and neck was performed which was overall reassuring.  You are offered lumbar puncture as this remains the gold standard diagnostic test to rule out subarachnoid hemorrhage and this was declined after weighing the risks and benefits of the test.  Please follow-up with your primary care provider, return for any severe worsening symptoms.

## 2023-09-09 NOTE — ED Notes (Signed)
 Patient transported to CT

## 2023-09-09 NOTE — ED Provider Triage Note (Signed)
 Emergency Medicine Provider Triage Evaluation Note  Carol Rangel , a 38 y.o. female  was evaluated in triage.  Pt complains of headache that started last night.  Initially improved with Tylenol  but rebounded.  Endorses associated emesis.  Has history of migraine headaches.  Review of Systems  Positive: As above Negative: As above  Physical Exam  BP 109/71 (BP Location: Left Arm)   Pulse 68   Temp 98.9 F (37.2 C) (Oral)   Resp 16   Ht 5' 5 (1.651 m)   Wt 68 kg   LMP 09/02/2023 (Exact Date)   SpO2 98%   BMI 24.96 kg/m  Gen:   Awake, no distress   Resp:  Normal effort  MSK:   Moves extremities without difficulty  Other:    Medical Decision Making  Medically screening exam initiated at 3:44 PM.  Appropriate orders placed.  Carol Rangel was informed that the remainder of the evaluation will be completed by another provider, this initial triage assessment does not replace that evaluation, and the importance of remaining in the ED until their evaluation is complete.     Hildegard Loge, PA-C 09/09/23 1544

## 2023-09-09 NOTE — ED Provider Notes (Addendum)
 [place] EMERGENCY DEPARTMENT AT [facility] Provider Note   CSN: [id number] Arrival date & time: 09/09/23  1456     Patient presents with: Headache   Carol Rangel is a 38 y.o. female.    Headache Associated symptoms: back pain      38 year old female with medical history significant for migraine headaches who presents to the emergency department with a headache.  The patient states that she had sudden onset headache that was difficult in characterization from her previous migraines.  The headache was located frontally and radiated to the vertex of her scalp with a throbbing characteristic, associated nausea, light sensitivity and sound sensitivity however the patient did state that the headache was sudden in onset and maximal in onset.  She denies any fevers.  She did have an episode of emesis.  She additionally complains of lower back pain which has been present for the last 3 days.  She denies any trauma.  She just finished her menstrual cycle.  She does not think she could be pregnant and denies any vaginal bleeding or vaginal discharge.  She does endorse mild dysuria and has bilateral flank pain however her back pain is also worse with twisting and movement.  She denies any recent falls or injury.  She denies saddle anesthesia.  She denies any lower extremity weakness or numbness.  She denies any difficulty swallowing or speaking, no facial droop or numbness and no other extremity numbness or weakness.  No radiation of pain down her legs.  Prior to Admission medications   Medication Sig Start Date End Date Taking? Authorizing Provider  atorvastatin  (LIPITOR) 40 MG tablet Take 1 tablet (40 mg total) by mouth daily. 04/17/23 07/16/23  Oley Bascom RAMAN, NP  buPROPion  (WELLBUTRIN  XL) 150 MG 24 hr tablet Take 1 tablet (150 mg total) by mouth daily for 14 days. 02/18/23 04/16/23  Nicholaus Almarie HERO, MD  buPROPion  (WELLBUTRIN  XL) 300 MG 24 hr tablet Take 1 tablet (300 mg total) by mouth  daily. 02/18/23   Nicholaus Almarie HERO, MD  meclizine  (ANTIVERT ) 12.5 MG tablet Take 1 tablet (12.5 mg total) by mouth 3 (three) times daily as needed. 04/16/23   Oley Bascom RAMAN, NP  norethindrone  (MICRONOR ) 0.35 MG tablet Take 1 tablet (0.35 mg total) by mouth daily. Take for 21 days, then stop for 7 days to have a period. Repeat monthly 02/18/23   Nicholaus Almarie HERO, MD  omega-3 acid ethyl esters (LOVAZA ) 1 g capsule Take 2 capsules (2 g total) by mouth daily. Patient not taking: Reported on 04/16/2023 10/14/22 01/12/23  Oley Bascom RAMAN, NP    Allergies: Patient has no known allergies.    Review of Systems  Musculoskeletal:  Positive for back pain.  Neurological:  Positive for headaches.  All other systems reviewed and are negative.   Updated Vital Signs BP (!) 92/52 (BP Location: Right Arm)   Pulse (!) 56   Temp 98.6 F (37 C) (Oral)   Resp 15   Ht 5' 5 (1.651 m)   Wt 68 kg   LMP 09/02/2023 (Exact Date)   SpO2 100%   BMI 24.96 kg/m   Physical Exam Vitals and nursing note reviewed.  Constitutional:      General: She is not in acute distress.    Appearance: She is well-developed.  HENT:     Head: Normocephalic and atraumatic.   Eyes:     Conjunctiva/sclera: Conjunctivae normal.   Neck:     Comments: No meningismus Cardiovascular:  Rate and Rhythm: Normal rate and regular rhythm.  Pulmonary:     Effort: Pulmonary effort is normal. No respiratory distress.     Breath sounds: Normal breath sounds.  Abdominal:     Palpations: Abdomen is soft.     Tenderness: There is no abdominal tenderness.   Musculoskeletal:        General: No swelling.     Cervical back: Neck supple.     Comments: No midline tenderness to palpation of the cervical, thoracic or lumbar spine.   Skin:    General: Skin is warm and dry.     Capillary Refill: Capillary refill takes less than 2 seconds.   Neurological:     Mental Status: She is alert.     GCS: GCS eye subscore is 4. GCS verbal  subscore is 5. GCS motor subscore is 6.     Comments: MENTAL STATUS EXAM:    Orientation: Alert and oriented to person, place and time.  Memory: Cooperative, follows commands well.  Language: Speech is clear and language is normal.   CRANIAL NERVES:    CN 2 (Optic): Visual fields intact to confrontation.  CN 3,4,6 (EOM): Pupils equal and reactive to light. Full extraocular eye movement without nystagmus.  CN 5 (Trigeminal): Facial sensation is normal, no weakness of masticatory muscles.  CN 7 (Facial): No facial weakness or asymmetry.  CN 8 (Auditory): Auditory acuity grossly normal.  CN 9,10 (Glossophar): The uvula is midline, the palate elevates symmetrically.  CN 11 (spinal access): Normal sternocleidomastoid and trapezius strength.  CN 12 (Hypoglossal): The tongue is midline. No atrophy or fasciculations.SABRA   MOTOR:  Muscle Strength: 5/5RUE, 5/5LUE, 5/5RLE, 5/5LLE.   COORDINATION:   Intact finger-to-nose, no tremor.   SENSATION:   Intact to light touch all four extremities.  GAIT: Gait not assessed   Psychiatric:        Mood and Affect: Mood normal.     (all labs ordered are listed, but only abnormal results are displayed) Labs Reviewed  BASIC METABOLIC PANEL WITH GFR - Abnormal; Notable for the following components:      Result Value   CO2 21 (*)    All other components within normal limits  CBC WITH DIFFERENTIAL/PLATELET  HCG, SERUM, QUALITATIVE  URINALYSIS, W/ REFLEX TO CULTURE (INFECTION SUSPECTED)    EKG: None  Radiology: CT ANGIO HEAD NECK W WO CM Result Date: 09/09/2023 CLINICAL DATA:  Initial evaluation for acute headache. EXAM: CT ANGIOGRAPHY HEAD AND NECK WITH AND WITHOUT CONTRAST TECHNIQUE: Multidetector CT imaging of the head and neck was performed using the standard protocol during bolus administration of intravenous contrast. Multiplanar CT image reconstructions and MIPs were obtained to evaluate the vascular anatomy. Carotid stenosis measurements (when  applicable) are obtained utilizing NASCET criteria, using the distal internal carotid diameter as the denominator. RADIATION DOSE REDUCTION: This exam was performed according to the departmental dose-optimization program which includes automated exposure control, adjustment of the mA and/or kV according to patient size and/or use of iterative reconstruction technique. CONTRAST:  75mL OMNIPAQUE IOHEXOL 350 MG/ML SOLN COMPARISON:  CT from earlier the same day. FINDINGS: CT HEAD FINDINGS Brain: Cerebral volume within normal limits for patient age. No acute intracranial hemorrhage. No acute large vessel territory infarct. No mass lesion, midline shift, or mass effect. Ventricles are normal in size without hydrocephalus. No extra-axial fluid collection. Vascular: No abnormal hyperdense vessel. Skull: Scalp soft tissues demonstrate no acute abnormality. Calvarium intact. Sinuses/Orbits: Globes and orbital soft tissues within normal limits.  Visualized paranasal sinuses are largely clear. No significant mastoid effusion. CTA NECK FINDINGS Aortic arch: Standard branching. Imaged portion shows no evidence of aneurysm or dissection. No significant stenosis of the major arch vessel origins. Right carotid system: No evidence of dissection, stenosis (50% or greater), or occlusion. Left carotid system: No evidence of dissection, stenosis (50% or greater), or occlusion. Vertebral arteries: No evidence of dissection, stenosis (50% or greater), or occlusion. Skeleton: Negative. Other neck: Negative. Upper chest: Negative. Review of the MIP images confirms the above findings CTA HEAD FINDINGS Anterior circulation: Both internal carotid arteries widely patent to the termini without stenosis. A1 segments widely patent. Normal anterior communicating artery complex. Both anterior cerebral arteries widely patent to their distal aspects without stenosis. No M1 stenosis or occlusion. Normal MCA bifurcations. Distal MCA branches well perfused  and symmetric. Posterior circulation: Both V4 segments patent without stenosis. Left vertebral artery slightly dominant. Left PICA patent. Right PICA origin not seen. Basilar patent without stenosis. Superior cerebellar and posterior cerebral arteries noted bilaterally. Venous sinuses: Patent allowing for timing the contrast bolus. Few scattered foci of gas lucency about the cavernous sinus, likely related IV access. Anatomic variants: None significant.  No aneurysm. Review of the MIP images confirms the above findings IMPRESSION: 1. Normal CTA of the head and neck. No large vessel occlusion, hemodynamically significant stenosis, or other acute vascular abnormality. No aneurysm. 2. No other acute intracranial abnormality. Electronically Signed   By: Morene Hoard M.D.   On: 09/09/2023 23:23   CT Head Wo Contrast Result Date: 09/09/2023 CLINICAL DATA:  New onset headache beginning yesterday. EXAM: CT HEAD WITHOUT CONTRAST TECHNIQUE: Contiguous axial images were obtained from the base of the skull through the vertex without intravenous contrast. RADIATION DOSE REDUCTION: This exam was performed according to the departmental dose-optimization program which includes automated exposure control, adjustment of the mA and/or kV according to patient size and/or use of iterative reconstruction technique. COMPARISON:  None Available. FINDINGS: Brain: The brain shows a normal appearance without evidence of malformation, atrophy, old or acute small or large vessel infarction, mass lesion, hemorrhage, hydrocephalus or extra-axial collection. Vascular: No hyperdense vessel. No evidence of atherosclerotic calcification. Skull: Normal.  No traumatic finding.  No focal bone lesion. Sinuses/Orbits: Sinuses are clear. Orbits appear normal. Mastoids are clear. Other: None significant IMPRESSION: Normal head CT. Electronically Signed   By: Oneil Officer M.D.   On: 09/09/2023 16:12     Procedures   Medications Ordered in  the ED  lidocaine  (LIDODERM ) 5 % 1 patch (1 patch Transdermal Patch Applied 09/09/23 2237)  ondansetron  (ZOFRAN -ODT) disintegrating tablet 4 mg (4 mg Oral Given 09/09/23 1555)  HYDROcodone-acetaminophen  (NORCO/VICODIN) 5-325 MG per tablet 1 tablet (1 tablet Oral Given 09/09/23 1554)  metoCLOPramide  (REGLAN ) injection 10 mg (10 mg Intravenous Given 09/09/23 2238)  diphenhydrAMINE  (BENADRYL ) injection 12.5 mg (12.5 mg Intravenous Given 09/09/23 2237)  sodium chloride  0.9 % bolus 1,000 mL (0 mLs Intravenous Stopped 09/09/23 2233)  iohexol (OMNIPAQUE) 350 MG/ML injection 75 mL (75 mLs Intravenous Contrast Given 09/09/23 2256)  dexamethasone (DECADRON) injection 10 mg (10 mg Intravenous Given 09/09/23 2324)                                    Medical Decision Making Amount and/or Complexity of Data Reviewed Labs: ordered. Radiology: ordered.  Risk Prescription drug management.    38 year old female with medical history significant for migraine headaches  who presents to the emergency department with a headache.  The patient states that she had sudden onset headache that was difficult in characterization from her previous migraines.  The headache was located frontally and radiated to the vertex of her scalp with a throbbing characteristic, associated nausea, light sensitivity and sound sensitivity however the patient did state that the headache was sudden in onset and maximal in onset.  She denies any fevers.  She did have an episode of emesis.  She additionally complains of lower back pain which has been present for the last 3 days.  She denies any trauma.  She just finished her menstrual cycle.  She does not think she could be pregnant and denies any vaginal bleeding or vaginal discharge.  She does endorse mild dysuria and has bilateral flank pain however her back pain is also worse with twisting and movement.  She denies any recent falls or injury.  She denies saddle anesthesia.  She denies any lower  extremity weakness or numbness.  She denies any difficulty swallowing or speaking, no facial droop or numbness and no other extremity numbness or weakness.  No radiation of pain down her legs.  Carol Rangel is a 38 y.o. female who presents with headache and low back pain as per above. I have reviewed the nursing documentation for past medical history, family history, and social history. I have reviewed the EMR and have learned that she has a hx of migraine headaches.  Currently she is awake, alert, GCS 15, HDS, and afebrile. Her exam is most notable for fully intact extraocular motions with bilaterally reactive pupils, no focal neurologic deficits, no meningismus, and no temporal tenderness. There is no rash. The headache unfortunately was reported as sudden in onset and maximal in onset and feels different from prior migraine headaches. She is neuro intact. Had endorsed some dizziness but has no nystamus or dysmetria on exam. Presented outside 6 hour window, CT Head obtain in triage was normal. Additionally, patient endorsing was sounds like MSK low back pain, however also has been endorsing dysuria, considered UTI/pyelonephritis. UA ordered.   In the setting of thunderclap headache, I considered subarachnoid hemorrhage versus migraine headache.  I discussed bedside with the patient risks and benefits of lumbar puncture versus CTA imaging to further evaluate.  After discussion the, the patient would prefer to proceed with initial testing of CTA imaging.  Her initial CT head was unremarkable.  Screening laboratory evaluation was also unremarkable.  Urinalysis was pending.  The patient was administered a migraine cocktail.  Regarding the patient's low back pain, she was administered initially Norco from triage and was given a lidocaine  patch and Flexeril  on my evaluation.  She is neurologically intact, low concern for cauda equina syndrome, suspect likely musculoskeletal low back pain however urinalysis is  pending.    CTA Head and Neck: IMPRESSION:  1. Normal CTA of the head and neck. No large vessel occlusion,  hemodynamically significant stenosis, or other acute vascular  abnormality. No aneurysm.  2. No other acute intracranial abnormality.   Risks and benefits of further diagnostic testing with lumbar puncture was discussed with the patient and family bedside.  After weighing the risks and benefits, the patient elected to defer lumbar puncture testing at this time.  From a headache standpoint, the patient's symptoms have resolved following a migraine cocktail.  Urinalysis pending and patient willing to stay to give urine sample.  Signout given at 2330 to follow-up results of diagnostic testing, reassess the patient, ultimate  disposition pending results of diagnostic testing and reassessment. Singout given to Dr. Griselda at 2330.  ED Medication Summary: Medications  lidocaine  (LIDODERM ) 5 % 1 patch (1 patch Transdermal Patch Applied 09/09/23 2237)  ondansetron  (ZOFRAN -ODT) disintegrating tablet 4 mg (4 mg Oral Given 09/09/23 1555)  HYDROcodone-acetaminophen  (NORCO/VICODIN) 5-325 MG per tablet 1 tablet (1 tablet Oral Given 09/09/23 1554)  metoCLOPramide  (REGLAN ) injection 10 mg (10 mg Intravenous Given 09/09/23 2238)  diphenhydrAMINE  (BENADRYL ) injection 12.5 mg (12.5 mg Intravenous Given 09/09/23 2237)  sodium chloride  0.9 % bolus 1,000 mL (0 mLs Intravenous Stopped 09/09/23 2233)  iohexol (OMNIPAQUE) 350 MG/ML injection 75 mL (75 mLs Intravenous Contrast Given 09/09/23 2256)  dexamethasone (DECADRON) injection 10 mg (10 mg Intravenous Given 09/09/23 2324)        Final diagnoses:  Acute nonintractable headache, unspecified headache type  Acute bilateral low back pain without sciatica    ED Discharge Orders     None          Jerrol Agent, MD 09/09/23 2316    Jerrol Agent, MD 09/09/23 JERRLYN    Jerrol Agent, MD 09/09/23 2359

## 2023-09-10 LAB — URINALYSIS, W/ REFLEX TO CULTURE (INFECTION SUSPECTED)
Bilirubin Urine: NEGATIVE
Glucose, UA: NEGATIVE mg/dL
Hgb urine dipstick: NEGATIVE
Ketones, ur: NEGATIVE mg/dL
Leukocytes,Ua: NEGATIVE
Nitrite: NEGATIVE
Protein, ur: NEGATIVE mg/dL
Specific Gravity, Urine: >1.046 — ABNORMAL HIGH (ref 1.005–1.030)
pH: 5 (ref 5.0–8.0)

## 2023-09-10 NOTE — ED Provider Notes (Signed)
 Care assumed at 2330.  Patient here for evaluation of bilateral flank pain, dysuria and headache.  Headache is resolved.  Care assumed pending urinalysis regarding her dysuria and flank pain.  US  not c/w UTI.  Plan to d/c home with outpatient follow up and return precautions.     Griselda Norris, MD 09/10/23 [phone]

## 2023-09-11 ENCOUNTER — Telehealth: Payer: Self-pay

## 2023-09-11 NOTE — Transitions of Care (Post Inpatient/ED Visit) (Signed)
   09/11/2023  Name: Carol Rangel MRN: [id number] DOB: 11-13-1985  Today's TOC FU Call Status:   Patient's Name and Date of Birth confirmed.  Transition Care Management Follow-up Telephone Call Date of Discharge: 09/10/23 Discharge Facility: [facility]) Type of Discharge: Emergency Department How have you been since you were released from the hospital?: Better Any questions or concerns?: No  Items Reviewed: Did you receive and understand the discharge instructions provided?: Yes Medications obtained,verified, and reconciled?: No Any new allergies since your discharge?: No Dietary orders reviewed?: NA Do you have support at home?: Yes People in Home [RPT]: other relative(s)  Medications Reviewed Today: Medications Reviewed Today     Reviewed by Starlene Charlynn BIRCH, CMA (Certified Medical Assistant) on 09/11/23 at 1124  Med List Status: <None>   Medication Order Taking? Sig Documenting Provider Last Dose Status Informant  atorvastatin  (LIPITOR) 40 MG tablet [id number]  Take 1 tablet (40 mg total) by mouth daily. Oley Bascom RAMAN, NP  Expired 07/16/23 2359   buPROPion  (WELLBUTRIN  XL) 150 MG 24 hr tablet [id number]  Take 1 tablet (150 mg total) by mouth daily for 14 days. Nicholaus Almarie HERO, MD  Expired 04/16/23 2359   buPROPion  (WELLBUTRIN  XL) 300 MG 24 hr tablet [id number] Yes Take 1 tablet (300 mg total) by mouth daily. Nicholaus Almarie HERO, MD  Active   meclizine  (ANTIVERT ) 12.5 MG tablet [id number] Yes Take 1 tablet (12.5 mg total) by mouth 3 (three) times daily as needed. Oley Bascom RAMAN, NP  Active   norethindrone  (MICRONOR ) 0.35 MG tablet [id number] Yes Take 1 tablet (0.35 mg total) by mouth daily. Take for 21 days, then stop for 7 days to have a period. Repeat monthly Nicholaus Almarie HERO, MD  Active   omega-3 acid ethyl esters (LOVAZA ) 1 g capsule [id number]  Take 2 capsules (2 g total) by mouth daily.  Patient not taking: Reported on 04/16/2023   Oley Bascom RAMAN, NP  Expired 01/12/23  2359             Home Care and Equipment/Supplies: Were Home Health Services Ordered?: NA Any new equipment or medical supplies ordered?: NA  Functional Questionnaire: Do you need assistance with bathing/showering or dressing?: No Do you need assistance with meal preparation?: No Do you need assistance with eating?: No Do you have difficulty maintaining continence: No Do you need assistance with getting out of bed/getting out of a chair/moving?: No Do you have difficulty managing or taking your medications?: No  Follow up appointments reviewed: PCP Follow-up appointment confirmed?: Yes Date of PCP follow-up appointment?: 09/26/23 [facility]ist Hospital Follow-up appointment confirmed?: NA Do you need transportation to your follow-up appointment?: No Do you understand care options if your condition(s) worsen?: Yes-patient verbalized understanding    SIGNATURE Bryar Rennie, RMA

## 2023-09-26 ENCOUNTER — Ambulatory Visit: Payer: Self-pay | Admitting: Nurse Practitioner

## 2023-09-26 ENCOUNTER — Encounter: Payer: Self-pay | Admitting: Nurse Practitioner

## 2023-09-26 VITALS — BP 100/68 | HR 86 | Wt 148.0 lb

## 2023-09-26 DIAGNOSIS — N390 Urinary tract infection, site not specified: Secondary | ICD-10-CM | POA: Diagnosis not present

## 2023-09-26 DIAGNOSIS — E782 Mixed hyperlipidemia: Secondary | ICD-10-CM

## 2023-09-26 LAB — POCT URINE DIPSTICK
Blood, UA: NEGATIVE
Glucose, UA: NEGATIVE mg/dL
Ketones, POC UA: NEGATIVE mg/dL
Nitrite, UA: NEGATIVE
POC PROTEIN,UA: 100 — AB
Spec Grav, UA: >=1.03 — AB (ref 1.010–1.025)
Urobilinogen, UA: 0.2 U/dL
pH, UA: 5.5 (ref 5.0–8.0)

## 2023-09-26 MED ORDER — OMEGA-3-ACID ETHYL ESTERS 1 G PO CAPS: 2.0000 g | ORAL_CAPSULE | Freq: Every day | ORAL | 2 refills | Status: DC

## 2023-09-26 MED ORDER — SULFAMETHOXAZOLE-TRIMETHOPRIM 800-160 MG PO TABS: 1.0000 | ORAL_TABLET | Freq: Two times a day (BID) | ORAL | 0 refills | Status: AC

## 2023-09-26 MED ORDER — IBUPROFEN 600 MG PO TABS: 600.0000 mg | ORAL_TABLET | Freq: Three times a day (TID) | ORAL | 0 refills | Status: DC | PRN

## 2023-09-26 MED ORDER — ATORVASTATIN CALCIUM 40 MG PO TABS
40.0000 mg | ORAL_TABLET | Freq: Every day | ORAL | 2 refills | Status: AC
Start: 2023-09-26 — End: 2023-12-25

## 2023-09-26 MED ORDER — OMEGA-3-ACID ETHYL ESTERS 1 G PO CAPS: 2.0000 g | ORAL_CAPSULE | Freq: Every day | ORAL | 2 refills | Status: AC

## 2023-09-26 NOTE — Progress Notes (Signed)
 Subjective   Patient ID: Carol Rangel, female    DOB: 20-May-1985, 38 y.o.   MRN: [id number]  Chief Complaint  Patient presents with   Hospitalization Follow-up    Referring provider: Oley Bascom RAMAN, NP  [name] is a 38 y.o. female with Past Medical History: No date: Gestational diabetes No date: Medical history non-contributory 12/20/2013: Nausea with vomiting 09/24/2019: Sciatica of right side   HPI  Patient presents today for an ED follow-up.  She states that she is continue to have low back pain.  UA in office did show trace leukocytes.  Urine will be sent for culture.  We will order Bactrim .  Patient does need refills on medications today.  Denies f/c/s, n/v/d, hemoptysis, PND, leg swelling Denies chest pain or edema   No Known Allergies  Immunization History  Administered Date(s) Administered   Tdap 10/28/2019, 01/31/2021    Tobacco History: Social History   Tobacco Use  Smoking Status Never  Smokeless Tobacco Never   Counseling given: Not Answered   Outpatient Encounter Medications as of 09/26/2023  Medication Sig   buPROPion  (WELLBUTRIN  XL) 300 MG 24 hr tablet Take 1 tablet (300 mg total) by mouth daily.   ibuprofen  (ADVIL ) 600 MG tablet Take 1 tablet (600 mg total) by mouth every 8 (eight) hours as needed.   meclizine  (ANTIVERT ) 12.5 MG tablet Take 1 tablet (12.5 mg total) by mouth 3 (three) times daily as needed.   norethindrone  (MICRONOR ) 0.35 MG tablet Take 1 tablet (0.35 mg total) by mouth daily. Take for 21 days, then stop for 7 days to have a period. Repeat monthly   [EXPIRED] sulfamethoxazole -trimethoprim  (BACTRIM  DS) 800-160 MG tablet Take 1 tablet by mouth 2 (two) times daily for 3 days.   [DISCONTINUED] atorvastatin  (LIPITOR) 40 MG tablet Take 1 tablet (40 mg total) by mouth daily.   [DISCONTINUED] omega-3 acid ethyl esters (LOVAZA ) 1 g capsule Take 2 capsules (2 g total) by mouth daily.   atorvastatin  (LIPITOR) 40 MG tablet Take 1 tablet (40  mg total) by mouth daily.   buPROPion  (WELLBUTRIN  XL) 150 MG 24 hr tablet Take 1 tablet (150 mg total) by mouth daily for 14 days. (Patient not taking: Reported on 09/26/2023)   omega-3 acid ethyl esters (LOVAZA ) 1 g capsule Take 2 capsules (2 g total) by mouth daily.   [DISCONTINUED] omega-3 acid ethyl esters (LOVAZA ) 1 g capsule Take 2 capsules (2 g total) by mouth daily.   No facility-administered encounter medications on file as of 09/26/2023.    Review of Systems  Review of Systems  Constitutional: Negative.   HENT: Negative.    Cardiovascular: Negative.   Gastrointestinal: Negative.   Allergic/Immunologic: Negative.   Neurological: Negative.   Psychiatric/Behavioral: Negative.       Objective:   BP 100/68   Pulse 86   Wt 148 lb (67.1 kg)   LMP 09/02/2023 (Exact Date)   SpO2 99%   BMI 24.63 kg/m   Wt Readings from Last 5 Encounters:  09/26/23 148 lb (67.1 kg)  09/09/23 150 lb (68 kg)  04/16/23 154 lb 9.6 oz (70.1 kg)  02/18/23 151 lb 9.6 oz (68.8 kg)  10/14/22 149 lb 12.8 oz (67.9 kg)     Physical Exam Vitals and nursing note reviewed.  Constitutional:      General: She is not in acute distress.    Appearance: She is well-developed.  Cardiovascular:     Rate and Rhythm: Normal rate and regular rhythm.  Pulmonary:  Effort: Pulmonary effort is normal.     Breath sounds: Normal breath sounds.  Neurological:     Mental Status: She is alert and oriented to person, place, and time.       Assessment & Plan:   Urinary tract infection without hematuria, site unspecified -     Urine Culture -     POCT URINE DIPSTICK  Mixed hyperlipidemia -     Omega-3-acid  Ethyl Esters; Take 2 capsules (2 g total) by mouth daily.  Dispense: 60 capsule; Refill: 2 -     Atorvastatin  Calcium ; Take 1 tablet (40 mg total) by mouth daily.  Dispense: 30 tablet; Refill: 2 -     Lipid panel  Other orders -     Ibuprofen ; Take 1 tablet (600 mg total) by mouth every 8 (eight)  hours as needed.  Dispense: 30 tablet; Refill: 0 -     Sulfamethoxazole -Trimethoprim ; Take 1 tablet by mouth 2 (two) times daily for 3 days.  Dispense: 6 tablet; Refill: 0     Return in about 6 months (around 03/28/2024).   Bascom GORMAN Borer, NP 10/09/2023

## 2023-09-27 LAB — LIPID PANEL
Chol/HDL Ratio: 4.2 ratio (ref 0.0–4.4)
Cholesterol, Total: 156 mg/dL (ref 100–199)
HDL: 37 mg/dL — ABNORMAL LOW (ref >39)
LDL Chol Calc (NIH): 96 mg/dL (ref 0–99)
Triglycerides: 130 mg/dL (ref 0–149)
VLDL Cholesterol Cal: 23 mg/dL (ref 5–40)

## 2023-09-28 ENCOUNTER — Ambulatory Visit: Payer: Self-pay | Admitting: Nurse Practitioner

## 2023-09-28 LAB — URINE CULTURE

## 2023-10-20 ENCOUNTER — Ambulatory Visit (HOSPITAL_COMMUNITY)
Admission: EM | Admit: 2023-10-20 | Discharge: 2023-10-20 | Disposition: A | Discharge status: Home / Self Care | Attending: Family Medicine | Admitting: Family Medicine

## 2023-10-20 ENCOUNTER — Ambulatory Visit (INDEPENDENT_AMBULATORY_CARE_PROVIDER_SITE_OTHER)

## 2023-10-20 ENCOUNTER — Encounter (HOSPITAL_COMMUNITY): Payer: Self-pay | Admitting: *Deleted

## 2023-10-20 ENCOUNTER — Other Ambulatory Visit: Payer: Self-pay

## 2023-10-20 ENCOUNTER — Ambulatory Visit: Payer: Self-pay | Admitting: Nurse Practitioner

## 2023-10-20 DIAGNOSIS — M25532 Pain in left wrist: Secondary | ICD-10-CM | POA: Diagnosis not present

## 2023-10-20 MED ORDER — IBUPROFEN 600 MG PO TABS: 600.0000 mg | ORAL_TABLET | Freq: Three times a day (TID) | ORAL | 0 refills | Status: AC | PRN

## 2023-10-20 NOTE — ED Triage Notes (Signed)
 Pt reports she was moving one of her plant pots. Pt reports she felt a pop when moving the pot. Pt has had pain in Lt wrist for one month. PT took tylenol  with out relief.

## 2023-10-20 NOTE — Discharge Instructions (Addendum)
 By my review there are no fractures on your x-rays. The radiologist will also read your x-ray, and if their interpretation differs significantly from mine, and the management of your condition would change, we will call you.  Take ibuprofen  800 mg--1 tab every 8 hours as needed for pain.  You can use the QR code/website at the back of the summary paperwork to schedule yourself a new patient appointment with primary care 16 months are probably but are not positive

## 2023-10-20 NOTE — ED Provider Notes (Signed)
 [facility]    CSN: [id number] Arrival date & time: 10/20/23  1906      History   Chief Complaint Chief Complaint  Patient presents with   Wrist Pain    HPI Carol Rangel is a 38 y.o. female.    Wrist Pain  Here for left wrist pain that has been bothering her for about a month.  When it started bothering her she was moving a large potted plant and she felt a pop.  It has been hurting for 1 month and the ulnar side of her wrist.  Maybe it is a little swollen.  The pain starts there and can radiate proximally and distally.  She finds it difficult to flex and extend her wrist.  NKDA  Last menstrual cycle was July 22  She has been taking Tylenol  with just slight relief.  Past Medical History:  Diagnosis Date   Gestational diabetes    Medical history non-contributory    Nausea with vomiting 12/20/2013   Sciatica of right side 09/24/2019    Patient Active Problem List   Diagnosis Date Noted   Dysmenorrhea 02/18/2023   Thyroid  disorder screen 10/14/2022   Nexplanon  insertion 05/17/2021   Anemia 11/09/2020   Supervision of other high risk pregnancy, antepartum 10/11/2020   Gestational diabetes 10/11/2020   Short interval between pregnancies affecting pregnancy, antepartum 10/11/2020   AMA (advanced maternal age) multigravida 35+ 10/11/2020   Language barrier affecting health care 10/28/2019   Sciatica of right side 09/24/2019    Past Surgical History:  Procedure Laterality Date   BREAST SURGERY Right    ?abscess surgery x2    OB History     Gravida  2   Para  2   Term  2   Preterm      AB      Living  2      SAB      IAB      Ectopic      Multiple  0   Live Births  2            Home Medications    Prior to Admission medications   Medication Sig Start Date End Date Taking? Authorizing Provider  atorvastatin  (LIPITOR) 40 MG tablet Take 1 tablet (40 mg total) by mouth daily. 09/26/23 12/25/23 Yes Oley Bascom RAMAN, NP  buPROPion   (WELLBUTRIN  XL) 300 MG 24 hr tablet Take 1 tablet (300 mg total) by mouth daily. 02/18/23  Yes Nicholaus Almarie HERO, MD  ibuprofen  (ADVIL ) 600 MG tablet Take 1 tablet (600 mg total) by mouth every 8 (eight) hours as needed (pain). 10/20/23  Yes Roby Donaway K, MD  omega-3 acid ethyl esters (LOVAZA ) 1 g capsule Take 2 capsules (2 g total) by mouth daily. 09/26/23 12/25/23 Yes Oley Bascom RAMAN, NP  buPROPion  (WELLBUTRIN  XL) 150 MG 24 hr tablet Take 1 tablet (150 mg total) by mouth daily for 14 days. Patient not taking: Reported on 09/26/2023 02/18/23 04/16/23  Nicholaus Almarie HERO, MD  meclizine  (ANTIVERT ) 12.5 MG tablet Take 1 tablet (12.5 mg total) by mouth 3 (three) times daily as needed. 04/16/23   Oley Bascom RAMAN, NP  norethindrone  (MICRONOR ) 0.35 MG tablet Take 1 tablet (0.35 mg total) by mouth daily. Take for 21 days, then stop for 7 days to have a period. Repeat monthly 02/18/23   Nicholaus Almarie HERO, MD    Family History Family History  Problem Relation Age of Onset   Hypertension Mother  Stroke Father    Hypertension Father    Cancer Maternal Grandmother     Social History Social History   Tobacco Use   Smoking status: Never   Smokeless tobacco: Never  Vaping Use   Vaping status: Never Used  Substance Use Topics   Alcohol use: No   Drug use: No     Allergies   Patient has no known allergies.   Review of Systems Review of Systems   Physical Exam Triage Vital Signs ED Triage Vitals  Encounter Vitals Group     BP 10/20/23 1958 116/77     Girls Systolic BP Percentile --      Girls Diastolic BP Percentile --      Boys Systolic BP Percentile --      Boys Diastolic BP Percentile --      Pulse Rate 10/20/23 1958 67     Resp 10/20/23 1958 18     Temp 10/20/23 1958 98.1 F (36.7 C)     Temp src --      SpO2 10/20/23 1958 98 %     Weight --      Height --      Head Circumference --      Peak Flow --      Pain Score 10/20/23 1955 10     Pain Loc --      Pain Education  --      Exclude from Growth Chart --    No data found.  Updated Vital Signs BP 116/77   Pulse 67   Temp 98.1 F (36.7 C)   Resp 18   LMP 10/07/2023   SpO2 98%   Visual Acuity Right Eye Distance:   Left Eye Distance:   Bilateral Distance:    Right Eye Near:   Left Eye Near:    Bilateral Near:     Physical Exam Vitals reviewed.  Constitutional:      General: She is not in acute distress.    Appearance: She is not ill-appearing, toxic-appearing or diaphoretic.  Musculoskeletal:     Comments: The distal ulna is prominent at the left wrist, though without any erythema or soft tissue swelling.  There is also no ecchymosis.  It is mildly tender there.  Pulses are normal in the wrist.  Capillary refill and neurologic exam is normal distally.  She does have difficulty flexing and extending at the left wrist.  Skin:    Coloration: Skin is not pale.  Neurological:     General: No focal deficit present.     Mental Status: She is alert and oriented to person, place, and time.  Psychiatric:        Behavior: Behavior normal.      UC Treatments / Results  Labs (all labs ordered are listed, but only abnormal results are displayed) Labs Reviewed - No data to display  EKG   Radiology No results found.  Procedures Procedures (including critical care time)  Medications Ordered in UC Medications - No data to display  Initial Impression / Assessment and Plan / UC Course  I have reviewed the triage vital signs and the nursing notes.  Pertinent labs & imaging results that were available during my care of the patient were reviewed by me and considered in my medical decision making (see chart for details).     By my review x-rays are negative.  She is provided a wrist brace and I have sent ibuprofen  to the pharmacy.  She is given contact  information for orthopedics.  She is given instructions on how to set up primary care on the our website. Final Clinical Impressions(s) / UC  Diagnoses   Final diagnoses:  Left wrist pain     Discharge Instructions      By my review there are no fractures on your x-rays. The radiologist will also read your x-ray, and if their interpretation differs significantly from mine, and the management of your condition would change, we will call you.  Take ibuprofen  800 mg--1 tab every 8 hours as needed for pain.  You can use the QR code/website at the back of the summary paperwork to schedule yourself a new patient appointment with primary care 16 months are probably but are not positive     ED Prescriptions     Medication Sig Dispense Auth. Provider   ibuprofen  (ADVIL ) 600 MG tablet Take 1 tablet (600 mg total) by mouth every 8 (eight) hours as needed (pain). 15 tablet Mirah Nevins K, MD      PDMP not reviewed this encounter.   Vonna Sharlet POUR, MD 10/20/23 [phone]

## 2023-12-31 ENCOUNTER — Emergency Department (HOSPITAL_BASED_OUTPATIENT_CLINIC_OR_DEPARTMENT_OTHER)
Admission: EM | Admit: 2023-12-31 | Discharge: 2023-12-31 | Disposition: A | Discharge status: Home / Self Care | Attending: Emergency Medicine | Admitting: Emergency Medicine

## 2023-12-31 ENCOUNTER — Encounter (HOSPITAL_BASED_OUTPATIENT_CLINIC_OR_DEPARTMENT_OTHER): Payer: Self-pay | Admitting: Emergency Medicine

## 2023-12-31 ENCOUNTER — Emergency Department (HOSPITAL_BASED_OUTPATIENT_CLINIC_OR_DEPARTMENT_OTHER)

## 2023-12-31 ENCOUNTER — Other Ambulatory Visit: Payer: Self-pay

## 2023-12-31 DIAGNOSIS — M549 Dorsalgia, unspecified: Secondary | ICD-10-CM | POA: Insufficient documentation

## 2023-12-31 DIAGNOSIS — M545 Low back pain, unspecified: Secondary | ICD-10-CM

## 2023-12-31 DIAGNOSIS — R1011 Right upper quadrant pain: Secondary | ICD-10-CM | POA: Diagnosis present

## 2023-12-31 DIAGNOSIS — R109 Unspecified abdominal pain: Secondary | ICD-10-CM

## 2023-12-31 LAB — URINALYSIS, ROUTINE W REFLEX MICROSCOPIC
Bilirubin Urine: NEGATIVE
Glucose, UA: NEGATIVE mg/dL
Hgb urine dipstick: NEGATIVE
Ketones, ur: NEGATIVE mg/dL
Leukocytes,Ua: NEGATIVE
Nitrite: NEGATIVE
Protein, ur: NEGATIVE mg/dL
Specific Gravity, Urine: 1.01 (ref 1.005–1.030)
pH: 6 (ref 5.0–8.0)

## 2023-12-31 LAB — COMPREHENSIVE METABOLIC PANEL WITH GFR
ALT: 24 U/L (ref 0–44)
AST: 21 U/L (ref 15–41)
Albumin: 4.6 g/dL (ref 3.5–5.0)
Alkaline Phosphatase: 62 U/L (ref 38–126)
Anion gap: 12 (ref 5–15)
BUN: 10 mg/dL (ref 6–20)
CO2: 22 mmol/L (ref 22–32)
Calcium: 9.7 mg/dL (ref 8.9–10.3)
Chloride: 104 mmol/L (ref 98–111)
Creatinine, Ser: 0.61 mg/dL (ref 0.44–1.00)
GFR, Estimated: >60 mL/min (ref >60)
Glucose, Bld: 117 mg/dL — ABNORMAL HIGH (ref 70–99)
Potassium: 3.6 mmol/L (ref 3.5–5.1)
Sodium: 137 mmol/L (ref 135–145)
Total Bilirubin: 0.6 mg/dL (ref 0.0–1.2)
Total Protein: 7.7 g/dL (ref 6.5–8.1)

## 2023-12-31 LAB — CBC
HCT: 38.9 % (ref 36.0–46.0)
Hemoglobin: 13 g/dL (ref 12.0–15.0)
MCH: 30.7 pg (ref 26.0–34.0)
MCHC: 33.4 g/dL (ref 30.0–36.0)
MCV: 92 fL (ref 80.0–100.0)
Platelets: 240 K/uL (ref 150–400)
RBC: 4.23 MIL/uL (ref 3.87–5.11)
RDW: 11.9 % (ref 11.5–15.5)
WBC: 6.5 K/uL (ref 4.0–10.5)
nRBC: 0 % (ref 0.0–0.2)

## 2023-12-31 LAB — LIPASE, BLOOD: Lipase: 33 U/L (ref 11–51)

## 2023-12-31 LAB — PREGNANCY, URINE: Preg Test, Ur: NEGATIVE

## 2023-12-31 MED ORDER — MORPHINE SULFATE (PF) 4 MG/ML IV SOLN
4.0000 mg | Freq: Once | INTRAVENOUS | Status: AC
  Administered 2023-12-31: 4 mg via INTRAVENOUS
  Filled 2023-12-31: qty 1

## 2023-12-31 MED ORDER — IOHEXOL 300 MG/ML  SOLN
100.0000 mL | Freq: Once | INTRAMUSCULAR | Status: AC | PRN
  Administered 2023-12-31: 100 mL via INTRAVENOUS

## 2023-12-31 MED ORDER — ONDANSETRON HCL 4 MG/2ML IJ SOLN
4.0000 mg | Freq: Once | INTRAMUSCULAR | Status: AC
  Administered 2023-12-31: 4 mg via INTRAVENOUS
  Filled 2023-12-31: qty 2

## 2023-12-31 MED ORDER — CYCLOBENZAPRINE HCL 10 MG PO TABS: 10.0000 mg | ORAL_TABLET | Freq: Two times a day (BID) | ORAL | 0 refills | Status: AC | PRN

## 2023-12-31 NOTE — ED Notes (Signed)
 Patient transported to CT

## 2023-12-31 NOTE — Discharge Instructions (Addendum)
 Evaluation today for your abdominal pain and back pain was reassuring.  Please follow with your PCP.  You can take Tylenol  and ibuprofen  at home for pain.  If symptoms worsen please return to the ED for further evaluation.  For your back pain you can take Tylenol  and ibuprofen  and I did send some muscle relaxers to your pharmacy as well.

## 2023-12-31 NOTE — ED Provider Notes (Addendum)
 [facility] Provider Note   CSN: [id number] Arrival date & time: 12/31/23  1351     Patient presents with: Back Pain  HPI Carol Rangel is a 38 y.o. female presenting for abdominal pain.  She states it started 2 days ago.  Pain is in the right upper quadrant but radiates to the back as well.  Denies chest pain shortness of breath.  Denies nausea vomiting diarrhea.  Denies urinary or vaginal symptoms.  She is also reporting she has lower back pain as well but reports that she has had that pain since 2021.  Denies saddle anesthesia, urinary or bowel changes or fever or IV drug use.  Past Medical History:  Diagnosis Date   Gestational diabetes    Medical history non-contributory    Nausea with vomiting 12/20/2013   Sciatica of right side 09/24/2019        Back Pain      Prior to Admission medications   Medication Sig Start Date End Date Taking? Authorizing Provider  cyclobenzaprine  (FLEXERIL ) 10 MG tablet Take 1 tablet (10 mg total) by mouth 2 (two) times daily as needed for muscle spasms. 12/31/23  Yes Lang Norleen POUR, PA-C  atorvastatin  (LIPITOR) 40 MG tablet Take 1 tablet (40 mg total) by mouth daily. 09/26/23 12/25/23  Oley Bascom RAMAN, NP  buPROPion  (WELLBUTRIN  XL) 150 MG 24 hr tablet Take 1 tablet (150 mg total) by mouth daily for 14 days. Patient not taking: Reported on 09/26/2023 02/18/23 04/16/23  Nicholaus Almarie HERO, MD  buPROPion  (WELLBUTRIN  XL) 300 MG 24 hr tablet Take 1 tablet (300 mg total) by mouth daily. 02/18/23   Nicholaus Almarie HERO, MD  ibuprofen  (ADVIL ) 600 MG tablet Take 1 tablet (600 mg total) by mouth every 8 (eight) hours as needed (pain). 10/20/23   Vonna Sharlet POUR, MD  meclizine  (ANTIVERT ) 12.5 MG tablet Take 1 tablet (12.5 mg total) by mouth 3 (three) times daily as needed. 04/16/23   Oley Bascom RAMAN, NP  norethindrone  (MICRONOR ) 0.35 MG tablet Take 1 tablet (0.35 mg total) by mouth daily. Take for 21 days, then stop  for 7 days to have a period. Repeat monthly 02/18/23   Nicholaus Almarie HERO, MD  omega-3 acid ethyl esters (LOVAZA ) 1 g capsule Take 2 capsules (2 g total) by mouth daily. 09/26/23 12/25/23  Oley Bascom RAMAN, NP    Allergies: Patient has no known allergies.    Review of Systems  Musculoskeletal:  Positive for back pain.    Updated Vital Signs BP 108/73 (BP Location: Right Arm)   Pulse (!) 51   Temp 98.6 F (37 C)   Resp 14   Wt 68 kg   SpO2 100%   BMI 24.96 kg/m   Physical Exam Vitals and nursing note reviewed.  HENT:     Head: Normocephalic and atraumatic.     Mouth/Throat:     Mouth: Mucous membranes are moist.  Eyes:     General:        Right eye: No discharge.        Left eye: No discharge.     Conjunctiva/sclera: Conjunctivae normal.  Cardiovascular:     Rate and Rhythm: Normal rate and regular rhythm.     Pulses: Normal pulses.     Heart sounds: Normal heart sounds.  Pulmonary:     Effort: Pulmonary effort is normal.     Breath sounds: Normal breath sounds.  Abdominal:     General: Abdomen  is flat.     Palpations: Abdomen is soft.     Tenderness: There is abdominal tenderness in the right upper quadrant.  Musculoskeletal:     Lumbar back: Normal. No swelling, edema, deformity, lacerations, tenderness or bony tenderness. Normal range of motion.  Skin:    General: Skin is warm and dry.  Neurological:     General: No focal deficit present.  Psychiatric:        Mood and Affect: Mood normal.     (all labs ordered are listed, but only abnormal results are displayed) Labs Reviewed  COMPREHENSIVE METABOLIC PANEL WITH GFR - Abnormal; Notable for the following components:      Result Value   Glucose, Bld 117 (*)    All other components within normal limits  URINALYSIS, ROUTINE W REFLEX MICROSCOPIC - Abnormal; Notable for the following components:   Color, Urine STRAW (*)    All other components within normal limits  LIPASE, BLOOD  CBC  PREGNANCY, URINE     EKG: None  Radiology: CT L-SPINE NO CHARGE Result Date: 12/31/2023 EXAM: CT OF THE LUMBAR SPINE WITHOUT CONTRAST 12/31/2023 04:43:17 PM TECHNIQUE: CT of the lumbar spine was performed without the administration of intravenous contrast. Multiplanar reformatted images are provided for review. Automated exposure control, iterative reconstruction, and/or weight based adjustment of the mA/kV was utilized to reduce the radiation dose to as low as reasonably achievable. COMPARISON: None available. CLINICAL HISTORY: Low back pain. FINDINGS: BONES AND ALIGNMENT: Normal vertebral body heights. No acute fracture or suspicious bone lesion. Normal alignment. DEGENERATIVE CHANGES: Minimal disc bulge at L3-L4, without impingement. Shallow left inferior foraminal disc protrusion at L4-5, without impingement. SOFT TISSUES: No acute abnormality. IMPRESSION: 1. Minimal degenerative disc bulge at L3-L4, without impingement. 2. Shallow left inferior foraminal disc protrusion at L4-L5, without impingement. Electronically signed by: Ryan Salvage MD 12/31/2023 05:23 PM EDT RP Workstation: [id number]   CT ABDOMEN PELVIS W CONTRAST Result Date: 12/31/2023 EXAM: CT ABDOMEN AND PELVIS WITH CONTRAST 12/31/2023 04:43:17 PM TECHNIQUE: CT of the abdomen and pelvis was performed with the administration of intravenous contrast. Multiplanar reformatted images are provided for review. Automated exposure control, iterative reconstruction, and/or weight-based adjustment of the mA/kV was utilized to reduce the radiation dose to as low as reasonably achievable. COMPARISON: CT from 08/07/2021 is available for comparison. CLINICAL HISTORY: Abdominal pain. FINDINGS: LOWER CHEST: Chronic left posterior pleural thickening with associated calcifications and mild peripheral adjacent scarring in the left lower lobe posterobasal segment, no change from 08/07/2021. LIVER: The liver is unremarkable. GALLBLADDER AND BILE DUCTS: Gallbladder is  unremarkable. No biliary ductal dilatation. SPLEEN: No acute abnormality. PANCREAS: No acute abnormality. ADRENAL GLANDS: No acute abnormality. KIDNEYS, URETERS AND BLADDER: Small subcentimeter hypodense left renal lesions are too small to characterize but statistically likely to be small benign cysts. No hydronephrosis. No perinephric or periureteral stranding. Urinary bladder is unremarkable. GI AND BOWEL: Stomach demonstrates no acute abnormality. There is no bowel obstruction. PERITONEUM AND RETROPERITONEUM: No ascites. No free air. VASCULATURE: Aorta is normal in caliber. LYMPH NODES: No lymphadenopathy. REPRODUCTIVE ORGANS: No acute abnormality. BONES AND SOFT TISSUES: No acute osseous abnormality. No focal soft tissue abnormality. IMPRESSION: 1. Chronic scarring and pleural thickening posteriorly along the left lung base, no change from 08/07/2021. 2. No acute findings. Electronically signed by: Ryan Salvage MD 12/31/2023 05:20 PM EDT RP Workstation: [id number]     Procedures   Medications Ordered in the ED  ondansetron  (ZOFRAN ) injection 4 mg (4 mg Intravenous Given 12/31/23  1437)  morphine (PF) 4 MG/ML injection 4 mg (4 mg Intravenous Given 12/31/23 1437)  iohexol  (OMNIPAQUE ) 300 MG/ML solution 100 mL (100 mLs Intravenous Contrast Given 12/31/23 1621)                                    Medical Decision Making Amount and/or Complexity of Data Reviewed Labs: ordered. Radiology: ordered.  Risk Prescription drug management.   Initial Impression and Ddx 38 yo well appearing female presenting for abdominal pain and back pain.  Exam notable for right upper quadrant tenderness.  DDx includes acute cholecystitis, kidney stone, appendicitis, cauda equina syndrome, spinal infection, other. Patient PMH that increases complexity of ED encounter:  sciatica  Interpretation of Diagnostics - I independent reviewed and interpreted the labs as followed: none  - I independently visualized  the following imaging with scope of interpretation limited to determining acute life threatening conditions related to emergency care: CT ab/pelvis, which revealed no acute findings, CT lumbar spine showed degenerative changes  Patient Reassessment and Ultimate Disposition/Management On assessment, symptoms had improved.  Workup unremarkable here.  Advised PCP follow-up.  Discussed return precautions.  Discharged.  Sent muscle relaxers to her pharmacy at her request for back pain.  Patient management required discussion with the following services or consulting groups:  None  Complexity of Problems Addressed Acute complicated illness or Injury  Additional Data Reviewed and Analyzed Further history obtained from: Past medical history and medications listed in the EMR and Prior ED visit notes  Patient Encounter Risk Assessment Consideration of hospitalization      Final diagnoses:  Abdominal pain, unspecified abdominal location  Low back pain, unspecified back pain laterality, unspecified chronicity, unspecified whether sciatica present    ED Discharge Orders          Ordered    cyclobenzaprine  (FLEXERIL ) 10 MG tablet  2 times daily PRN        12/31/23 1743               Lang Norleen POUR, PA-C 12/31/23 1738    Lang Norleen POUR, PA-C 12/31/23 1744    Armenta Canning, MD 01/03/24 1424

## 2023-12-31 NOTE — ED Notes (Signed)
 Provider at the bedside.

## 2023-12-31 NOTE — ED Triage Notes (Signed)
 BIB [facility] , pt  from home .  Back papin x 2 days , radiating to URQ . No NVD . No relief with tylenol  .   Pt denies urinary symptoms , yet reports kidney issues .

## 2023-12-31 NOTE — ED Notes (Signed)
 Offered but pt said she will try again later

## 2023-12-31 NOTE — ED Notes (Signed)
RN provided AVS using Teachback Method. Patient verbalizes understanding of Discharge Instructions. Opportunity for Questioning and Answers were provided by RN. Patient Discharged from ED ambulatory to home with family. ? ?

## 2024-03-29 ENCOUNTER — Encounter: Payer: Self-pay | Admitting: Nurse Practitioner

## 2024-03-29 ENCOUNTER — Ambulatory Visit (INDEPENDENT_AMBULATORY_CARE_PROVIDER_SITE_OTHER): Payer: Self-pay | Admitting: Nurse Practitioner

## 2024-03-29 VITALS — BP 117/73 | HR 73 | Temp 97.8°F | Wt 160.4 lb

## 2024-03-29 DIAGNOSIS — R350 Frequency of micturition: Secondary | ICD-10-CM | POA: Diagnosis not present

## 2024-03-29 DIAGNOSIS — N390 Urinary tract infection, site not specified: Secondary | ICD-10-CM | POA: Diagnosis not present

## 2024-03-29 DIAGNOSIS — Z1329 Encounter for screening for other suspected endocrine disorder: Secondary | ICD-10-CM

## 2024-03-29 DIAGNOSIS — Z13220 Encounter for screening for lipoid disorders: Secondary | ICD-10-CM

## 2024-03-29 LAB — POCT URINALYSIS DIP (CLINITEK)
Bilirubin, UA: NEGATIVE
Glucose, UA: NEGATIVE mg/dL
Ketones, POC UA: NEGATIVE mg/dL
Nitrite, UA: NEGATIVE
POC PROTEIN,UA: NEGATIVE
Spec Grav, UA: 1.025
Urobilinogen, UA: 0.2 U/dL
pH, UA: 5.5

## 2024-03-29 MED ORDER — SULFAMETHOXAZOLE-TRIMETHOPRIM 800-160 MG PO TABS: 1.0000 | ORAL_TABLET | Freq: Two times a day (BID) | ORAL | 0 refills | Status: AC

## 2024-03-29 NOTE — Progress Notes (Signed)
 "  Subjective   Patient ID: Carol Rangel, female    DOB: [birth year], 39 y.o.   MRN: [id number]  Chief Complaint  Patient presents with   Follow-up    6 month f/u for UTI. Frequent urination is not improved. Still pain when having period.     Referring provider: Oley Bascom RAMAN, NP  Doralyn Kirkes is a 39 y.o. female with Past Medical History: No date: Gestational diabetes No date: Medical history non-contributory 12/20/2013: Nausea with vomiting 09/24/2019: Sciatica of right side   HPI  Patient presents today for follow-up.  She has followed with gynecology and did have physical therapy for pelvic floor pain.  She does have UTI symptoms today and does have leukocytes in the urine.  Will order Bactrim .  Will place referral for urology.  Patient was told that she does have some bladder prolapse. Denies f/c/s, n/v/d, hemoptysis, PND, leg swelling Denies chest pain or edema     Allergies[1]  Immunization History  Administered Date(s) Administered   Tdap 10/28/2019, 01/31/2021    Tobacco History: Tobacco Use History[2] Counseling given: Not Answered   Outpatient Encounter Medications as of 03/29/2024  Medication Sig   ibuprofen  (ADVIL ) 600 MG tablet Take 1 tablet (600 mg total) by mouth every 8 (eight) hours as needed (pain).   meclizine  (ANTIVERT ) 12.5 MG tablet Take 1 tablet (12.5 mg total) by mouth 3 (three) times daily as needed.   sulfamethoxazole -trimethoprim  (BACTRIM  DS) 800-160 MG tablet Take 1 tablet by mouth 2 (two) times daily for 3 days.   atorvastatin  (LIPITOR) 40 MG tablet Take 1 tablet (40 mg total) by mouth daily. (Patient not taking: Reported on 03/29/2024)   buPROPion  (WELLBUTRIN  XL) 150 MG 24 hr tablet Take 1 tablet (150 mg total) by mouth daily for 14 days. (Patient not taking: Reported on 03/29/2024)   buPROPion  (WELLBUTRIN  XL) 300 MG 24 hr tablet Take 1 tablet (300 mg total) by mouth daily. (Patient not taking: Reported on 03/29/2024)   cyclobenzaprine   (FLEXERIL ) 10 MG tablet Take 1 tablet (10 mg total) by mouth 2 (two) times daily as needed for muscle spasms. (Patient not taking: Reported on 03/29/2024)   norethindrone  (MICRONOR ) 0.35 MG tablet Take 1 tablet (0.35 mg total) by mouth daily. Take for 21 days, then stop for 7 days to have a period. Repeat monthly (Patient not taking: Reported on 03/29/2024)   omega-3 acid ethyl esters (LOVAZA ) 1 g capsule Take 2 capsules (2 g total) by mouth daily. (Patient not taking: Reported on 03/29/2024)   No facility-administered encounter medications on file as of 03/29/2024.    Review of Systems  Review of Systems  Constitutional: Negative.   HENT: Negative.    Cardiovascular: Negative.   Gastrointestinal: Negative.   Allergic/Immunologic: Negative.   Neurological: Negative.   Psychiatric/Behavioral: Negative.       Objective:   BP 117/73   Pulse 73   Temp 97.8 F (36.6 C) (Temporal)   Wt 160 lb 6.4 oz (72.8 kg)   LMP 03/01/2024 (Approximate)   SpO2 99%   BMI 26.69 kg/m   Wt Readings from Last 5 Encounters:  03/29/24 160 lb 6.4 oz (72.8 kg)  12/31/23 150 lb (68 kg)  09/26/23 148 lb (67.1 kg)  09/09/23 150 lb (68 kg)  04/16/23 154 lb 9.6 oz (70.1 kg)     Physical Exam Vitals and nursing note reviewed.  Constitutional:      General: She is not in acute distress.    Appearance: She is well-developed.  Cardiovascular:     Rate and Rhythm: Normal rate and regular rhythm.  Pulmonary:     Effort: Pulmonary effort is normal.     Breath sounds: Normal breath sounds.  Neurological:     Mental Status: She is alert and oriented to person, place, and time.       Assessment & Plan:   Frequency of urination -     POCT URINALYSIS DIP (CLINITEK) -     Ambulatory referral to Urology -     Urine Culture -     CBC -     Comprehensive metabolic panel with GFR  Frequent UTI -     Ambulatory referral to Urology -     Urine Culture  Urinary tract infection without hematuria, site  unspecified -     Sulfamethoxazole -Trimethoprim ; Take 1 tablet by mouth 2 (two) times daily for 3 days.  Dispense: 6 tablet; Refill: 0  Thyroid  disorder screen -     TSH  Lipid screening -     Lipid panel     Return in about 6 months (around 09/26/2024), or if symptoms worsen or fail to improve.   Bascom GORMAN Borer, NP 03/29/2024     [1] No Known Allergies [2]  Social History Tobacco Use  Smoking Status Never  Smokeless Tobacco Never   "

## 2024-03-30 LAB — COMPREHENSIVE METABOLIC PANEL WITH GFR
ALT: 38 IU/L — ABNORMAL HIGH (ref 0–32)
AST: 23 IU/L (ref 0–40)
Albumin: 4.8 g/dL (ref 3.9–4.9)
Alkaline Phosphatase: 72 IU/L (ref 41–116)
BUN/Creatinine Ratio: 21 (ref 9–23)
BUN: 14 mg/dL (ref 6–24)
Bilirubin Total: 0.4 mg/dL (ref 0.0–1.2)
CO2: 18 mmol/L — ABNORMAL LOW (ref 20–29)
Calcium: 10.1 mg/dL (ref 8.7–10.2)
Chloride: 104 mmol/L (ref 96–106)
Creatinine, Ser: 0.68 mg/dL (ref 0.57–1.00)
Globulin, Total: 3 g/dL (ref 1.5–4.5)
Glucose: 108 mg/dL — ABNORMAL HIGH (ref 70–99)
Potassium: 4 mmol/L (ref 3.5–5.2)
Sodium: 139 mmol/L (ref 134–144)
Total Protein: 7.8 g/dL (ref 6.0–8.5)
eGFR: 113 mL/min/1.73

## 2024-03-30 LAB — CBC
Hematocrit: 42.4 % (ref 34.0–46.6)
Hemoglobin: 13.8 g/dL (ref 11.1–15.9)
MCH: 30.7 pg (ref 26.6–33.0)
MCHC: 32.5 g/dL (ref 31.5–35.7)
MCV: 94 fL (ref 79–97)
Platelets: 257 x10E3/uL (ref 150–450)
RBC: 4.5 x10E6/uL (ref 3.77–5.28)
RDW: 11.6 % — ABNORMAL LOW (ref 11.7–15.4)
WBC: 8.3 x10E3/uL (ref 3.4–10.8)

## 2024-03-30 LAB — LIPID PANEL
Chol/HDL Ratio: 6.1 ratio — ABNORMAL HIGH (ref 0.0–4.4)
Cholesterol, Total: 226 mg/dL — ABNORMAL HIGH (ref 100–199)
HDL: 37 mg/dL — ABNORMAL LOW
LDL Chol Calc (NIH): 152 mg/dL — ABNORMAL HIGH (ref 0–99)
Triglycerides: 200 mg/dL — ABNORMAL HIGH (ref 0–149)
VLDL Cholesterol Cal: 37 mg/dL (ref 5–40)

## 2024-03-30 LAB — TSH: TSH: 5.41 u[IU]/mL — ABNORMAL HIGH (ref 0.450–4.500)

## 2024-03-31 ENCOUNTER — Ambulatory Visit: Payer: Self-pay | Admitting: Nurse Practitioner

## 2024-03-31 LAB — URINE CULTURE

## 2024-03-31 MED ORDER — LEVOTHYROXINE SODIUM 25 MCG PO TABS: 25.0000 ug | ORAL_TABLET | Freq: Every day | ORAL | 3 refills | Status: AC

## 2024-09-27 ENCOUNTER — Ambulatory Visit: Payer: Self-pay | Admitting: Nurse Practitioner
